# Patient Record
Sex: Female | Born: 1965
Health system: Southern US, Community
[De-identification: ages and names within clinical notes are randomized; demographics above are authoritative.]

## PROBLEM LIST (undated history)

## (undated) DIAGNOSIS — I10 Essential (primary) hypertension: Secondary | ICD-10-CM

## (undated) DIAGNOSIS — J329 Chronic sinusitis, unspecified: Secondary | ICD-10-CM

## (undated) DIAGNOSIS — M549 Dorsalgia, unspecified: Secondary | ICD-10-CM

---

## 2005-09-30 ENCOUNTER — Emergency Department (HOSPITAL_COMMUNITY): Admission: EM | Admit: 2005-09-30 | Discharge: 2005-10-01 | Payer: Self-pay | Admitting: Emergency Medicine

## 2005-10-03 ENCOUNTER — Emergency Department (HOSPITAL_COMMUNITY): Admission: EM | Admit: 2005-10-03 | Discharge: 2005-10-03 | Payer: Self-pay | Admitting: Emergency Medicine

## 2005-10-05 ENCOUNTER — Inpatient Hospital Stay (HOSPITAL_COMMUNITY): Admission: AD | Admit: 2005-10-05 | Discharge: 2005-10-05 | Payer: Self-pay | Admitting: Obstetrics & Gynecology

## 2005-10-14 ENCOUNTER — Inpatient Hospital Stay (HOSPITAL_COMMUNITY): Admission: AD | Admit: 2005-10-14 | Discharge: 2005-10-14 | Payer: Self-pay | Admitting: Family Medicine

## 2007-05-03 ENCOUNTER — Encounter (INDEPENDENT_AMBULATORY_CARE_PROVIDER_SITE_OTHER): Payer: Self-pay | Admitting: Nurse Practitioner

## 2007-05-03 ENCOUNTER — Ambulatory Visit: Payer: Self-pay | Admitting: *Deleted

## 2007-05-03 ENCOUNTER — Ambulatory Visit: Payer: Self-pay | Admitting: Internal Medicine

## 2007-05-03 LAB — CONVERTED CEMR LAB
Bilirubin Urine: NEGATIVE
Hemoglobin, Urine: NEGATIVE
Ketones, ur: NEGATIVE mg/dL
Leukocytes, UA: NEGATIVE
Nitrite: NEGATIVE
Protein, ur: NEGATIVE mg/dL
Specific Gravity, Urine: 1.01
Urine Glucose: NEGATIVE mg/dL
Urobilinogen, UA: 0.2
pH: 7

## 2007-07-29 ENCOUNTER — Telehealth (INDEPENDENT_AMBULATORY_CARE_PROVIDER_SITE_OTHER): Payer: Self-pay | Admitting: *Deleted

## 2007-07-29 ENCOUNTER — Encounter (INDEPENDENT_AMBULATORY_CARE_PROVIDER_SITE_OTHER): Payer: Self-pay | Admitting: Nurse Practitioner

## 2007-08-02 ENCOUNTER — Encounter (INDEPENDENT_AMBULATORY_CARE_PROVIDER_SITE_OTHER): Payer: Self-pay | Admitting: Nurse Practitioner

## 2007-08-02 DIAGNOSIS — M545 Low back pain, unspecified: Secondary | ICD-10-CM | POA: Insufficient documentation

## 2008-05-04 ENCOUNTER — Emergency Department (HOSPITAL_COMMUNITY): Admission: EM | Admit: 2008-05-04 | Discharge: 2008-05-04 | Payer: Self-pay | Admitting: Family Medicine

## 2008-06-11 ENCOUNTER — Emergency Department (HOSPITAL_COMMUNITY): Admission: EM | Admit: 2008-06-11 | Discharge: 2008-06-11 | Payer: Self-pay | Admitting: Family Medicine

## 2008-08-11 ENCOUNTER — Emergency Department (HOSPITAL_COMMUNITY): Admission: EM | Admit: 2008-08-11 | Discharge: 2008-08-11 | Payer: Self-pay | Admitting: Emergency Medicine

## 2008-11-09 ENCOUNTER — Emergency Department (HOSPITAL_COMMUNITY): Admission: EM | Admit: 2008-11-09 | Discharge: 2008-11-09 | Payer: Self-pay | Admitting: Emergency Medicine

## 2008-12-07 ENCOUNTER — Emergency Department (HOSPITAL_COMMUNITY): Admission: EM | Admit: 2008-12-07 | Discharge: 2008-12-07 | Payer: Self-pay | Admitting: Emergency Medicine

## 2008-12-15 ENCOUNTER — Ambulatory Visit: Payer: Self-pay | Admitting: Nurse Practitioner

## 2008-12-15 DIAGNOSIS — M19019 Primary osteoarthritis, unspecified shoulder: Secondary | ICD-10-CM | POA: Insufficient documentation

## 2008-12-15 DIAGNOSIS — I10 Essential (primary) hypertension: Secondary | ICD-10-CM | POA: Insufficient documentation

## 2008-12-26 ENCOUNTER — Ambulatory Visit (HOSPITAL_COMMUNITY): Admission: RE | Admit: 2008-12-26 | Discharge: 2008-12-26 | Payer: Self-pay | Admitting: Nurse Practitioner

## 2010-12-31 ENCOUNTER — Ambulatory Visit (INDEPENDENT_AMBULATORY_CARE_PROVIDER_SITE_OTHER): Payer: Self-pay

## 2010-12-31 ENCOUNTER — Inpatient Hospital Stay (INDEPENDENT_AMBULATORY_CARE_PROVIDER_SITE_OTHER)
Admission: RE | Admit: 2010-12-31 | Discharge: 2010-12-31 | Disposition: A | Discharge status: Home / Self Care | Payer: Self-pay | Source: Ambulatory Visit | Attending: Family Medicine | Admitting: Family Medicine

## 2010-12-31 DIAGNOSIS — J069 Acute upper respiratory infection, unspecified: Secondary | ICD-10-CM

## 2011-10-18 ENCOUNTER — Emergency Department (HOSPITAL_COMMUNITY)
Admission: EM | Admit: 2011-10-18 | Discharge: 2011-10-18 | Disposition: A | Discharge status: Home / Self Care | Payer: Self-pay | Source: Home / Self Care | Attending: Emergency Medicine | Admitting: Emergency Medicine

## 2011-10-18 ENCOUNTER — Encounter: Payer: Self-pay | Admitting: *Deleted

## 2011-10-18 DIAGNOSIS — I10 Essential (primary) hypertension: Secondary | ICD-10-CM

## 2011-10-18 DIAGNOSIS — J329 Chronic sinusitis, unspecified: Secondary | ICD-10-CM

## 2011-10-18 HISTORY — DX: Chronic sinusitis, unspecified: J32.9

## 2011-10-18 HISTORY — DX: Essential (primary) hypertension: I10

## 2011-10-18 HISTORY — DX: Dorsalgia, unspecified: M54.9

## 2011-10-18 MED ORDER — GUAIFENESIN ER 600 MG PO TB12: 600.0000 mg | ORAL_TABLET | Freq: Two times a day (BID) | ORAL | Status: AC

## 2011-10-18 MED ORDER — IBUPROFEN 600 MG PO TABS: 600.0000 mg | ORAL_TABLET | Freq: Four times a day (QID) | ORAL | Status: AC | PRN

## 2011-10-18 MED ORDER — FLUTICASONE PROPIONATE 50 MCG/ACT NA SUSP: 2.0000 | Freq: Every day | NASAL | Status: DC

## 2011-10-18 MED ORDER — DOXYCYCLINE HYCLATE 100 MG PO CAPS: 100.0000 mg | ORAL_CAPSULE | Freq: Two times a day (BID) | ORAL | Status: AC

## 2011-10-18 NOTE — ED Notes (Signed)
Pt is here with complaints of 5 day history of non-productive cough, fever, HA, runny nose and eye drainage.

## 2011-10-18 NOTE — ED Provider Notes (Addendum)
History     CSN: [id number]  Arrival date & time 10/18/11  1334   First MD Initiated Contact with Patient 10/18/11 1409      Chief Complaint  Patient presents with  . Headache  . Cough  . Nasal Congestion  . Fever    HPI Comments: Pt with nasal congestion, postnasal drip, ST, nonproductive cough, bilateral  frontal sinus pain/pressure worse with bending forward/lying down x 5 days. C/o constant frontal nonradiating HA.  States feels "hot" but no documented fevers at home. No ear fullness. No  N/V, other HA, bodyaches, dental pain, purulent nasal d/c. Took 200 mg motrin yesterday with some relief. States feels simlar to prev episodes of sinusitis.    The history is provided by the patient and the spouse. The history is limited by a language barrier. No language interpreter was used.    Past Medical History  Diagnosis Date  . Sinusitis   . Hypertension   . Back pain     Past Surgical History  Procedure Date  . Cesarean section     History reviewed. No pertinent family history.  History  Substance Use Topics  . Smoking status: Never Smoker   . Smokeless tobacco: Not on file  . Alcohol Use: No    OB History    Grav Para Term Preterm Abortions TAB SAB Ect Mult Living                  Review of Systems  Constitutional: Negative for fever.  HENT: Positive for congestion, sore throat, rhinorrhea, postnasal drip and sinus pressure. Negative for ear pain, nosebleeds, trouble swallowing, dental problem and voice change.   Eyes: Negative for photophobia.  Respiratory: Positive for cough. Negative for shortness of breath and wheezing.   Gastrointestinal: Negative for nausea and vomiting.  Musculoskeletal: Negative for myalgias.  Skin: Negative for rash.  Neurological: Positive for headaches. Negative for speech difficulty, weakness and numbness.    Allergies  Review of patient's allergies indicates no known allergies.  Home Medications   Current Outpatient Rx    Name Route Sig Dispense Refill  . DOXYCYCLINE HYCLATE 100 MG PO CAPS Oral Take 1 capsule (100 mg total) by mouth 2 (two) times daily. X 10 days 20 capsule 0  . FLUTICASONE PROPIONATE 50 MCG/ACT NA SUSP Nasal Place 2 sprays into the nose daily. 16 g 0  . GUAIFENESIN ER 600 MG PO TB12 Oral Take 1 tablet (600 mg total) by mouth 2 (two) times daily. 14 tablet 0  . IBUPROFEN 600 MG PO TABS Oral Take 1 tablet (600 mg total) by mouth every 6 (six) hours as needed for pain. 30 tablet 0    BP 161/85  Pulse 75  Temp(Src) 98.1 F (36.7 C) (Oral)  Resp 18  SpO2 100%  LMP 06/04/2011  Physical Exam  Nursing note and vitals reviewed. Constitutional: She is oriented to person, place, and time. She appears well-developed and well-nourished. No distress.  HENT:  Head: Normocephalic and atraumatic.  Right Ear: Tympanic membrane normal.  Left Ear: Tympanic membrane normal.  Nose: Mucosal edema and rhinorrhea present. No epistaxis. Right sinus exhibits maxillary sinus tenderness and frontal sinus tenderness. Left sinus exhibits maxillary sinus tenderness and frontal sinus tenderness.  Mouth/Throat: Oropharynx is clear and moist and mucous membranes are normal.  Eyes: EOM are normal. Pupils are equal, round, and reactive to light.  Neck: Normal range of motion.  Cardiovascular: Normal rate, regular rhythm, normal heart sounds and intact distal pulses.  Pulmonary/Chest: Effort normal and breath sounds normal.  Abdominal: Bowel sounds are normal. She exhibits no distension.  Musculoskeletal: Normal range of motion.  Lymphadenopathy:    She has no cervical adenopathy.  Neurological: She is alert and oriented to person, place, and time.  Skin: Skin is warm and dry.  Psychiatric: She has a normal mood and affect. Her behavior is normal. Judgment and thought content normal.    ED Course  Procedures (including critical care time)  Labs Reviewed - No data to display No results found.   1. Sinusitis    2. Hypertension       MDM  Previous chart reviewed. Seen in jan 2012 for HTN BP 140's/100. Was sent home with HCTZ/.lisinopril. Pt states ran out of this medication and has not taken it since early Feb. Pt hypertensive today. Pt has no evidence of end organ damage. Pt denies any CNS type sx such as visual changes, focal paresis, or new onset seizure activity. Pt denies any CV sx such as CP, dyspnea, palpitations, pedal edema, tearing pain radiating to back or abd. Pt denied any renal sx such as anuria or hematuria. Pt denies illicit drug use, most notably cocaine, or recent use of OTC medications such as nasal decongestants. Discussed importance of lifestyle modifications as important first steps, and the importance of taking  BP medications as rx'd to her by her PMD. Discussed this extensively with pt and husband. Pt to f/u as OP.    txing sinusitis with flonase, mucinex, saline nasal irrigation. will send home with abx but advised to not fill unless has had this for 10 days, does not get better with current mgmt or  develops a fever. Pt and husband agree with plan   Luiz Blare, MD 10/18/11 1610  Luiz Blare, MD 10/18/11 2303

## 2012-10-15 ENCOUNTER — Telehealth (HOSPITAL_COMMUNITY): Payer: Self-pay

## 2012-10-15 ENCOUNTER — Emergency Department (HOSPITAL_COMMUNITY)
Admission: EM | Admit: 2012-10-15 | Discharge: 2012-10-15 | Disposition: A | Discharge status: Home / Self Care | Payer: Self-pay | Source: Home / Self Care

## 2012-10-15 ENCOUNTER — Encounter (HOSPITAL_COMMUNITY): Payer: Self-pay

## 2012-10-15 DIAGNOSIS — I10 Essential (primary) hypertension: Secondary | ICD-10-CM

## 2012-10-15 DIAGNOSIS — J309 Allergic rhinitis, unspecified: Secondary | ICD-10-CM

## 2012-10-15 LAB — COMPREHENSIVE METABOLIC PANEL
AST: 18 U/L (ref 0–37)
Albumin: 4 g/dL (ref 3.5–5.2)
Alkaline Phosphatase: 75 U/L (ref 39–117)
BUN: 10 mg/dL (ref 6–23)
CO2: 30 mEq/L (ref 19–32)
Calcium: 10.4 mg/dL (ref 8.4–10.5)
Chloride: 99 mEq/L (ref 96–112)
Creatinine, Ser: 0.61 mg/dL (ref 0.50–1.10)
GFR calc Af Amer: >90 mL/min (ref >90)
GFR calc non Af Amer: >90 mL/min (ref >90)
Glucose, Bld: 91 mg/dL (ref 70–99)
Potassium: 3.5 mEq/L (ref 3.5–5.1)
Total Bilirubin: 0.2 mg/dL — ABNORMAL LOW (ref 0.3–1.2)
Total Protein: 8 g/dL (ref 6.0–8.3)

## 2012-10-15 MED ORDER — LORATADINE 10 MG PO TABS: 10.0000 mg | ORAL_TABLET | Freq: Every day | ORAL | Status: DC

## 2012-10-15 MED ORDER — LISINOPRIL-HYDROCHLOROTHIAZIDE 10-12.5 MG PO TABS: 1.0000 | ORAL_TABLET | Freq: Every day | ORAL | Status: DC

## 2012-10-15 NOTE — ED Notes (Signed)
Patient states has been running a fever since yesterday. Has cough congestion has vomited twice at night currently using OTC cough and flu medications with little relief

## 2012-10-15 NOTE — ED Provider Notes (Signed)
History     CSN: [id number]  Arrival date & time 10/15/12  1018   First MD Initiated Contact with Patient 10/15/12 1115      Chief Complaint  Patient presents with  . Fever    (Consider location/radiation/quality/duration/timing/severity/associated sxs/prior treatment) HPI 46 year old female who came to the clinic today with chief complaint of upper respiratory symptoms with stuffy nose and cough. She admits to having fever but temperature today is 98.4. Patient's blood pressure is elevated to 162 /103. She has not been taking her medications as she is noncompliant . Last note from 2012 show that she was prescribed lisinopril/hydrochlorothiazide but patient says that she stopped taking it after one month. She denies any chest pain admits to having some shortness of breath. Past Medical History  Diagnosis Date  . Sinusitis   . Hypertension   . Back pain     Past Surgical History  Procedure Date  . Cesarean section     No family history on file.  History  Substance Use Topics  . Smoking status: Never Smoker   . Smokeless tobacco: Not on file  . Alcohol Use: No    OB History    Grav Para Term Preterm Abortions TAB SAB Ect Mult Living                  Review of Systems Chest: Chest pain, posterior shortness of breath GI: Episode of nausea, vomiting last night HEENT: See history of present illness Allergies  Review of patient's allergies indicates no known allergies.  Home Medications   Current Outpatient Rx  Name  Route  Sig  Dispense  Refill  . FLUTICASONE PROPIONATE 50 MCG/ACT NA SUSP   Nasal   Place 2 sprays into the nose daily.   16 g   0   . GUAIFENESIN ER 600 MG PO TB12   Oral   Take 1 tablet (600 mg total) by mouth 2 (two) times daily.   14 tablet   0   . LISINOPRIL-HYDROCHLOROTHIAZIDE 10-12.5 MG PO TABS   Oral   Take 1 tablet by mouth daily.   30 tablet   2   . LORATADINE 10 MG PO TABS   Oral   Take 1 tablet (10 mg total) by mouth  daily.   30 tablet   0     BP 162/103  Pulse 78  Temp 98.4 F (36.9 C) (Oral)  Resp 21  SpO2 100%  Physical Exam  Constitutional:   Patient is a well-developed and well-nourished female him in no acute distress and cooperative with exam. Head: Normocephalic and atraumatic Mouth: Mucus membranes moist Eyes: PERRL, EOMI, conjunctivae normal Cardiovascular: RRR, S1 normal, S2 normal Pulmonary/Chest: CTAB, no wheezes, rales, or rhonchi Abdominal: Soft. Non-tender, non-distended, bowel sounds are normal, no masses, organomegaly, or guarding present.  Neurological: A&O x3, Strenght is normal and symmetric bilaterally, cranial nerve II-XII are grossly intact, no focal motor deficit, sensory intact to light touch bilaterally.  Extremities : No Cyanosis, Clubbing or Edema   ED Course  Procedures (including critical care time)   Labs Reviewed  CBC WITH DIFFERENTIAL  COMPREHENSIVE METABOLIC PANEL   No results found.   1. HYPERTENSION, BENIGN ESSENTIAL   2. Allergic rhinitis    Hypertension  Patient seems to be noncompliant as she has not taken the medication for last one year. Will restart her back on lisinopril/hydrochlorothiazide at those of 10/2.5 mg by mouth daily. We'll also obtain CMP and CBC and will see the  patient back in one week for the followup of lab results and hypertension.  Allergic rhinitis Patient at this time seems to have allergic rhinitis. We'll prescribe Claritin 10 mg by mouth daily.  Fever Patient was afebrile in the clinic today, however obtain CBC and inform the patient if CBC is elevated. Patient was unable to tell how high was a fever, and only has a feeling of subjective fever. She does not appear toxic at this time.  We'll see her back in one week   MDM          Meredeth Ide, MD 10/15/12 1144

## 2012-10-18 LAB — CBC WITH DIFFERENTIAL/PLATELET
Eosinophils Relative: 3 % (ref 0–5)
HCT: 38.1 % (ref 36.0–46.0)
Hemoglobin: 12.3 g/dL (ref 12.0–15.0)
Lymphocytes Relative: 46 % (ref 12–46)
Lymphs Abs: 1.8 10*3/uL (ref 0.7–4.0)
MCH: 25.7 pg — ABNORMAL LOW (ref 26.0–34.0)
MCHC: 32.3 g/dL (ref 30.0–36.0)
MCV: 79.5 fL (ref 78.0–100.0)
Monocytes Absolute: 0.2 10*3/uL (ref 0.1–1.0)
Monocytes Relative: 5 % (ref 3–12)
Neutro Abs: 1.7 10*3/uL (ref 1.7–7.7)
Platelets: 257 10*3/uL (ref 150–400)
RBC: 4.79 MIL/uL (ref 3.87–5.11)
RDW: 14.4 % (ref 11.5–15.5)
WBC: 3.9 10*3/uL — ABNORMAL LOW (ref 4.0–10.5)

## 2012-10-18 NOTE — ED Notes (Signed)
Pt returned to office today for re draw of cbc her tube had clotted before it had gotten to lab

## 2012-10-29 ENCOUNTER — Encounter (HOSPITAL_COMMUNITY): Payer: Self-pay

## 2012-10-29 ENCOUNTER — Emergency Department (INDEPENDENT_AMBULATORY_CARE_PROVIDER_SITE_OTHER)
Admission: EM | Admit: 2012-10-29 | Discharge: 2012-10-29 | Disposition: A | Discharge status: Home / Self Care | Payer: Self-pay | Source: Home / Self Care

## 2012-10-29 DIAGNOSIS — R05 Cough: Secondary | ICD-10-CM

## 2012-10-29 DIAGNOSIS — J329 Chronic sinusitis, unspecified: Secondary | ICD-10-CM

## 2012-10-29 DIAGNOSIS — J309 Allergic rhinitis, unspecified: Secondary | ICD-10-CM

## 2012-10-29 DIAGNOSIS — H6590 Unspecified nonsuppurative otitis media, unspecified ear: Secondary | ICD-10-CM

## 2012-10-29 DIAGNOSIS — I10 Essential (primary) hypertension: Secondary | ICD-10-CM

## 2012-10-29 MED ORDER — FLUTICASONE PROPIONATE 50 MCG/ACT NA SUSP: 2.0000 | Freq: Every day | NASAL | Status: DC

## 2012-10-29 MED ORDER — DEXTROMETHORPHAN POLISTIREX 30 MG/5ML PO LQCR: 60.0000 mg | ORAL | Status: DC | PRN

## 2012-10-29 MED ORDER — CETIRIZINE HCL 10 MG PO TABS: 10.0000 mg | ORAL_TABLET | Freq: Every day | ORAL | Status: DC

## 2012-10-29 MED ORDER — AMOXICILLIN 875 MG PO TABS: 875.0000 mg | ORAL_TABLET | Freq: Two times a day (BID) | ORAL | Status: DC

## 2012-10-29 NOTE — ED Provider Notes (Signed)
History   CSN: [id number]  Arrival date & time 10/29/12  1043  Chief Complaint  Patient presents with  . URI   Patient is a 47 y.o. female presenting with URI.  URI The primary symptoms include fever, ear pain and cough.  Symptoms associated with the illness include congestion and rhinorrhea.   Pt having persistent cough and congestion, chest and nasal, yellow sputum production, sinus headache, blood tinged sputum, fever, some malaise and weakness, no nausea or vomiting or chest pain.    Past Medical History  Diagnosis Date  . Sinusitis   . Hypertension   . Back pain     Past Surgical History  Procedure Date  . Cesarean section    No family history on file.  History  Substance Use Topics  . Smoking status: Never Smoker   . Smokeless tobacco: Not on file  . Alcohol Use: No    OB History    Grav Para Term Preterm Abortions TAB SAB Ect Mult Living                 Review of Systems  Constitutional: Positive for fever.  HENT: Positive for ear pain, congestion, rhinorrhea, sneezing and postnasal drip.   Respiratory: Positive for cough.   All other systems reviewed and are negative.   Allergies  Review of patient's allergies indicates no known allergies.  Home Medications   Current Outpatient Rx  Name  Route  Sig  Dispense  Refill  . FLUTICASONE PROPIONATE 50 MCG/ACT NA SUSP   Nasal   Place 2 sprays into the nose daily.   16 g   0   . LISINOPRIL-HYDROCHLOROTHIAZIDE 10-12.5 MG PO TABS   Oral   Take 1 tablet by mouth daily.   30 tablet   2   . LORATADINE 10 MG PO TABS   Oral   Take 1 tablet (10 mg total) by mouth daily.   30 tablet   0     BP 149/71  Pulse 70  Temp 98.2 F (36.8 C) (Oral)  Resp 19  SpO2 100%  Physical Exam  Nursing note and vitals reviewed. Constitutional: She is oriented to person, place, and time. She appears well-developed and well-nourished.  HENT:  Head: Normocephalic and atraumatic.  Right Ear: A middle ear effusion  is present.  Left Ear: A middle ear effusion is present.  Nose: Mucosal edema and rhinorrhea present. Right sinus exhibits frontal sinus tenderness. Left sinus exhibits frontal sinus tenderness.  Neck: Normal range of motion. Neck supple. No thyromegaly present.  Cardiovascular: Normal rate, regular rhythm and normal heart sounds.   Pulmonary/Chest: Effort normal. No respiratory distress. She has no wheezes. She has no rales.  Abdominal: Soft. Bowel sounds are normal.  Musculoskeletal: Normal range of motion.  Neurological: She is alert and oriented to person, place, and time.  Skin: Skin is warm and dry.  Psychiatric: She has a normal mood and affect. Her behavior is normal. Judgment and thought content normal.    ED Course  Procedures (including critical care time)  Labs Reviewed - No data to display No results found.  No diagnosis found.  MDM  IMPRESSION  Sinusitis  Allergic Rhinitis uncontrolled  Cough and chest congestion  HTN suboptimally controlled  RECOMMENDATIONS / PLAN Amoxicillin 875 mg po bid flonase Nasal Spray  Start cetirizine 10 mg daily  Stop taking loratadine I reviewed lab work results with patient   FOLLOW UP 3 weeks followup blood pressure  The patient was given  clear instructions to go to ER or return to medical center if symptoms don't improve, worsen or new problems develop.  The patient verbalized understanding.  The patient was told to call to get lab results if they haven't heard anything in the next week.            Cleora Fleet, MD 10/29/12 [phone]

## 2012-10-29 NOTE — ED Notes (Signed)
Complain of cold cough fever increased mucus.  Re check blood pressure as well.

## 2012-11-23 ENCOUNTER — Emergency Department (INDEPENDENT_AMBULATORY_CARE_PROVIDER_SITE_OTHER)
Admission: EM | Admit: 2012-11-23 | Discharge: 2012-11-23 | Disposition: A | Discharge status: Home / Self Care | Payer: No Typology Code available for payment source | Source: Home / Self Care | Attending: Family Medicine | Admitting: Family Medicine

## 2012-11-23 ENCOUNTER — Encounter (HOSPITAL_COMMUNITY): Payer: Self-pay

## 2012-11-23 DIAGNOSIS — I10 Essential (primary) hypertension: Secondary | ICD-10-CM

## 2012-11-23 DIAGNOSIS — M1990 Unspecified osteoarthritis, unspecified site: Secondary | ICD-10-CM

## 2012-11-23 DIAGNOSIS — J309 Allergic rhinitis, unspecified: Secondary | ICD-10-CM

## 2012-11-23 DIAGNOSIS — M19019 Primary osteoarthritis, unspecified shoulder: Secondary | ICD-10-CM

## 2012-11-23 MED ORDER — NAPROXEN 500 MG PO TABS: ORAL_TABLET | ORAL | Status: DC

## 2012-11-23 MED ORDER — LISINOPRIL-HYDROCHLOROTHIAZIDE 10-12.5 MG PO TABS: 1.0000 | ORAL_TABLET | Freq: Every day | ORAL | Status: DC

## 2012-11-23 NOTE — ED Notes (Signed)
Follow up HTN 

## 2012-11-23 NOTE — ED Provider Notes (Signed)
History     CSN: [id number]  Arrival date & time 11/23/12  1516   First MD Initiated Contact with Patient 11/23/12 1541      Chief Complaint  Patient presents with  . Follow-up    (Consider location/radiation/quality/duration/timing/severity/associated sxs/prior treatment) HPI Patient is presenting today to followup and reporting complaints of arthritis pain in the hands and knees.  It is exacerbated over the cold weather that we have had recently in the area.  The patient reports that she has not taken her blood pressure medication because she's out of it and needs a refill today.  Past Medical History  Diagnosis Date  . Sinusitis   . Hypertension   . Back pain     Past Surgical History  Procedure Date  . Cesarean section     No family history on file.  History  Substance Use Topics  . Smoking status: Never Smoker   . Smokeless tobacco: Not on file  . Alcohol Use: No    OB History    Grav Para Term Preterm Abortions TAB SAB Ect Mult Living                  Review of Systems  Constitutional: Positive for fatigue.  Musculoskeletal: Positive for arthralgias.  Neurological: Positive for weakness.  All other systems reviewed and are negative.    Allergies  Review of patient's allergies indicates no known allergies.  Home Medications   Current Outpatient Rx  Name  Route  Sig  Dispense  Refill  . AMOXICILLIN 875 MG PO TABS   Oral   Take 1 tablet (875 mg total) by mouth 2 (two) times daily.   20 tablet   0   . CETIRIZINE HCL 10 MG PO TABS   Oral   Take 1 tablet (10 mg total) by mouth daily.   30 tablet   3   . DEXTROMETHORPHAN POLISTIREX ER 30 MG/5ML PO LQCR   Oral   Take 10 mLs (60 mg total) by mouth as needed for cough.   89 mL   0   . FLUTICASONE PROPIONATE 50 MCG/ACT NA SUSP   Nasal   Place 2 sprays into the nose daily.   16 g   3   . LISINOPRIL-HYDROCHLOROTHIAZIDE 10-12.5 MG PO TABS   Oral   Take 1 tablet by mouth daily.   30  tablet   2     BP 158/91  Pulse 71  Temp 97.5 F (36.4 C) (Oral)  SpO2 100%  Physical Exam  Nursing note and vitals reviewed. Constitutional: She is oriented to person, place, and time. She appears well-developed and well-nourished. No distress.  HENT:  Head: Normocephalic and atraumatic.  Eyes: Conjunctivae normal and EOM are normal. Pupils are equal, round, and reactive to light.  Neck: Normal range of motion. Neck supple. No JVD present. No thyromegaly present.  Cardiovascular: Normal rate, regular rhythm and normal heart sounds.   Pulmonary/Chest: Effort normal and breath sounds normal.  Abdominal: Soft. Bowel sounds are normal.  Musculoskeletal: Normal range of motion.  Lymphadenopathy:    She has no cervical adenopathy.  Neurological: She is alert and oriented to person, place, and time.  Skin: Skin is warm and dry.  Psychiatric: She has a normal mood and affect. Her behavior is normal. Judgment and thought content normal.    ED Course  Procedures (including critical care time)  Labs Reviewed - No data to display No results found.  No diagnosis found.  MDM  IMPRESSION  URI - resolved now  Allergic Rhinitis  Osteoarthritis, knees, hands   RECOMMENDATIONS / PLAN Naproxen 500 mg po every 12 hours prn arthritis pain Refill blood pressure medications Reviewed labs from December 2013 with patient today Refill Zestoretic for blood pressure I asked the patient to check her blood pressure at home and to call our office with her blood pressure readings in the next 2 weeks.  The patient verbalized understanding.  FOLLOW UP 3 months   The patient was given clear instructions to go to ER or return to medical center if symptoms don't improve, worsen or new problems develop.  The patient verbalized understanding.  The patient was told to call to get lab results if they haven't heard anything in the next week.            Cleora Fleet, MD 11/23/12 [phone]

## 2013-02-04 ENCOUNTER — Emergency Department (HOSPITAL_COMMUNITY)
Admission: EM | Admit: 2013-02-04 | Discharge: 2013-02-04 | Disposition: A | Discharge status: Home Health | Payer: BC Managed Care – PPO | Source: Home / Self Care

## 2013-02-04 ENCOUNTER — Encounter (HOSPITAL_COMMUNITY): Payer: Self-pay

## 2013-02-04 DIAGNOSIS — M19019 Primary osteoarthritis, unspecified shoulder: Secondary | ICD-10-CM

## 2013-02-04 DIAGNOSIS — I10 Essential (primary) hypertension: Secondary | ICD-10-CM

## 2013-02-04 MED ORDER — NAPROXEN 500 MG PO TABS: ORAL_TABLET | ORAL | Status: DC

## 2013-02-04 MED ORDER — LISINOPRIL-HYDROCHLOROTHIAZIDE 10-12.5 MG PO TABS: 1.0000 | ORAL_TABLET | Freq: Every day | ORAL | Status: DC

## 2013-02-04 NOTE — ED Notes (Signed)
Follow up- history of htn

## 2013-02-04 NOTE — ED Provider Notes (Signed)
Patient Demographics  Crystal Whitaker, is a 47 y.o. female  AVW:[id number]  WGN:[id number]  DOB - Aug 03, 1966  Chief Complaint  Patient presents with  . Follow-up        Subjective:   Crystal Whitaker today is here for a follow up visit. She continues to complain of right elbow pain, but no fever. She only uses the naproxen intermittently and has not been using them for the past few days. There is no complaints of redness and swelling in the right elbow. She denies any other complaints. There is no history of shortness of breath, chest pain, headache, nausea, vomiting, abdominal pain.  Objective:    Filed Vitals:   02/04/13 1306  BP: 110/47  Pulse: 73  Temp: 97.5 F (36.4 C)  Resp: 18  SpO2: 100%     ALLERGIES:  No Known Allergies  PAST MEDICAL HISTORY: Past Medical History  Diagnosis Date  . Sinusitis   . Hypertension   . Back pain     MEDICATIONS AT HOME: Prior to Admission medications   Medication Sig Start Date End Date Taking? Authorizing Provider  lisinopril-hydrochlorothiazide (PRINZIDE) 10-12.5 MG per tablet Take 1 tablet by mouth daily. 02/04/13   Dezaria Methot Levora Dredge, MD  naproxen (NAPROSYN) 500 MG tablet Take 1 tab po BID with meals prn joint pain 02/04/13   Maretta Bees, MD     Exam  General appearance :Awake, alert, not in any distress. Speech Clear. Not toxic Looking HEENT: Atraumatic and Normocephalic, pupils equally reactive to light and accomodation Neck: supple, no JVD. No cervical lymphadenopathy.  Chest:Good air entry bilaterally, no added sounds  CVS: S1 S2 regular, no murmurs.  Abdomen: Bowel sounds present, Non tender and not distended with no gaurding, rigidity or rebound. Extremities: B/L Lower Ext shows no edema, both legs are warm to touch. The right elbow- no erythema or swelling. No office tenderness. Neurology: Awake alert, and oriented X 3, CN II-XII intact, Non focal Skin:No Rash Wounds:N/A    Data Review   CBC No  results found for this basename: WBC, HGB, HCT, PLT, MCV, MCH, MCHC, RDW, NEUTRABS, LYMPHSABS, MONOABS, EOSABS, BASOSABS, BANDABS, BANDSABD,  in the last 168 hours  Chemistries   No results found for this basename: NA, K, CL, CO2, GLUCOSE, BUN, CREATININE, GFRCGP, CALCIUM, MG, AST, ALT, ALKPHOS, BILITOT,  in the last 168 hours ------------------------------------------------------------------------------------------------------------------ No results found for this basename: HGBA1C,  in the last 72 hours ------------------------------------------------------------------------------------------------------------------ No results found for this basename: CHOL, HDL, LDLCALC, TRIG, CHOLHDL, LDLDIRECT,  in the last 72 hours ------------------------------------------------------------------------------------------------------------------ No results found for this basename: TSH, T4TOTAL, FREET3, T3FREE, THYROIDAB,  in the last 72 hours ------------------------------------------------------------------------------------------------------------------ No results found for this basename: VITAMINB12, FOLATE, FERRITIN, TIBC, IRON, RETICCTPCT,  in the last 72 hours  Coagulation profile  No results found for this basename: INR, PROTIME,  in the last 168 hours    Assessment & Plan   Hypertension - Controlled - Continue with lisinopril and hydrochlorothiazide  Right elbow pain - Suspect osteoarthritis - Check x-ray - Check RA factor and anti-CCP - Continue with as needed naproxen and as needed Tylenol  Please check a right elbow x-ray and RA factor, anti-CCP, ESR next visit- have been ordered   Follow-up Information   Follow up with [facility]. Schedule an appointment as soon as possible for a visit in 1 month.      Maretta Bees, MD 02/04/13 [phone]

## 2013-03-09 ENCOUNTER — Ambulatory Visit: Payer: BC Managed Care – PPO

## 2013-03-11 ENCOUNTER — Ambulatory Visit: Payer: BC Managed Care – PPO | Attending: Family Medicine | Admitting: Family Medicine

## 2013-03-11 VITALS — BP 146/80 | HR 77 | Temp 98.7°F | Resp 17 | Wt 135.0 lb

## 2013-03-11 DIAGNOSIS — M25561 Pain in right knee: Secondary | ICD-10-CM

## 2013-03-11 DIAGNOSIS — M25569 Pain in unspecified knee: Secondary | ICD-10-CM

## 2013-03-11 NOTE — Patient Instructions (Signed)
Arthritis, Nonspecific  Arthritis is pain, redness, warmth, or puffiness (inflammation) of a joint. The joint may be stiff or hurt when you move it. One or more joints may be affected. There are many types of arthritis. Your doctor may not know what type you have right away. The most common cause of arthritis is wear and tear on the joint (osteoarthritis).  HOME CARE   · Only take medicine as told by your doctor.  · Rest the joint as much as possible.  · Raise (elevate) your joint if it is puffy.  · Use crutches if the painful joint is in your leg.  · Drink enough fluids to keep your pee (urine) clear or pale yellow.  · Follow your doctor's diet instructions.  · Use cold packs for very bad joint pain for 10 to 15 minutes every hour. Ask your doctor if it is okay for you to use hot packs.  · Exercise as told by your doctor.  · Take a warm shower if you have stiffness in the morning.  · Move your sore joints throughout the day.  GET HELP RIGHT AWAY IF:   · You have a fever.  · You have very bad joint pain, puffiness, or redness.  · You have many joints that are painful and puffy.  · You are not getting better with treatment.  · You have very bad back pain or leg weakness.  · You cannot control when you poop (bowel movement) or pee (urinate).  · You do not feel better in 24 hours or are getting worse.  · You are having side effects from your medicine.  MAKE SURE YOU:   · Understand these instructions.  · Will watch your condition.  · Will get help right away if you are not doing well or get worse.  Document Released: 12/31/2009 Document Revised: 04/06/2012 Document Reviewed: 12/31/2009  [employer]® Patient Information ©2014 [employer].

## 2013-03-11 NOTE — Progress Notes (Signed)
Follow up HTN 

## 2013-03-11 NOTE — Progress Notes (Signed)
Subjective:     Patient ID: Crystal Whitaker, female   DOB: [birth year], 47 y.o.   MRN: [id number]  HPI Pt here for f/u on right elbow pain which she was seen last month for. At that time, an xray and some lab work was ordered to further evaluate the pain. The pt, however, did not get either done. Today her elbow pain continues but is not as bad as it was last visit. She also has b/l knee pain as well as b/l ankle pain. It is worse in the morning and eases off through the day. She takes naproxen which helps some. The pin is dull and constant.    Review of Systems per hpi     Objective:   Physical Exam  Nursing note and vitals reviewed. Constitutional: She appears well-developed and well-nourished.  Cardiovascular: Normal rate, regular rhythm and normal heart sounds.   Pulmonary/Chest: Effort normal and breath sounds normal.  Musculoskeletal:  Elbow: from, neurovasc intact, no swelling or edema  Knees (b/l) - mild global swelling, mod crepitus, neg balloon sign, neg drawer signs, no ttp. neurovasc intact  Ankles (b/l) - mild swelling laterally, inferior to the laterl maleoli, neurovasc intact. nontender to palp.        Assessment:     Knee pain, bilateral - Plan: DG Knee 1-2 Views Left, DG Knee 1-2 Views Right, Sedimentation Rate, Rheumatoid factor, Cyclic Citrul Peptide Antibody, IGG       Plan:     XRs and labs as above Will see her back in 3 weeks to review labs and xrs as well as check bp Cont naproxen prn  Call/rtc prn prior, call with questions or concerns

## 2013-03-12 LAB — SEDIMENTATION RATE: Sed Rate: 10 mm/hr (ref 0–22)

## 2013-04-01 ENCOUNTER — Ambulatory Visit: Payer: BC Managed Care – PPO | Attending: Family Medicine | Admitting: Internal Medicine

## 2013-04-01 ENCOUNTER — Ambulatory Visit (HOSPITAL_COMMUNITY)
Admission: RE | Admit: 2013-04-01 | Discharge: 2013-04-01 | Disposition: A | Discharge status: Home / Self Care | Payer: BC Managed Care – PPO | Source: Ambulatory Visit | Attending: Internal Medicine | Admitting: Internal Medicine

## 2013-04-01 VITALS — BP 141/84 | HR 69 | Temp 98.0°F | Resp 17

## 2013-04-01 DIAGNOSIS — M25569 Pain in unspecified knee: Secondary | ICD-10-CM

## 2013-04-01 DIAGNOSIS — M25669 Stiffness of unspecified knee, not elsewhere classified: Secondary | ICD-10-CM | POA: Insufficient documentation

## 2013-04-01 DIAGNOSIS — M25561 Pain in right knee: Secondary | ICD-10-CM

## 2013-04-01 NOTE — Progress Notes (Signed)
Patient here for follow up Elbow and hip pain

## 2013-04-01 NOTE — Progress Notes (Signed)
Patient ID: Crystal Whitaker, female   DOB: 08-05-1966, 47 y.o.   MRN: [id number] Patient Demographics  Crystal Whitaker, is a 47 y.o. female  NFA:[id number]  ION:[id number]  DOB - 1965-11-02  Chief Complaint  Patient presents with  . Follow-up        Subjective:   [name] today has, No headache, No chest pain, No abdominal pain - No Nausea, No new weakness tingling or numbness, No Cough - SOB. She comes back to followup on her blood work ordered for right knee pain, blood work looks stable including negative rheumatoid factor, negative CCP antibody, ESR is stable, she forgot to get her x-ray done.  Objective:   Past Medical History  Diagnosis Date  . Sinusitis   . Hypertension   . Back pain       Past Surgical History  Procedure Laterality Date  . Cesarean section       Filed Vitals:   04/01/13 0913  BP: 141/84  Pulse: 69  Temp: 98 F (36.7 C)  Resp: 17  SpO2: 96%     Exam  Awake Alert, Oriented X 3, No new F.N deficits, Normal affect [place].AT,PERRAL Supple Neck,No JVD, No cervical lymphadenopathy appriciated.  Symmetrical Chest wall movement, Good air movement bilaterally, CTAB RRR,No Gallops,Rubs or new Murmurs, No Parasternal Heave +ve B.Sounds, Abd Soft, Non tender, No organomegaly appriciated, No rebound - guarding or rigidity. No Cyanosis, Clubbing or edema, No new Rash or bruise, right knee minimal effusion in the suprapatellar area, no warmth no tenderness, good range of passive motion    Data Review   CBC No results found for this basename: WBC, HGB, HCT, PLT, MCV, MCH, MCHC, RDW, NEUTRABS, LYMPHSABS, MONOABS, EOSABS, BASOSABS, BANDABS, BANDSABD,  in the last 168 hours  Chemistries   No results found for this basename: NA, K, CL, CO2, GLUCOSE, BUN, CREATININE, GFRCGP, CALCIUM, MG, AST, ALT, ALKPHOS, BILITOT,  in the last 168  hours ------------------------------------------------------------------------------------------------------------------ No results found for this basename: HGBA1C,  in the last 72 hours ------------------------------------------------------------------------------------------------------------------ No results found for this basename: CHOL, HDL, LDLCALC, TRIG, CHOLHDL, LDLDIRECT,  in the last 72 hours ------------------------------------------------------------------------------------------------------------------ No results found for this basename: TSH, T4TOTAL, FREET3, T3FREE, THYROIDAB,  in the last 72 hours ------------------------------------------------------------------------------------------------------------------ No results found for this basename: VITAMINB12, FOLATE, FERRITIN, TIBC, IRON, RETICCTPCT,  in the last 72 hours  Coagulation profile  No results found for this basename: INR, PROTIME,  in the last 168 hours     Prior to Admission medications   Medication Sig Start Date End Date Taking? Authorizing Provider  lisinopril-hydrochlorothiazide (PRINZIDE) 10-12.5 MG per tablet Take 1 tablet by mouth daily. 02/04/13   Shanker Levora Dredge, MD  naproxen (NAPROSYN) 500 MG tablet Take 1 tab po BID with meals prn joint pain 02/04/13   Maretta Bees, MD     Assessment & Plan   Post fall right knee pain. Likely soft tissue injury versus some meniscal injury, exam not consistent with infectious or gouty arthritis, lab work is stable, rheumatoid factor and anti-CCP antibody negative, ESR stable, she will continue taking naproxen for pain control, she has forgotten to get an x-ray done which she was supposed to last week, x-rays have been reordered, patient will come back in a week.       Leroy Sea M.D on 04/01/2013 at 9:24 AM

## 2013-04-08 ENCOUNTER — Encounter: Payer: Self-pay | Admitting: Internal Medicine

## 2013-04-08 ENCOUNTER — Ambulatory Visit: Payer: BC Managed Care – PPO | Attending: Family Medicine | Admitting: Internal Medicine

## 2013-04-08 VITALS — BP 122/78 | HR 69 | Temp 98.0°F | Resp 16 | Ht 61.0 in | Wt 133.0 lb

## 2013-04-08 DIAGNOSIS — Z79899 Other long term (current) drug therapy: Secondary | ICD-10-CM | POA: Insufficient documentation

## 2013-04-08 DIAGNOSIS — I10 Essential (primary) hypertension: Secondary | ICD-10-CM | POA: Insufficient documentation

## 2013-04-08 DIAGNOSIS — M25561 Pain in right knee: Secondary | ICD-10-CM

## 2013-04-08 DIAGNOSIS — M25569 Pain in unspecified knee: Secondary | ICD-10-CM | POA: Insufficient documentation

## 2013-04-08 MED ORDER — TRAMADOL-ACETAMINOPHEN 37.5-325 MG PO TABS: 1.0000 | ORAL_TABLET | Freq: Four times a day (QID) | ORAL | Status: DC | PRN

## 2013-04-08 NOTE — Progress Notes (Signed)
Patient here for follow up and to get  Her results of her x ray

## 2013-04-08 NOTE — Progress Notes (Signed)
Patient ID: Crystal Whitaker, female   DOB: 01-07-1966, 47 y.o.   MRN: [id number]  CC:  HPI: 47 year old female [facility] pain. The patient states that she fell a month ago and has had inability to bear weight. Sedimentation Rate, Rheumatoid factor, Cyclic Citrul Peptide Antibody, IGG were all found to be negative. She had plain films done on 6/13 that showed degenerated changes. Patient complains of pain along the right patellar margin, worse when she flexes her knee.Marland Kitchen   No Known Allergies Past Medical History  Diagnosis Date  . Sinusitis   . Hypertension   . Back pain    Current Outpatient Prescriptions on File Prior to Visit  Medication Sig Dispense Refill  . lisinopril-hydrochlorothiazide (PRINZIDE) 10-12.5 MG per tablet Take 1 tablet by mouth daily.  30 tablet  4  . [DISCONTINUED] cetirizine (ZYRTEC) 10 MG tablet Take 1 tablet (10 mg total) by mouth daily.  30 tablet  3  . [DISCONTINUED] fluticasone (FLONASE) 50 MCG/ACT nasal spray Place 2 sprays into the nose daily.  16 g  3  . [DISCONTINUED] loratadine (CLARITIN) 10 MG tablet Take 1 tablet (10 mg total) by mouth daily.  30 tablet  0   No current facility-administered medications on file prior to visit.   History reviewed. No pertinent family history. History   Social History  . Marital Status: Married    Spouse Name: N/A    Number of Children: N/A  . Years of Education: N/A   Occupational History  . Not on file.   Social History Main Topics  . Smoking status: Never Smoker   . Smokeless tobacco: Not on file  . Alcohol Use: No  . Drug Use: No  . Sexually Active:    Other Topics Concern  . Not on file   Social History Narrative  . No narrative on file    Review of Systems  Constitutional: Negative for fever, chills, diaphoresis, activity change, appetite change and fatigue.  HENT: Negative for ear pain, nosebleeds, congestion, facial swelling,  rhinorrhea, neck pain, neck stiffness and ear discharge.   Eyes: Negative for pain, discharge, redness, itching and visual disturbance.  Respiratory: Negative for cough, choking, chest tightness, shortness of breath, wheezing and stridor.   Cardiovascular: Negative for chest pain, palpitations and leg swelling.  Gastrointestinal: Negative for abdominal distention.  Genitourinary: Negative for dysuria, urgency, frequency, hematuria, flank pain, decreased urine volume, difficulty urinating and dyspareunia.  Musculoskeletal: Negative for back pain, joint swelling, arthralgias and gait problem.  Neurological: Negative for dizziness, tremors, seizures, syncope, facial asymmetry, speech difficulty, weakness, light-headedness, numbness and headaches.  Hematological: Negative for adenopathy. Does not bruise/bleed easily.  Psychiatric/Behavioral: Negative for hallucinations, behavioral problems, confusion, dysphoric mood, decreased concentration and agitation.    Objective:   Filed Vitals:   04/08/13 1118  BP: 122/78  Pulse: 69  Temp: 98 F (36.7 C)  Resp: 16    Physical Exam  Constitutional: Appears well-developed and well-nourished. No distress.  HENT: Normocephalic. External right and left ear normal. Oropharynx is clear and moist.  Eyes: Conjunctivae and EOM are normal. PERRLA, no scleral icterus.  Neck: Normal ROM. Neck supple. No JVD. No tracheal deviation. No thyromegaly.  CVS: RRR, S1/S2 +, no murmurs, no gallops, no carotid bruit.  Pulmonary: Effort and breath sounds normal, no stridor, rhonchi, wheezes, rales.  Abdominal: Soft. BS +,  no distension, tenderness, rebound or guarding.  Musculoskeletal: Normal range of motion. No edema and  no tenderness.  Lymphadenopathy: No lymphadenopathy noted, cervical, inguinal. Neuro: Alert. Normal reflexes, muscle tone coordination. No cranial nerve deficit. Skin: Skin is warm and dry. No rash noted. Not diaphoretic. No erythema. No pallor.   Psychiatric: Normal mood and affect. Behavior, judgment, thought content normal.   Lab Results  Component Value Date   WBC 3.9* 10/18/2012   HGB 12.3 10/18/2012   HCT 38.1 10/18/2012   MCV 79.5 10/18/2012   PLT 257 10/18/2012   Lab Results  Component Value Date   CREATININE 0.61 10/15/2012   BUN 10 10/15/2012   NA 140 10/15/2012   K 3.5 10/15/2012   CL 99 10/15/2012   CO2 30 10/15/2012    No results found for this basename: HGBA1C   Lipid Panel  No results found for this basename: chol, trig, hdl, cholhdl, vldl, ldlcalc       Assessment and plan:   Patient Active Problem List   Diagnosis Date Noted  . HYPERTENSION, BENIGN ESSENTIAL 12/15/2008  . DEGENERATIVE JOINT DISEASE, SHOULDER 12/15/2008  . LOW BACK PAIN SYNDROME 08/02/2007       Right knee pain Patient will be scheduled for an MRI Her Naprosyn has been discontinued She has been provided with a prescription for Ultracet She will followup with [place] in 2 weeks and try to get the MRI done before her next visit

## 2013-04-09 ENCOUNTER — Encounter (HOSPITAL_COMMUNITY): Payer: Self-pay | Admitting: Emergency Medicine

## 2013-04-09 ENCOUNTER — Emergency Department (HOSPITAL_COMMUNITY)
Admission: EM | Admit: 2013-04-09 | Discharge: 2013-04-09 | Disposition: A | Discharge status: Home / Self Care | Payer: BC Managed Care – PPO | Attending: Emergency Medicine | Admitting: Emergency Medicine

## 2013-04-09 ENCOUNTER — Other Ambulatory Visit: Payer: Self-pay

## 2013-04-09 ENCOUNTER — Emergency Department (HOSPITAL_COMMUNITY): Payer: BC Managed Care – PPO

## 2013-04-09 DIAGNOSIS — M25569 Pain in unspecified knee: Secondary | ICD-10-CM | POA: Insufficient documentation

## 2013-04-09 DIAGNOSIS — Z3202 Encounter for pregnancy test, result negative: Secondary | ICD-10-CM | POA: Insufficient documentation

## 2013-04-09 DIAGNOSIS — Z789 Other specified health status: Secondary | ICD-10-CM

## 2013-04-09 DIAGNOSIS — R6883 Chills (without fever): Secondary | ICD-10-CM | POA: Insufficient documentation

## 2013-04-09 DIAGNOSIS — R109 Unspecified abdominal pain: Secondary | ICD-10-CM

## 2013-04-09 DIAGNOSIS — Z888 Allergy status to other drugs, medicaments and biological substances status: Secondary | ICD-10-CM | POA: Insufficient documentation

## 2013-04-09 DIAGNOSIS — R1110 Vomiting, unspecified: Secondary | ICD-10-CM

## 2013-04-09 DIAGNOSIS — T50995A Adverse effect of other drugs, medicaments and biological substances, initial encounter: Secondary | ICD-10-CM | POA: Insufficient documentation

## 2013-04-09 DIAGNOSIS — M25561 Pain in right knee: Secondary | ICD-10-CM

## 2013-04-09 DIAGNOSIS — Z8709 Personal history of other diseases of the respiratory system: Secondary | ICD-10-CM | POA: Insufficient documentation

## 2013-04-09 DIAGNOSIS — M549 Dorsalgia, unspecified: Secondary | ICD-10-CM | POA: Insufficient documentation

## 2013-04-09 DIAGNOSIS — R0602 Shortness of breath: Secondary | ICD-10-CM | POA: Insufficient documentation

## 2013-04-09 DIAGNOSIS — R1031 Right lower quadrant pain: Secondary | ICD-10-CM | POA: Insufficient documentation

## 2013-04-09 DIAGNOSIS — I10 Essential (primary) hypertension: Secondary | ICD-10-CM | POA: Insufficient documentation

## 2013-04-09 LAB — COMPREHENSIVE METABOLIC PANEL
ALT: 12 U/L (ref 0–35)
AST: 16 U/L (ref 0–37)
Alkaline Phosphatase: 56 U/L (ref 39–117)
CO2: 28 mEq/L (ref 19–32)
GFR calc Af Amer: >90 mL/min (ref >90)
Glucose, Bld: 132 mg/dL — ABNORMAL HIGH (ref 70–99)
Potassium: 3 mEq/L — ABNORMAL LOW (ref 3.5–5.1)
Sodium: 137 mEq/L (ref 135–145)
Total Protein: 8.1 g/dL (ref 6.0–8.3)

## 2013-04-09 LAB — CBC WITH DIFFERENTIAL/PLATELET
Basophils Absolute: 0 10*3/uL (ref 0.0–0.1)
Eosinophils Absolute: 0 10*3/uL (ref 0.0–0.7)
Lymphocytes Relative: 23 % (ref 12–46)
Lymphs Abs: 0.9 10*3/uL (ref 0.7–4.0)
Neutrophils Relative %: 72 % (ref 43–77)
Platelets: 261 10*3/uL (ref 150–400)
RBC: 4.69 MIL/uL (ref 3.87–5.11)
WBC: 3.8 10*3/uL — ABNORMAL LOW (ref 4.0–10.5)

## 2013-04-09 LAB — POCT PREGNANCY, URINE: Preg Test, Ur: NEGATIVE

## 2013-04-09 MED ORDER — IOHEXOL 300 MG/ML  SOLN
100.0000 mL | Freq: Once | INTRAMUSCULAR | Status: AC | PRN
  Administered 2013-04-09: 100 mL via INTRAVENOUS

## 2013-04-09 MED ORDER — ONDANSETRON HCL 4 MG/2ML IJ SOLN
4.0000 mg | Freq: Once | INTRAMUSCULAR | Status: AC
  Administered 2013-04-09: 4 mg via INTRAVENOUS
  Filled 2013-04-09: qty 2

## 2013-04-09 MED ORDER — IOHEXOL 300 MG/ML  SOLN
50.0000 mL | Freq: Once | INTRAMUSCULAR | Status: AC | PRN
  Administered 2013-04-09: 50 mL via ORAL

## 2013-04-09 MED ORDER — POTASSIUM CHLORIDE CRYS ER 20 MEQ PO TBCR
40.0000 meq | EXTENDED_RELEASE_TABLET | Freq: Once | ORAL | Status: AC
  Administered 2013-04-09: 40 meq via ORAL
  Filled 2013-04-09: qty 2

## 2013-04-09 MED ORDER — SODIUM CHLORIDE 0.9 % IV BOLUS (SEPSIS)
1000.0000 mL | Freq: Once | INTRAVENOUS | Status: AC
  Administered 2013-04-09: 1000 mL via INTRAVENOUS

## 2013-04-09 NOTE — ED Notes (Signed)
Family at bedside. 

## 2013-04-09 NOTE — ED Provider Notes (Signed)
History     CSN: [id number]  Arrival date & time 04/09/13  1119   First MD Initiated Contact with Patient 04/09/13 1122      Chief Complaint  Patient presents with  . Emesis    (Consider location/radiation/quality/duration/timing/severity/associated sxs/prior treatment) HPI Comments: Patient presents to the emergency department with chief complaint of nausea and vomiting. She also endorses abdominal pain. Patient states the pain began yesterday and has persistent until now. The pain is located in her right lower quadrant of the abdomen. She denies seeing any blood in her vomit or stool. She denies any diarrhea. She is concerned that it might be the medication that she was given yesterday for knee pain. She has been taking Ultracet for her knee pain. She states the pain is moderate. The pain does not radiate.   The history is provided by the patient. No language interpreter was used.    Past Medical History  Diagnosis Date  . Sinusitis   . Hypertension   . Back pain     Past Surgical History  Procedure Laterality Date  . Cesarean section      No family history on file.  History  Substance Use Topics  . Smoking status: Never Smoker   . Smokeless tobacco: Not on file  . Alcohol Use: No    OB History   Grav Para Term Preterm Abortions TAB SAB Ect Mult Living                  Review of Systems  Constitutional: Positive for chills. Negative for fever.  Respiratory: Positive for shortness of breath.   Cardiovascular: Negative for chest pain.  Gastrointestinal: Positive for abdominal pain. Negative for nausea, vomiting, diarrhea and constipation.    Allergies  Review of patient's allergies indicates no known allergies.  Home Medications   Current Outpatient Rx  Name  Route  Sig  Dispense  Refill  . lisinopril-hydrochlorothiazide (PRINZIDE) 10-12.5 MG per tablet   Oral   Take 1 tablet by mouth daily.   30 tablet   4   . traMADol-acetaminophen (ULTRACET)  37.5-325 MG per tablet   Oral   Take 1 tablet by mouth every 6 (six) hours as needed for pain.   50 tablet   0     BP 165/88  Pulse 94  Temp(Src) 99 F (37.2 C) (Oral)  Resp 20  SpO2 100%  Physical Exam  Nursing note and vitals reviewed. Constitutional: She is oriented to person, place, and time. She appears well-developed and well-nourished.  HENT:  Head: Normocephalic and atraumatic.  Eyes: Conjunctivae and EOM are normal. Pupils are equal, round, and reactive to light.  Neck: Normal range of motion. Neck supple.  Cardiovascular: Normal rate and regular rhythm.  Exam reveals no gallop and no friction rub.   No murmur heard. Pulmonary/Chest: Effort normal and breath sounds normal. No respiratory distress. She has no wheezes. She has no rales. She exhibits no tenderness.  Abdominal: Soft. Bowel sounds are normal. She exhibits no distension and no mass. There is tenderness. There is no rebound and no guarding.  Tenderness to palpation of the right lower quadrant, no other focal abdominal tenderness  Musculoskeletal: Normal range of motion. She exhibits no edema and no tenderness.  Neurological: She is alert and oriented to person, place, and time.  Skin: Skin is warm and dry.  Psychiatric: She has a normal mood and affect. Her behavior is normal. Judgment and thought content normal.    ED Course  Procedures (including critical care time)  Labs Reviewed  CBC WITH DIFFERENTIAL  COMPREHENSIVE METABOLIC PANEL  LIPASE, BLOOD   Results for orders placed during the hospital encounter of 04/09/13  CBC WITH DIFFERENTIAL      Result Value Range   WBC 3.8 (*) 4.0 - 10.5 K/uL   RBC 4.69  3.87 - 5.11 MIL/uL   Hemoglobin 12.7  12.0 - 15.0 g/dL   HCT 16.1  09.6 - 04.5 %   MCV 81.2  78.0 - 100.0 fL   MCH 27.1  26.0 - 34.0 pg   MCHC 33.3  30.0 - 36.0 g/dL   RDW 40.9  81.1 - 91.4 %   Platelets 261  150 - 400 K/uL   Neutrophils Relative % 72  43 - 77 %   Neutro Abs 2.7  1.7 - 7.7  K/uL   Lymphocytes Relative 23  12 - 46 %   Lymphs Abs 0.9  0.7 - 4.0 K/uL   Monocytes Relative 4  3 - 12 %   Monocytes Absolute 0.2  0.1 - 1.0 K/uL   Eosinophils Relative 1  0 - 5 %   Eosinophils Absolute 0.0  0.0 - 0.7 K/uL   Basophils Relative 1  0 - 1 %   Basophils Absolute 0.0  0.0 - 0.1 K/uL  COMPREHENSIVE METABOLIC PANEL      Result Value Range   Sodium 137  135 - 145 mEq/L   Potassium 3.0 (*) 3.5 - 5.1 mEq/L   Chloride 96  96 - 112 mEq/L   CO2 28  19 - 32 mEq/L   Glucose, Bld 132 (*) 70 - 99 mg/dL   BUN 12  6 - 23 mg/dL   Creatinine, Ser 7.82  0.50 - 1.10 mg/dL   Calcium 9.8  8.4 - 95.6 mg/dL   Total Protein 8.1  6.0 - 8.3 g/dL   Albumin 4.4  3.5 - 5.2 g/dL   AST 16  0 - 37 U/L   ALT 12  0 - 35 U/L   Alkaline Phosphatase 56  39 - 117 U/L   Total Bilirubin 0.4  0.3 - 1.2 mg/dL   GFR calc non Af Amer >90  >90 mL/min   GFR calc Af Amer >90  >90 mL/min  LIPASE, BLOOD      Result Value Range   Lipase 28  11 - 59 U/L  POCT I-STAT TROPONIN I      Result Value Range   Troponin i, poc 0.01  0.00 - 0.08 ng/mL   Comment 3           POCT PREGNANCY, URINE      Result Value Range   Preg Test, Ur NEGATIVE  NEGATIVE   Ct Abdomen Pelvis W Contrast  04/09/2013   *RADIOLOGY REPORT*  Clinical Data: Right lower quadrant tenderness  CT ABDOMEN AND PELVIS WITH CONTRAST  Technique:  Multidetector CT imaging of the abdomen and pelvis was performed following the standard protocol during bolus administration of intravenous contrast.  Contrast: OMNIPAQUE IOHEXOL 300 MG/ML  SOLN  Comparison: None.  Findings: Lung bases are clear.  No pericardial fluid.  No focal hepatic lesion.  The gallbladder, pancreas, spleen, adrenal glands, and kidneys are normal.  The stomach, small bowel, appendix, and cecum are normal.  The transverse colon and left colon are promptly collapsed.  No evidence of obstructing lesion.  The rectosigmoid colon appears normal.  Abdominal aorta is normal caliber.  No  retroperitoneal or periportal lymphadenopathy.  No free fluid the pelvis.  No history of stones bladder stones bladder is distended.  The ovaries and uterus are normal.  No pelvic lymphadenopathy. Review of  bone windows demonstrates no aggressive osseous lesions.  IMPRESSION:  1.  Normal appendix. 2.  No explanation for abdominal pain.   Original Report Authenticated By: Genevive Bi, M.D.   Dg Knee Complete 4 Views Right  04/01/2013   *RADIOLOGY REPORT*  Clinical Data: Larey Seat, knee pain.  RIGHT KNEE - COMPLETE 4+ VIEW  Comparison: None.  Findings:   No fracture or dislocation.  No effusion.  Mild medial compartment joint space narrowing and spurring.  IMPRESSION: Degenerative changes as described.   Original Report Authenticated By: Davonna Belling, M.D.     ED ECG REPORT  I personally interpreted this EKG   Date: 04/09/2013   Rate: 82  Rhythm: normal sinus rhythm  QRS Axis: normal  Intervals: borderline prolonged QT  ST/T Wave abnormalities: normal  Conduction Disutrbances:none  Narrative Interpretation:   Old EKG Reviewed: none available    1. Abdominal  pain, other specified site   2. Knee pain, right   3. Vomiting   4. Drug intolerance       MDM  Patient with nausea, subjective fevers and chills, vomiting, and right lower quadrant tenderness. Will order basic labs, and CT scan of the belly. Have discussed patient with Dr. Silverio Lay, who agrees with the plan.  Very low suspicion for ACS, no chest pain, no SOB.  Labs, CXR, and EKG are reassuring.  3:42 PM Patient states that she is feeling better. CT scan is negative. She is tolerating oral intake. Will discontinue tramadol, as I suspect that this caused the nausea and vomiting. Recommend Tylenol. Will give the patient a knee sleeve. Recommend rice therapy. Followup with orthopedics and primary care provider.  Patient discussed with Dr. Silverio Lay, who agrees with the plan.       Roxy Horseman, PA-C 04/09/13 1543  Roxy Horseman,  PA-C 04/09/13 1544

## 2013-04-09 NOTE — ED Notes (Signed)
Pt in room vomiting. Will notify PA and administer zofran

## 2013-04-09 NOTE — ED Notes (Signed)
Pt states that she took 1 tab of tramadol-acetaminophen and vomited. The medication was prescribed to her as a d/c med when she went to the [facility] and [facility] yesterday.

## 2013-04-10 NOTE — ED Provider Notes (Signed)
Medical screening examination/treatment/procedure(s) were performed by non-physician practitioner and as supervising physician I was immediately available for consultation/collaboration.   Richardean Canal, MD 04/10/13 [id number]

## 2013-04-13 ENCOUNTER — Telehealth: Payer: Self-pay | Admitting: Family Medicine

## 2013-04-13 ENCOUNTER — Ambulatory Visit (HOSPITAL_COMMUNITY): Admission: RE | Admit: 2013-04-13 | Payer: BC Managed Care – PPO | Source: Ambulatory Visit

## 2013-04-13 NOTE — Telephone Encounter (Signed)
I already got her the preauthorize thru [facility] .

## 2013-04-13 NOTE — Telephone Encounter (Signed)
Radiology needs auth for MRI scheduled for 04/13/13.

## 2013-04-15 ENCOUNTER — Ambulatory Visit (HOSPITAL_COMMUNITY): Payer: BC Managed Care – PPO

## 2013-04-19 ENCOUNTER — Ambulatory Visit (HOSPITAL_COMMUNITY)
Admission: RE | Admit: 2013-04-19 | Discharge: 2013-04-19 | Disposition: A | Discharge status: Home / Self Care | Payer: BC Managed Care – PPO | Source: Ambulatory Visit | Attending: Internal Medicine | Admitting: Internal Medicine

## 2013-04-19 DIAGNOSIS — M25561 Pain in right knee: Secondary | ICD-10-CM

## 2013-04-19 DIAGNOSIS — IMO0002 Reserved for concepts with insufficient information to code with codable children: Secondary | ICD-10-CM | POA: Insufficient documentation

## 2013-04-19 DIAGNOSIS — W19XXXA Unspecified fall, initial encounter: Secondary | ICD-10-CM | POA: Insufficient documentation

## 2013-04-19 DIAGNOSIS — S83289A Other tear of lateral meniscus, current injury, unspecified knee, initial encounter: Secondary | ICD-10-CM | POA: Insufficient documentation

## 2013-04-19 DIAGNOSIS — M1710 Unilateral primary osteoarthritis, unspecified knee: Secondary | ICD-10-CM | POA: Insufficient documentation

## 2013-04-21 ENCOUNTER — Ambulatory Visit: Payer: BC Managed Care – PPO | Attending: Family Medicine | Admitting: Family Medicine

## 2013-04-21 VITALS — BP 126/80 | HR 72 | Temp 97.7°F | Resp 16 | Wt 131.8 lb

## 2013-04-21 DIAGNOSIS — M25569 Pain in unspecified knee: Secondary | ICD-10-CM

## 2013-04-21 DIAGNOSIS — E559 Vitamin D deficiency, unspecified: Secondary | ICD-10-CM

## 2013-04-21 MED ORDER — TRAMADOL-ACETAMINOPHEN 37.5-325 MG PO TABS: 1.0000 | ORAL_TABLET | Freq: Four times a day (QID) | ORAL | Status: DC | PRN

## 2013-04-21 NOTE — Progress Notes (Signed)
Patient here for follow up and to get mri results

## 2013-04-21 NOTE — Progress Notes (Signed)
Patient ID: Crystal Whitaker, female   DOB: Dec 15, 1965, 47 y.o.   MRN: [id number]  CC: follow up MRI knee  HPI: Pt reports that she continues to have pain in her knees but is better with medications.  NO further falls.    No Known Allergies Past Medical History  Diagnosis Date  . Sinusitis   . Hypertension   . Back pain    Current Outpatient Prescriptions on File Prior to Visit  Medication Sig Dispense Refill  . lisinopril-hydrochlorothiazide (PRINZIDE) 10-12.5 MG per tablet Take 1 tablet by mouth daily.  30 tablet  4  . traMADol-acetaminophen (ULTRACET) 37.5-325 MG per tablet Take 1 tablet by mouth every 6 (six) hours as needed for pain.  50 tablet  0  . [DISCONTINUED] cetirizine (ZYRTEC) 10 MG tablet Take 1 tablet (10 mg total) by mouth daily.  30 tablet  3  . [DISCONTINUED] fluticasone (FLONASE) 50 MCG/ACT nasal spray Place 2 sprays into the nose daily.  16 g  3  . [DISCONTINUED] loratadine (CLARITIN) 10 MG tablet Take 1 tablet (10 mg total) by mouth daily.  30 tablet  0   No current facility-administered medications on file prior to visit.   No family history on file. History   Social History  . Marital Status: Married    Spouse Name: N/A    Number of Children: N/A  . Years of Education: N/A   Occupational History  . Not on file.   Social History Main Topics  . Smoking status: Never Smoker   . Smokeless tobacco: Not on file  . Alcohol Use: No  . Drug Use: No  . Sexually Active:    Other Topics Concern  . Not on file   Social History Narrative  . No narrative on file    Review of Systems  Constitutional: Negative for fever, chills, diaphoresis, activity change, appetite change and fatigue.  HENT: Negative for ear pain, nosebleeds, congestion, facial swelling, rhinorrhea, neck pain, neck stiffness and ear discharge.   Eyes: Negative for pain, discharge, redness, itching and visual disturbance.  Respiratory: Negative for cough, choking, chest tightness, shortness of  breath, wheezing and stridor.   Cardiovascular: Negative for chest pain, palpitations and leg swelling.  Gastrointestinal: Negative for abdominal distention.  Genitourinary: Negative for dysuria, urgency, frequency, hematuria, flank pain, decreased urine volume, difficulty urinating and dyspareunia.  Musculoskeletal: Negative for back pain, joint swelling, arthralgias and gait problem.  Neurological: Negative for dizziness, tremors, seizures, syncope, facial asymmetry, speech difficulty, weakness, light-headedness, numbness and headaches.  Hematological: Negative for adenopathy. Does not bruise/bleed easily.  Psychiatric/Behavioral: Negative for hallucinations, behavioral problems, confusion, dysphoric mood, decreased concentration and agitation.    Objective:   Filed Vitals:   04/21/13 1237  BP: 126/80  Pulse: 72  Temp: 97.7 F (36.5 C)  Resp: 16    Physical Exam  Constitutional: Appears well-developed and well-nourished. No distress.  HENT: Normocephalic. External right and left ear normal. Oropharynx is clear and moist.  Eyes: Conjunctivae and EOM are normal. PERRLA, no scleral icterus.  Neck: Normal ROM. Neck supple. No JVD. No tracheal deviation. No thyromegaly.  CVS: RRR, S1/S2 +, no murmurs, no gallops, no carotid bruit.  Pulmonary: Effort and breath sounds normal, no stridor, rhonchi, wheezes, rales.  Abdominal: Soft. BS +,  no distension, tenderness, rebound or guarding.  Musculoskeletal: Normal range of motion. No edema and no tenderness.  Lymphadenopathy: No lymphadenopathy noted, cervical, inguinal. Neuro: Alert. Normal reflexes, muscle tone coordination. No cranial nerve deficit. Skin: Skin  is warm and dry. No rash noted. Not diaphoretic. No erythema. No pallor.  Psychiatric: Normal mood and affect. Behavior, judgment, thought content normal.   Lab Results  Component Value Date   WBC 3.8* 04/09/2013   HGB 12.7 04/09/2013   HCT 38.1 04/09/2013   MCV 81.2 04/09/2013    PLT 261 04/09/2013   Lab Results  Component Value Date   CREATININE 0.59 04/09/2013   BUN 12 04/09/2013   NA 137 04/09/2013   K 3.0* 04/09/2013   CL 96 04/09/2013   CO2 28 04/09/2013    No results found for this basename: HGBA1C   Lipid Panel  No results found for this basename: chol, trig, hdl, cholhdl, vldl, ldlcalc       Assessment and plan:   Patient Active Problem List   Diagnosis Date Noted  . Knee pain, acute 04/21/2013  . HYPERTENSION, BENIGN ESSENTIAL 12/15/2008  . DEGENERATIVE JOINT DISEASE, SHOULDER 12/15/2008  . LOW BACK PAIN SYNDROME 08/02/2007   Check Vitamin D level today per family request.  Check CMP today  Referral to orthopedic surgery for knee evaluation   Rx for ultracet for refilled to use as needed for pain in knee  Follow up in 4 months  The patient was given clear instructions to go to ER or return to medical center if symptoms don't improve, worsen or new problems develop.  The patient verbalized understanding.  The patient was told to call to get any lab results if not heard anything in the next week.    Rodney Langton, MD, CDE, FAAFP [facility] [place], [place]

## 2013-04-21 NOTE — Patient Instructions (Signed)
Knee Pain The knee is the complex joint between your thigh and your lower leg. It is made up of bones, tendons, ligaments, and cartilage. The bones that make up the knee are:  The femur in the thigh.  The tibia and fibula in the lower leg.  The patella or kneecap riding in the groove on the lower femur. CAUSES  Knee pain is a common complaint with many causes. A few of these causes are:  Injury, such as:  A ruptured ligament or tendon injury.  Torn cartilage.  Medical conditions, such as:  Gout  Arthritis  Infections  Overuse, over training or overdoing a physical activity. Knee pain can be minor or severe. Knee pain can accompany debilitating injury. Minor knee problems often respond well to self-care measures or get well on their own. More serious injuries may need medical intervention or even surgery. SYMPTOMS The knee is complex. Symptoms of knee problems can vary widely. Some of the problems are:  Pain with movement and weight bearing.  Swelling and tenderness.  Buckling of the knee.  Inability to straighten or extend your knee.  Your knee locks and you cannot straighten it.  Warmth and redness with pain and fever.  Deformity or dislocation of the kneecap. DIAGNOSIS  Determining what is wrong may be very straight forward such as when there is an injury. It can also be challenging because of the complexity of the knee. Tests to make a diagnosis may include:  Your caregiver taking a history and doing a physical exam.  Routine X-rays can be used to rule out other problems. X-rays will not reveal a cartilage tear. Some injuries of the knee can be diagnosed by:  Arthroscopy a surgical technique by which a small video camera is inserted through tiny incisions on the sides of the knee. This procedure is used to examine and repair internal knee joint problems. Tiny instruments can be used during arthroscopy to repair the torn knee cartilage (meniscus).  Arthrography  is a radiology technique. A contrast liquid is directly injected into the knee joint. Internal structures of the knee joint then become visible on X-ray film.  An MRI scan is a non x-ray radiology procedure in which magnetic fields and a computer produce two- or three-dimensional images of the inside of the knee. Cartilage tears are often visible using an MRI scanner. MRI scans have largely replaced arthrography in diagnosing cartilage tears of the knee.  Blood work.  Examination of the fluid that helps to lubricate the knee joint (synovial fluid). This is done by taking a sample out using a needle and a syringe. TREATMENT The treatment of knee problems depends on the cause. Some of these treatments are:  Depending on the injury, proper casting, splinting, surgery or physical therapy care will be needed.  Give yourself adequate recovery time. Do not overuse your joints. If you begin to get sore during workout routines, back off. Slow down or do fewer repetitions.  For repetitive activities such as cycling or running, maintain your strength and nutrition.  Alternate muscle groups. For example if you are a weight lifter, work the upper body on one day and the lower body the next.  Either tight or weak muscles do not give the proper support for your knee. Tight or weak muscles do not absorb the stress placed on the knee joint. Keep the muscles surrounding the knee strong.  Take care of mechanical problems.  If you have flat feet, orthotics or special shoes may help.   See your caregiver if you need help.  Arch supports, sometimes with wedges on the inner or outer aspect of the heel, can help. These can shift pressure away from the side of the knee most bothered by osteoarthritis.  A brace called an "unloader" brace also may be used to help ease the pressure on the most arthritic side of the knee.  If your caregiver has prescribed crutches, braces, wraps or ice, use as directed. The acronym for  this is PRICE. This means protection, rest, ice, compression and elevation.  Nonsteroidal anti-inflammatory drugs (NSAID's), can help relieve pain. But if taken immediately after an injury, they may actually increase swelling. Take NSAID's with food in your stomach. Stop them if you develop stomach problems. Do not take these if you have a history of ulcers, stomach pain or bleeding from the bowel. Do not take without your caregiver's approval if you have problems with fluid retention, heart failure, or kidney problems.  For ongoing knee problems, physical therapy may be helpful.  Glucosamine and chondroitin are over-the-counter dietary supplements. Both may help relieve the pain of osteoarthritis in the knee. These medicines are different from the usual anti-inflammatory drugs. Glucosamine may decrease the rate of cartilage destruction.  Injections of a corticosteroid drug into your knee joint may help reduce the symptoms of an arthritis flare-up. They may provide pain relief that lasts a few months. You may have to wait a few months between injections. The injections do have a small increased risk of infection, water retention and elevated blood sugar levels.  Hyaluronic acid injected into damaged joints may ease pain and provide lubrication. These injections may work by reducing inflammation. A series of shots may give relief for as long as 6 months.  Topical painkillers. Applying certain ointments to your skin may help relieve the pain and stiffness of osteoarthritis. Ask your pharmacist for suggestions. Many over the-counter products are approved for temporary relief of arthritis pain.  In some countries, doctors often prescribe topical NSAID's for relief of chronic conditions such as arthritis and tendinitis. A review of treatment with NSAID creams found that they worked as well as oral medications but without the serious side effects. PREVENTION  Maintain a healthy weight. Extra pounds put  more strain on your joints.  Get strong, stay limber. Weak muscles are a common cause of knee injuries. Stretching is important. Include flexibility exercises in your workouts.  Be smart about exercise. If you have osteoarthritis, chronic knee pain or recurring injuries, you may need to change the way you exercise. This does not mean you have to stop being active. If your knees ache after jogging or playing basketball, consider switching to swimming, water aerobics or other low-impact activities, at least for a few days a week. Sometimes limiting high-impact activities will provide relief.  Make sure your shoes fit well. Choose footwear that is right for your sport.  Protect your knees. Use the proper gear for knee-sensitive activities. Use kneepads when playing volleyball or laying carpet. Buckle your seat belt every time you drive. Most shattered kneecaps occur in car accidents.  Rest when you are tired. SEEK MEDICAL CARE IF:  You have knee pain that is continual and does not seem to be getting better.  SEEK IMMEDIATE MEDICAL CARE IF:  Your knee joint feels hot to the touch and you have a high fever. MAKE SURE YOU:   Understand these instructions.  Will watch your condition.  Will get help right away if you are not   doing well or get worse. Document Released: 08/03/2007 Document Revised: 12/29/2011 Document Reviewed: 08/03/2007 [facility] Patient Information 2014 [place], [place]. Patellofemoral Pain Your exam shows your knee pain is probably due to a problem with the knee cap, the patella. This problem is also called patellofemoral pain, runner's knee, or chondromalacia. Most of the time, this problem is due to overuse of the knee joint. Repeated bending and straightening can irritate the underside of the knee cap. When this happens, activities such as running, walking, climbing, biking or jumping usually produce pain. Pain may also occur after prolonged sitting. Other patellofemoral symptoms  can include joint stiffness, swelling, and a snapping or grinding sensation with movement. Rest and rehabilitation are usually successful in treating this problem. Surgery is rarely needed. Treatment includes correcting any mechanical factors that could hurt the normal working of the knee. This could be weak thigh muscles or foot problems. Avoid repetitive activities of the knee until the pain and other symptoms improve. Apply ice packs over the knee for 20 to 30 minutes every 2 to 4 hours to reduce pain and swelling. Only take over-the-counter or prescription medicines for pain, discomfort, or fever as directed by your caregiver. Knee braces or neoprene sleeves may help reduce irritation. Rehabilitation exercises to strengthen the quad muscle are often prescribed when your symptoms are better. Call your caregiver for a follow-up exam to evaluate your response to treatment. Document Released: 11/13/2004 Document Revised: 12/29/2011 Document Reviewed: 10/06/2005 [facility] Patient Information 2014 [place], [place].

## 2013-04-25 ENCOUNTER — Telehealth: Payer: Self-pay

## 2013-04-25 NOTE — Progress Notes (Signed)
Quick Note:  Vitamin D level came back a little low at 28. Recommend taking OTC vit D [phone] IU caps po daily.  Rodney Langton, MD, CDE, FAAFP [facility] [place], [place]   ______

## 2013-04-25 NOTE — Telephone Encounter (Signed)
Message copied by Lestine Mount on Mon Apr 25, 2013 11:02 AM ------      Message from: Cleora Fleet      Created: Mon Apr 25, 2013  9:19 AM       Vitamin D level came back a little low at 28.  Recommend taking OTC vit D [phone] IU caps po daily.            Rodney Langton, MD, CDE, FAAFP      [facility]      [facility]      [place], [place]        ------

## 2013-04-25 NOTE — Telephone Encounter (Signed)
Patient not available Left message to return our call 

## 2013-04-28 ENCOUNTER — Telehealth: Payer: Self-pay | Admitting: Family Medicine

## 2013-04-28 NOTE — Telephone Encounter (Signed)
Pt returning call from 04/25/13 about lab results.

## 2013-04-29 ENCOUNTER — Telehealth: Payer: Self-pay

## 2013-04-29 NOTE — Telephone Encounter (Signed)
Lab results given Instructed to take OTC vit D

## 2013-05-09 ENCOUNTER — Ambulatory Visit (INDEPENDENT_AMBULATORY_CARE_PROVIDER_SITE_OTHER): Payer: BC Managed Care – PPO | Admitting: Sports Medicine

## 2013-05-09 ENCOUNTER — Encounter: Payer: Self-pay | Admitting: Sports Medicine

## 2013-05-09 VITALS — BP 128/87 | HR 78 | Ht 61.0 in | Wt 131.0 lb

## 2013-05-09 DIAGNOSIS — S83281A Other tear of lateral meniscus, current injury, right knee, initial encounter: Secondary | ICD-10-CM

## 2013-05-09 DIAGNOSIS — S83289A Other tear of lateral meniscus, current injury, unspecified knee, initial encounter: Secondary | ICD-10-CM

## 2013-05-09 NOTE — Patient Instructions (Addendum)
DR Thurston Hole [address] [place]  [phone] THURS AUG 7TH AT 10AM

## 2013-05-10 NOTE — Progress Notes (Signed)
  Subjective:    Patient ID: Crystal Whitaker, female    DOB: 1966-04-07, 47 y.o.   MRN: [id number]  HPI chief complaint: Right knee pain  Very pleasant 47 year old female comes in today complaining of 3 months of right knee pain. Patient's English is limited so a lot of the history was obtained through the help of an interpreter. Patient apparently slipped in her bathroom 3 months ago suffering a twisting injury to the right knee. Since that time she has had worsening lateral knee pain. She describes the pain as sharp in quality and is worse with activity such as twisting. She has not noticed any swelling. No real mechanical symptoms but she is getting a feeling of the knee wanting to give way. Pain improves at rest. She denies problems with his knee in the past. No prior knee surgeries. Denies pain in her groin. No radiating pain down the leg.  Past medical history and current medications are reviewed. No known drug allergies    Review of Systems     Objective:   Physical Exam Well-developed, well-nourished. No acute distress. Awake alert and oriented x3. Vital signs are reviewed  Right knee: Range of motion is 0-130. This is compared to the uninvolved left knee which demonstrates 0-140 of flexion. No effusion. No soft tissue swelling. There is tenderness to palpation along the lateral joint line with a positive McMurray's. No tenderness along the medial joint line. Knee is stable to valgus and varus stressing. Negative Lockman. Negative anterior drawer. Negative posterior drawer appear. Trace patellofemoral crepitus with active extension but a negative patellar compression test. Neurovascularly intact distally. Walking with a slight limp.  MRI scan of the right knee dated April 19 2013 shows a large complex lateral meniscal tear. She has significant chondromalacia patella as well. Ligaments are intact.      Assessment & Plan:  1. Lateral meniscal tear right knee  Patient will be referred  to Dr. Thurston Hole to discuss arthroscopy. I'll defer definitive treatment to Dr. Sherene Sires discretion. Patient will followup with me when necessary.

## 2013-07-29 ENCOUNTER — Ambulatory Visit: Payer: BC Managed Care – PPO | Attending: Internal Medicine | Admitting: Internal Medicine

## 2013-07-29 ENCOUNTER — Encounter: Payer: Self-pay | Admitting: Internal Medicine

## 2013-07-29 VITALS — BP 167/102 | HR 77 | Temp 98.4°F | Resp 16 | Ht 58.66 in | Wt 135.0 lb

## 2013-07-29 DIAGNOSIS — I10 Essential (primary) hypertension: Secondary | ICD-10-CM | POA: Insufficient documentation

## 2013-07-29 MED ORDER — TRAMADOL-ACETAMINOPHEN 37.5-325 MG PO TABS: 1.0000 | ORAL_TABLET | Freq: Four times a day (QID) | ORAL | Status: DC | PRN

## 2013-07-29 MED ORDER — LISINOPRIL-HYDROCHLOROTHIAZIDE 10-12.5 MG PO TABS: 1.0000 | ORAL_TABLET | Freq: Every day | ORAL | Status: DC

## 2013-07-29 NOTE — Progress Notes (Signed)
Pt is here for a f/u visit. Pt is here today to get a refill on her medications.

## 2013-07-29 NOTE — Patient Instructions (Signed)

## 2013-07-29 NOTE — Progress Notes (Signed)
Patient ID: Crystal Whitaker, female   DOB: [birth year], 47 y.o.   MRN: [id number]   CC: follow up  HPI: Patient is 47 year old female who presents for followup he needs refill on her medicines. She denies chest pain or shortness of breath, no recent sicknesses or hospitalizations, no concerns today. She reports feeling well and check blood pressure regularly.  No Known Allergies Past Medical History  Diagnosis Date  . Sinusitis   . Hypertension   . Back pain    Current Outpatient Prescriptions on File Prior to Visit  Medication Sig Dispense Refill  . [DISCONTINUED] cetirizine (ZYRTEC) 10 MG tablet Take 1 tablet (10 mg total) by mouth daily.  30 tablet  3  . [DISCONTINUED] fluticasone (FLONASE) 50 MCG/ACT nasal spray Place 2 sprays into the nose daily.  16 g  3  . [DISCONTINUED] loratadine (CLARITIN) 10 MG tablet Take 1 tablet (10 mg total) by mouth daily.  30 tablet  0   No current facility-administered medications on file prior to visit.   No known family medical history History   Social History  . Marital Status: Married    Spouse Name: N/A    Number of Children: N/A  . Years of Education: N/A   Occupational History  . Not on file.   Social History Main Topics  . Smoking status: Never Smoker   . Smokeless tobacco: Not on file  . Alcohol Use: No  . Drug Use: No  . Sexual Activity:    Other Topics Concern  . Not on file   Social History Narrative  . No narrative on file    Review of Systems  Constitutional: Negative for fever, chills, diaphoresis, activity change, appetite change and fatigue.  HENT: Negative for ear pain, nosebleeds, congestion, facial swelling, rhinorrhea, neck pain, neck stiffness and ear discharge.   Eyes: Negative for pain, discharge, redness, itching and visual disturbance.  Respiratory: Negative for cough, choking, chest tightness, shortness of breath, wheezing and stridor.   Cardiovascular: Negative for chest pain, palpitations and leg  swelling.  Gastrointestinal: Negative for abdominal distention.  Genitourinary: Negative for dysuria, urgency, frequency, hematuria, flank pain, decreased urine volume, difficulty urinating and dyspareunia.  Musculoskeletal: Negative for back pain, joint swelling, arthralgias and gait problem.  Neurological: Negative for dizziness, tremors, seizures, syncope, facial asymmetry, speech difficulty, weakness, light-headedness, numbness and headaches.  Hematological: Negative for adenopathy. Does not bruise/bleed easily.  Psychiatric/Behavioral: Negative for hallucinations, behavioral problems, confusion, dysphoric mood, decreased concentration and agitation.    Objective:   Filed Vitals:   07/29/13 1727  BP: 167/102  Pulse: 77  Temp: 98.4 F (36.9 C)  Resp: 16    Physical Exam  Constitutional: Appears well-developed and well-nourished. No distress.  HENT: Normocephalic. External right and left ear normal. Oropharynx is clear and moist.  Eyes: Conjunctivae and EOM are normal. PERRLA, no scleral icterus.  Neck: Normal ROM. Neck supple. No JVD. No tracheal deviation. No thyromegaly.  CVS: RRR, S1/S2 +, no murmurs, no gallops, no carotid bruit.  Pulmonary: Effort and breath sounds normal, no stridor, rhonchi, wheezes, rales.  Abdominal: Soft. BS +,  no distension, tenderness, rebound or guarding.  Musculoskeletal: Normal range of motion. No edema and no tenderness.  Lymphadenopathy: No lymphadenopathy noted, cervical, inguinal. Neuro: Alert. Normal reflexes, muscle tone coordination. No cranial nerve deficit. Skin: Skin is warm and dry. No rash noted. Not diaphoretic. No erythema. No pallor.  Psychiatric: Normal mood and affect. Behavior, judgment, thought content normal.   Lab Results  Component Value Date   WBC 3.8* 04/09/2013   HGB 12.7 04/09/2013   HCT 38.1 04/09/2013   MCV 81.2 04/09/2013   PLT 261 04/09/2013   Lab Results  Component Value Date   CREATININE 0.59 04/09/2013   BUN  12 04/09/2013   NA 137 04/09/2013   K 3.0* 04/09/2013   CL 96 04/09/2013   CO2 28 04/09/2013    No results found for this basename: HGBA1C   Lipid Panel  No results found for this basename: chol, trig, hdl, cholhdl, vldl, ldlcalc       Assessment and plan:   Patient Active Problem List   Diagnosis Date Noted  . HYPERTENSION, BENIGN ESSENTIAL - blood pressure above the target range, we have discussed target blood pressure 9 the patient to continue checking blood pressure regularly and to call his back if the numbers are higher than 140/90  12/15/2008

## 2013-10-17 ENCOUNTER — Telehealth: Payer: Self-pay

## 2013-11-16 ENCOUNTER — Ambulatory Visit: Payer: BC Managed Care – PPO | Attending: Internal Medicine | Admitting: Internal Medicine

## 2013-11-16 ENCOUNTER — Encounter: Payer: Self-pay | Admitting: Internal Medicine

## 2013-11-16 ENCOUNTER — Ambulatory Visit: Payer: BC Managed Care – PPO

## 2013-11-16 VITALS — BP 164/102 | HR 60 | Temp 97.2°F | Resp 16 | Wt 135.0 lb

## 2013-11-16 DIAGNOSIS — I10 Essential (primary) hypertension: Secondary | ICD-10-CM | POA: Insufficient documentation

## 2013-11-16 DIAGNOSIS — R51 Headache: Secondary | ICD-10-CM | POA: Insufficient documentation

## 2013-11-16 MED ORDER — LISINOPRIL-HYDROCHLOROTHIAZIDE 20-12.5 MG PO TABS: 1.0000 | ORAL_TABLET | Freq: Every day | ORAL | Status: DC

## 2013-11-16 MED ORDER — BUTALBITAL-APAP-CAFFEINE 50-325-40 MG PO TABS: 1.0000 | ORAL_TABLET | Freq: Four times a day (QID) | ORAL | Status: DC | PRN

## 2013-11-16 NOTE — Progress Notes (Signed)
Patient here for headache States started two days ago to the left side of her head Constant throbbing Stated has been taking her blood pressure medication every day

## 2013-11-16 NOTE — Patient Instructions (Signed)
2 Gram Low Sodium Diet A 2 gram sodium diet restricts the amount of sodium in the diet to no more than 2 g or 2000 mg daily. Limiting the amount of sodium is often used to help lower blood pressure. It is important if you have heart, liver, or kidney problems. Many foods contain sodium for flavor and sometimes as a preservative. When the amount of sodium in a diet needs to be low, it is important to know what to look for when choosing foods and drinks. The following includes some information and guidelines to help make it easier for you to adapt to a low sodium diet. QUICK TIPS  Do not add salt to food.  Avoid convenience items and fast food.  Choose unsalted snack foods.  Buy lower sodium products, often labeled as "lower sodium" or "no salt added."  Check food labels to learn how much sodium is in 1 serving.  When eating at a restaurant, ask that your food be prepared with less salt or none, if possible. READING FOOD LABELS FOR SODIUM INFORMATION The nutrition facts label is a good place to find how much sodium is in foods. Look for products with no more than 500 to 600 mg of sodium per meal and no more than 150 mg per serving. Remember that 2 g = 2000 mg. The food label may also list foods as:  Sodium-free: Less than 5 mg in a serving.  Very low sodium: 35 mg or less in a serving.  Low-sodium: 140 mg or less in a serving.  Light in sodium: 50% less sodium in a serving. For example, if a food that usually has 300 mg of sodium is changed to become light in sodium, it will have 150 mg of sodium.  Reduced sodium: 25% less sodium in a serving. For example, if a food that usually has 400 mg of sodium is changed to reduced sodium, it will have 300 mg of sodium. CHOOSING FOODS Grains  Avoid: Salted crackers and snack items. Some cereals, including instant hot cereals. Bread stuffing and biscuit mixes. Seasoned rice or pasta mixes.  Choose: Unsalted snack items. Low-sodium cereals, oats,  puffed wheat and rice, shredded wheat. English muffins and bread. Pasta. Meats  Avoid: Salted, canned, smoked, spiced, pickled meats, including fish and poultry. Bacon, ham, sausage, cold cuts, hot dogs, anchovies.  Choose: Low-sodium canned tuna and salmon. Fresh or frozen meat, poultry, and fish. Dairy  Avoid: Processed cheese and spreads. Cottage cheese. Buttermilk and condensed milk. Regular cheese.  Choose: Milk. Low-sodium cottage cheese. Yogurt. Sour cream. Low-sodium cheese. Fruits and Vegetables  Avoid: Regular canned vegetables. Regular canned tomato sauce and paste. Frozen vegetables in sauces. Olives. Pickles. Relishes. Sauerkraut.  Choose: Low-sodium canned vegetables. Low-sodium tomato sauce and paste. Frozen or fresh vegetables. Fresh and frozen fruit. Condiments  Avoid: Canned and packaged gravies. Worcestershire sauce. Tartar sauce. Barbecue sauce. Soy sauce. Steak sauce. Ketchup. Onion, garlic, and table salt. Meat flavorings and tenderizers.  Choose: Fresh and dried herbs and spices. Low-sodium varieties of mustard and ketchup. Lemon juice. Tabasco sauce. Horseradish. SAMPLE 2 GRAM SODIUM MEAL PLAN Breakfast / Sodium (mg)  1 cup low-fat milk / 143 mg  2 slices whole-wheat toast / 270 mg  1 tbs heart-healthy margarine / 153 mg  1 hard-boiled egg / 139 mg  1 small orange / 0 mg Lunch / Sodium (mg)  1 cup raw carrots / 76 mg   cup hummus / 298 mg  1 cup low-fat milk /   143 mg   cup red grapes / 2 mg  1 whole-wheat pita bread / 356 mg Dinner / Sodium (mg)  1 cup whole-wheat pasta / 2 mg  1 cup low-sodium tomato sauce / 73 mg  3 oz lean ground beef / 57 mg  1 small side salad (1 cup raw spinach leaves,  cup cucumber,  cup yellow bell pepper) with 1 tsp olive oil and 1 tsp red wine vinegar / 25 mg Snack / Sodium (mg)  1 container low-fat vanilla yogurt / 107 mg  3 graham cracker squares / 127 mg Nutrient Analysis  Calories: 2033  Protein:  77 g  Carbohydrate: 282 g  Fat: 72 g  Sodium: 1971 mg Document Released: 10/06/2005 Document Revised: 12/29/2011 Document Reviewed: 01/07/2010 [employer] Patient Information 2014 [employer].  

## 2013-11-16 NOTE — Progress Notes (Signed)
MRN: [id number] Name: Crystal Whitaker  Sex: female Age: 48 y.o. DOB: 10/28/1965  Allergies: Review of patient's allergies indicates no known allergies.  Chief Complaint  Patient presents with  . Headache    HPI: Patient is 48 y.o. female who has history of hypertension comes today reported to have on and off left-sided headache, she has tried Tylenol she described the headache as pulsating, denies any photophobia or phonophobia but she has family history of migraine headaches, denies any numbness weakness or any sinus trouble. Patient denies any fever chills chest pain or shortness of breath. Today her blood pressure is elevated, noted on the last visit it was also elevated.  Past Medical History  Diagnosis Date  . Sinusitis   . Hypertension   . Back pain     Past Surgical History  Procedure Laterality Date  . Cesarean section        Medication List       This list is accurate as of: 11/16/13 11:27 AM.  Always use your most recent med list.               butalbital-acetaminophen-caffeine 50-325-40 MG per tablet  Commonly known as:  ESGIC  Take 1 tablet by mouth every 6 (six) hours as needed for headache.     lisinopril-hydrochlorothiazide 20-12.5 MG per tablet  Commonly known as:  ZESTORETIC  Take 1 tablet by mouth daily.     traMADol-acetaminophen 37.5-325 MG per tablet  Commonly known as:  ULTRACET  Take 1 tablet by mouth every 6 (six) hours as needed for pain.        Meds ordered this encounter  Medications  . lisinopril-hydrochlorothiazide (ZESTORETIC) 20-12.5 MG per tablet    Sig: Take 1 tablet by mouth daily.    Dispense:  90 tablet    Refill:  3  . butalbital-acetaminophen-caffeine (ESGIC) 50-325-40 MG per tablet    Sig: Take 1 tablet by mouth every 6 (six) hours as needed for headache.    Dispense:  30 tablet    Refill:  0     There is no immunization history on file for this patient.  History reviewed. No pertinent family history.  History   Substance Use Topics  . Smoking status: Never Smoker   . Smokeless tobacco: Not on file  . Alcohol Use: No    Review of Systems  As noted in HPI  Filed Vitals:   11/16/13 1028  BP: 164/102  Pulse: 60  Temp: 97.2 F (36.2 C)  Resp: 16    Physical Exam  Physical Exam  Constitutional: No distress.  HENT:  No sinus tenderness   Eyes: EOM are normal. Pupils are equal, round, and reactive to light.  Cardiovascular: Normal rate and regular rhythm.   Pulmonary/Chest: Breath sounds normal. No respiratory distress. She has no wheezes. She has no rales.  Musculoskeletal: She exhibits no edema.  Neurological: She has normal reflexes. No cranial nerve deficit. Coordination normal.    CBC    Component Value Date/Time   WBC 3.8* 04/09/2013 1148   RBC 4.69 04/09/2013 1148   HGB 12.7 04/09/2013 1148   HCT 38.1 04/09/2013 1148   PLT 261 04/09/2013 1148   MCV 81.2 04/09/2013 1148   LYMPHSABS 0.9 04/09/2013 1148   MONOABS 0.2 04/09/2013 1148   EOSABS 0.0 04/09/2013 1148   BASOSABS 0.0 04/09/2013 1148    CMP     Component Value Date/Time   NA 137 04/09/2013 1148   K 3.0* 04/09/2013 1148  CL 96 04/09/2013 1148   CO2 28 04/09/2013 1148   GLUCOSE 132* 04/09/2013 1148   BUN 12 04/09/2013 1148   CREATININE 0.59 04/09/2013 1148   CALCIUM 9.8 04/09/2013 1148   PROT 8.1 04/09/2013 1148   ALBUMIN 4.4 04/09/2013 1148   AST 16 04/09/2013 1148   ALT 12 04/09/2013 1148   ALKPHOS 56 04/09/2013 1148   BILITOT 0.4 04/09/2013 1148   GFRNONAA >90 04/09/2013 1148   GFRAA >90 04/09/2013 1148    No results found for this basename: chol,  tri,  ldl    No components found with this basename: hga1c    Lab Results  Component Value Date/Time   AST 16 04/09/2013 11:48 AM    Assessment and Plan  Essential hypertension, benign -uncontrolled her Plan:I will increase the dose of lisinopril-hydrochlorothiazide (ZESTORETIC) 20-12.5 MG per tablet daily also advised for low-salt diet  Headache(784.0) - Plan:may  be secondary to hypertension, we'll try  butalbital-acetaminophen-caffeine (ESGIC) 50-325-40 MG per tablet  Return in about 3 months (around 02/14/2014), or if symptoms worsen or fail to improve, for 2 weeks BP check .  Doris CheadleADVANI, Katalena Malveaux, MD

## 2013-11-18 ENCOUNTER — Ambulatory Visit: Payer: BC Managed Care – PPO | Admitting: Sports Medicine

## 2013-12-01 ENCOUNTER — Ambulatory Visit: Payer: BC Managed Care – PPO | Attending: Internal Medicine

## 2014-02-20 ENCOUNTER — Ambulatory Visit: Payer: BC Managed Care – PPO | Admitting: Internal Medicine

## 2014-02-22 ENCOUNTER — Encounter: Payer: Self-pay | Admitting: Internal Medicine

## 2014-02-22 ENCOUNTER — Ambulatory Visit: Payer: Self-pay | Attending: Internal Medicine

## 2014-02-22 ENCOUNTER — Ambulatory Visit (HOSPITAL_BASED_OUTPATIENT_CLINIC_OR_DEPARTMENT_OTHER): Payer: No Typology Code available for payment source | Admitting: Internal Medicine

## 2014-02-22 VITALS — BP 140/80 | HR 76 | Temp 98.2°F | Resp 16 | Wt 132.4 lb

## 2014-02-22 DIAGNOSIS — R51 Headache: Secondary | ICD-10-CM | POA: Insufficient documentation

## 2014-02-22 DIAGNOSIS — R519 Headache, unspecified: Secondary | ICD-10-CM | POA: Insufficient documentation

## 2014-02-22 DIAGNOSIS — I10 Essential (primary) hypertension: Secondary | ICD-10-CM

## 2014-02-22 DIAGNOSIS — Z1211 Encounter for screening for malignant neoplasm of colon: Secondary | ICD-10-CM

## 2014-02-22 DIAGNOSIS — Z79899 Other long term (current) drug therapy: Secondary | ICD-10-CM | POA: Insufficient documentation

## 2014-02-22 DIAGNOSIS — Z139 Encounter for screening, unspecified: Secondary | ICD-10-CM

## 2014-02-22 LAB — CBC WITH DIFFERENTIAL/PLATELET
BASOS ABS: 0 10*3/uL (ref 0.0–0.1)
BASOS PCT: 1 % (ref 0–1)
EOS ABS: 0.1 10*3/uL (ref 0.0–0.7)
EOS PCT: 3 % (ref 0–5)
HEMATOCRIT: 33.8 % — AB (ref 36.0–46.0)
Hemoglobin: 11.5 g/dL — ABNORMAL LOW (ref 12.0–15.0)
Lymphocytes Relative: 37 % (ref 12–46)
Lymphs Abs: 1.2 10*3/uL (ref 0.7–4.0)
MCH: 26.8 pg (ref 26.0–34.0)
MCHC: 34 g/dL (ref 30.0–36.0)
MCV: 78.8 fL (ref 78.0–100.0)
MONO ABS: 0.3 10*3/uL (ref 0.1–1.0)
Monocytes Relative: 9 % (ref 3–12)
Neutro Abs: 1.6 10*3/uL — ABNORMAL LOW (ref 1.7–7.7)
Neutrophils Relative %: 50 % (ref 43–77)
Platelets: 279 10*3/uL (ref 150–400)
RBC: 4.29 MIL/uL (ref 3.87–5.11)
RDW: 14.7 % (ref 11.5–15.5)
WBC: 3.2 10*3/uL — ABNORMAL LOW (ref 4.0–10.5)

## 2014-02-22 LAB — TSH: TSH: 0.79 u[IU]/mL (ref 0.350–4.500)

## 2014-02-22 MED ORDER — LISINOPRIL-HYDROCHLOROTHIAZIDE 20-12.5 MG PO TABS: 1.0000 | ORAL_TABLET | Freq: Every day | ORAL | Status: DC

## 2014-02-22 MED ORDER — BUTALBITAL-APAP-CAFFEINE 50-325-40 MG PO TABS: 1.0000 | ORAL_TABLET | Freq: Four times a day (QID) | ORAL | Status: DC | PRN

## 2014-02-22 NOTE — Progress Notes (Signed)
Patient here for follow up on her HTN and headaches Needs medications refilled as well

## 2014-02-22 NOTE — Progress Notes (Signed)
MRN: [id number] Name: Crystal Whitaker  Sex: female Age: 48 y.o. DOB: 05/22/1966  Allergies: Review of patient's allergies indicates no known allergies.  Chief Complaint  Patient presents with  . Follow-up    HTN    HPI: Patient is 48 y.o. female who has history of hypertension comes today for followup, today her blood pressure is borderline elevated denies any chest pain or shortness of breath, she has history of of headaches takes Fioricet when necessary for pain, she denies any acute symptoms has been compliant with taking her medications.  Past Medical History  Diagnosis Date  . Sinusitis   . Hypertension   . Back pain     Past Surgical History  Procedure Laterality Date  . Cesarean section        Medication List       This list is accurate as of: 02/22/14 10:17 AM.  Always use your most recent med list.               butalbital-acetaminophen-caffeine 50-325-40 MG per tablet  Commonly known as:  ESGIC  Take 1 tablet by mouth every 6 (six) hours as needed for headache.     lisinopril-hydrochlorothiazide 20-12.5 MG per tablet  Commonly known as:  ZESTORETIC  Take 1 tablet by mouth daily.     traMADol-acetaminophen 37.5-325 MG per tablet  Commonly known as:  ULTRACET  Take 1 tablet by mouth every 6 (six) hours as needed for pain.        Meds ordered this encounter  Medications  . butalbital-acetaminophen-caffeine (ESGIC) 50-325-40 MG per tablet    Sig: Take 1 tablet by mouth every 6 (six) hours as needed for headache.    Dispense:  30 tablet    Refill:  0  . lisinopril-hydrochlorothiazide (ZESTORETIC) 20-12.5 MG per tablet    Sig: Take 1 tablet by mouth daily.    Dispense:  90 tablet    Refill:  3     There is no immunization history on file for this patient.  History reviewed. No pertinent family history.  History  Substance Use Topics  . Smoking status: Never Smoker   . Smokeless tobacco: Not on file  . Alcohol Use: No    Review of  Systems  Respiratory: Negative for cough, chest tightness and wheezing.   Cardiovascular: Negative for chest pain and leg swelling.  Neurological: Negative for weakness and numbness.     As noted in HPI  Filed Vitals:   02/22/14 1010  BP: 140/80  Pulse:   Temp:   Resp:     Physical Exam  Physical Exam  Constitutional: No distress.  Eyes: EOM are normal. Pupils are equal, round, and reactive to light.  Cardiovascular: Normal rate and regular rhythm.   Pulmonary/Chest: Breath sounds normal. No respiratory distress. She has no wheezes. She has no rales.  Musculoskeletal: She exhibits no edema.    CBC    Component Value Date/Time   WBC 3.8* 04/09/2013 1148   RBC 4.69 04/09/2013 1148   HGB 12.7 04/09/2013 1148   HCT 38.1 04/09/2013 1148   PLT 261 04/09/2013 1148   MCV 81.2 04/09/2013 1148   LYMPHSABS 0.9 04/09/2013 1148   MONOABS 0.2 04/09/2013 1148   EOSABS 0.0 04/09/2013 1148   BASOSABS 0.0 04/09/2013 1148    CMP     Component Value Date/Time   NA 137 04/09/2013 1148   K 3.0* 04/09/2013 1148   CL 96 04/09/2013 1148   CO2 28 04/09/2013 1148  GLUCOSE 132* 04/09/2013 1148   BUN 12 04/09/2013 1148   CREATININE 0.59 04/09/2013 1148   CALCIUM 9.8 04/09/2013 1148   PROT 8.1 04/09/2013 1148   ALBUMIN 4.4 04/09/2013 1148   AST 16 04/09/2013 1148   ALT 12 04/09/2013 1148   ALKPHOS 56 04/09/2013 1148   BILITOT 0.4 04/09/2013 1148   GFRNONAA >90 04/09/2013 1148   GFRAA >90 04/09/2013 1148    No results found for this basename: chol, tri, ldl    No components found with this basename: hga1c    Lab Results  Component Value Date/Time   AST 16 04/09/2013 11:48 AM    Assessment and Plan  Essential hypertension, benign - Plan: Borderline elevated, continue with her lisinopril-hydrochlorothiazide (ZESTORETIC) 20-12.5 MG per tablet, also advise patient for DASH diet.  Headache(784.0) - Plan: butalbital-acetaminophen-caffeine (ESGIC) 50-325-40 MG per tablet when necessary for  pain  Screening - Plan: MM DIGITAL SCREENING BILATERAL, ordered baseline blood work  CBC with Differential, TSH, Vit D  25 hydroxy (rtn osteoporosis monitoring), COMPLETE METABOLIC PANEL WITH GFR, Lipid panel  Special screening for malignant neoplasms, colon - Plan: Ambulatory referral to Gastroenterology   Health Maintenance -Colonoscopy: referred to GI   -Mammogram: ordered   Return in about 4 months (around 06/25/2014).  Doris Cheadleeepak Haleigh Desmith, MD

## 2014-02-22 NOTE — Patient Instructions (Signed)
DASH Diet  The DASH diet stands for "Dietary Approaches to Stop Hypertension." It is a healthy eating plan that has been shown to reduce high blood pressure (hypertension) in as little as 14 days, while also possibly providing other significant health benefits. These other health benefits include reducing the risk of breast cancer after menopause and reducing the risk of type 2 diabetes, heart disease, colon cancer, and stroke. Health benefits also include weight loss and slowing kidney failure in patients with chronic kidney disease.   DIET GUIDELINES  · Limit salt (sodium). Your diet should contain less than 1500 mg of sodium daily.  · Limit refined or processed carbohydrates. Your diet should include mostly whole grains. Desserts and added sugars should be used sparingly.  · Include small amounts of heart-healthy fats. These types of fats include nuts, oils, and tub margarine. Limit saturated and trans fats. These fats have been shown to be harmful in the body.  CHOOSING FOODS   The following food groups are based on a 2000 calorie diet. See your Registered Dietitian for individual calorie needs.  Grains and Grain Products (6 to 8 servings daily)  · Eat More Often: Whole-wheat bread, brown rice, whole-grain or wheat pasta, quinoa, popcorn without added fat or salt (air popped).  · Eat Less Often: White bread, white pasta, white rice, cornbread.  Vegetables (4 to 5 servings daily)  · Eat More Often: Fresh, frozen, and canned vegetables. Vegetables may be raw, steamed, roasted, or grilled with a minimal amount of fat.  · Eat Less Often/Avoid: Creamed or fried vegetables. Vegetables in a cheese sauce.  Fruit (4 to 5 servings daily)  · Eat More Often: All fresh, canned (in natural juice), or frozen fruits. Dried fruits without added sugar. One hundred percent fruit juice (½ cup [237 mL] daily).  · Eat Less Often: Dried fruits with added sugar. Canned fruit in light or heavy syrup.  Lean Meats, Fish, and Poultry (2  servings or less daily. One serving is 3 to 4 oz [85-114 g]).  · Eat More Often: Ninety percent or leaner ground beef, tenderloin, sirloin. Round cuts of beef, chicken breast, turkey breast. All fish. Grill, bake, or broil your meat. Nothing should be fried.  · Eat Less Often/Avoid: Fatty cuts of meat, turkey, or chicken leg, thigh, or wing. Fried cuts of meat or fish.  Dairy (2 to 3 servings)  · Eat More Often: Low-fat or fat-free milk, low-fat plain or light yogurt, reduced-fat or part-skim cheese.  · Eat Less Often/Avoid: Milk (whole, 2%). Whole milk yogurt. Full-fat cheeses.  Nuts, Seeds, and Legumes (4 to 5 servings per week)  · Eat More Often: All without added salt.  · Eat Less Often/Avoid: Salted nuts and seeds, canned beans with added salt.  Fats and Sweets (limited)  · Eat More Often: Vegetable oils, tub margarines without trans fats, sugar-free gelatin. Mayonnaise and salad dressings.  · Eat Less Often/Avoid: Coconut oils, palm oils, butter, stick margarine, cream, half and half, cookies, candy, pie.  FOR MORE INFORMATION  The Dash Diet Eating Plan: www.dashdiet.org  Document Released: 09/25/2011 Document Revised: 12/29/2011 Document Reviewed: 09/25/2011  Exit[employer]® Patient Information ©2014 [employer].

## 2014-02-23 ENCOUNTER — Telehealth: Payer: Self-pay

## 2014-02-23 LAB — VITAMIN D 25 HYDROXY (VIT D DEFICIENCY, FRACTURES): Vit D, 25-Hydroxy: 34 ng/mL (ref 30–89)

## 2014-02-23 NOTE — Telephone Encounter (Signed)
Message copied by Lestine MountJUAREZ, Marvelous Woolford L on Thu Feb 23, 2014 11:03 AM ------      Message from: Doris CheadleADVANI, DEEPAK      Created: Thu Feb 23, 2014 10:47 AM       Blood work reviewed noticed impaired fasting glucose and high cholesterol, call and advise patient for low carbohydrate low-fat diet.      Noticed borderline anemia, patient has already been referred to GI for screening colonoscopy. ------

## 2014-02-23 NOTE — Telephone Encounter (Signed)
Patient not available Left message on voice mail to return our call 

## 2014-02-24 ENCOUNTER — Telehealth: Payer: Self-pay

## 2014-02-24 LAB — COMPLETE METABOLIC PANEL WITH GFR
ALT: 12 U/L (ref 0–35)
AST: 13 U/L (ref 0–37)
Albumin: 4.4 g/dL (ref 3.5–5.2)
Alkaline Phosphatase: 58 U/L (ref 39–117)
BILIRUBIN TOTAL: 0.3 mg/dL (ref 0.2–1.2)
BUN: 12 mg/dL (ref 6–23)
CO2: 25 meq/L (ref 19–32)
CREATININE: 0.55 mg/dL (ref 0.50–1.10)
Calcium: 10.3 mg/dL (ref 8.4–10.5)
Chloride: 101 mEq/L (ref 96–112)
GLUCOSE: 119 mg/dL — AB (ref 70–99)
Potassium: 3.5 mEq/L (ref 3.5–5.3)
Sodium: 139 mEq/L (ref 135–145)
TOTAL PROTEIN: 7.1 g/dL (ref 6.0–8.3)

## 2014-02-24 LAB — LIPID PANEL
Cholesterol: 230 mg/dL — ABNORMAL HIGH (ref 0–200)
HDL: 47 mg/dL (ref >39)
LDL Cholesterol: 126 mg/dL — ABNORMAL HIGH (ref 0–99)
TRIGLYCERIDES: 287 mg/dL — AB (ref <150)
Total CHOL/HDL Ratio: 4.9 Ratio
VLDL: 57 mg/dL — ABNORMAL HIGH (ref 0–40)
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
## 2014-02-28 ENCOUNTER — Emergency Department (HOSPITAL_COMMUNITY): Payer: No Typology Code available for payment source

## 2014-02-28 ENCOUNTER — Emergency Department (HOSPITAL_COMMUNITY)
Admission: EM | Admit: 2014-02-28 | Discharge: 2014-02-28 | Disposition: A | Discharge status: Home / Self Care | Payer: No Typology Code available for payment source | Attending: Emergency Medicine | Admitting: Emergency Medicine

## 2014-02-28 ENCOUNTER — Encounter (HOSPITAL_COMMUNITY): Payer: Self-pay | Admitting: Emergency Medicine

## 2014-02-28 DIAGNOSIS — Z79899 Other long term (current) drug therapy: Secondary | ICD-10-CM | POA: Insufficient documentation

## 2014-02-28 DIAGNOSIS — B349 Viral infection, unspecified: Secondary | ICD-10-CM

## 2014-02-28 DIAGNOSIS — B9789 Other viral agents as the cause of diseases classified elsewhere: Secondary | ICD-10-CM | POA: Insufficient documentation

## 2014-02-28 DIAGNOSIS — R000 Tachycardia, unspecified: Secondary | ICD-10-CM | POA: Insufficient documentation

## 2014-02-28 DIAGNOSIS — I10 Essential (primary) hypertension: Secondary | ICD-10-CM | POA: Insufficient documentation

## 2014-02-28 DIAGNOSIS — R519 Headache, unspecified: Secondary | ICD-10-CM

## 2014-02-28 DIAGNOSIS — R51 Headache: Secondary | ICD-10-CM

## 2014-02-28 LAB — URINALYSIS, ROUTINE W REFLEX MICROSCOPIC
Bilirubin Urine: NEGATIVE
Glucose, UA: NEGATIVE mg/dL
HGB URINE DIPSTICK: NEGATIVE
Ketones, ur: NEGATIVE mg/dL
Leukocytes, UA: NEGATIVE
Nitrite: NEGATIVE
PH: 7 (ref 5.0–8.0)
Protein, ur: NEGATIVE mg/dL
Specific Gravity, Urine: 1.011 (ref 1.005–1.030)
UROBILINOGEN UA: 0.2 mg/dL (ref 0.0–1.0)

## 2014-02-28 LAB — CSF CELL COUNT WITH DIFFERENTIAL
RBC COUNT CSF: 2 /mm3 — AB
RBC Count, CSF: 0 /mm3
Tube #: 1
WBC CSF: 3 /mm3 (ref 0–5)
WBC, CSF: 1 /mm3 (ref 0–5)

## 2014-02-28 LAB — COMPREHENSIVE METABOLIC PANEL
ALT: 21 U/L (ref 0–35)
AST: 33 U/L (ref 0–37)
Albumin: 3.8 g/dL (ref 3.5–5.2)
Alkaline Phosphatase: 56 U/L (ref 39–117)
BUN: 14 mg/dL (ref 6–23)
CALCIUM: 10.2 mg/dL (ref 8.4–10.5)
CO2: 25 meq/L (ref 19–32)
CREATININE: 0.79 mg/dL (ref 0.50–1.10)
Chloride: 96 mEq/L (ref 96–112)
GLUCOSE: 143 mg/dL — AB (ref 70–99)
Potassium: 3.6 mEq/L — ABNORMAL LOW (ref 3.7–5.3)
Sodium: 137 mEq/L (ref 137–147)
Total Bilirubin: 0.5 mg/dL (ref 0.3–1.2)
Total Protein: 7.4 g/dL (ref 6.0–8.3)

## 2014-02-28 LAB — CBC WITH DIFFERENTIAL/PLATELET
BASOS ABS: 0 10*3/uL (ref 0.0–0.1)
Basophils Relative: 0 % (ref 0–1)
EOS PCT: 0 % (ref 0–5)
Eosinophils Absolute: 0 10*3/uL (ref 0.0–0.7)
HEMATOCRIT: 35.2 % — AB (ref 36.0–46.0)
Hemoglobin: 11.6 g/dL — ABNORMAL LOW (ref 12.0–15.0)
LYMPHS PCT: 6 % — AB (ref 12–46)
Lymphs Abs: 0.8 10*3/uL (ref 0.7–4.0)
MCH: 27 pg (ref 26.0–34.0)
MCHC: 33 g/dL (ref 30.0–36.0)
MCV: 82.1 fL (ref 78.0–100.0)
MONO ABS: 0.8 10*3/uL (ref 0.1–1.0)
Monocytes Relative: 5 % (ref 3–12)
Neutro Abs: 12.7 10*3/uL — ABNORMAL HIGH (ref 1.7–7.7)
Neutrophils Relative %: 89 % — ABNORMAL HIGH (ref 43–77)
Platelets: 247 10*3/uL (ref 150–400)
RBC: 4.29 MIL/uL (ref 3.87–5.11)
RDW: 14.1 % (ref 11.5–15.5)
WBC: 14.4 10*3/uL — AB (ref 4.0–10.5)

## 2014-02-28 LAB — GLUCOSE, CSF: Glucose, CSF: 90 mg/dL — ABNORMAL HIGH (ref 43–76)

## 2014-02-28 LAB — GRAM STAIN: SPECIAL REQUESTS: NORMAL

## 2014-02-28 LAB — I-STAT CG4 LACTIC ACID, ED
LACTIC ACID, VENOUS: 2.59 mmol/L — AB (ref 0.5–2.2)
Lactic Acid, Venous: 0.83 mmol/L (ref 0.5–2.2)

## 2014-02-28 LAB — PROTEIN, CSF: Total  Protein, CSF: 18 mg/dL (ref 15–45)

## 2014-02-28 MED ORDER — SODIUM CHLORIDE 0.9 % IV BOLUS (SEPSIS)
1000.0000 mL | Freq: Once | INTRAVENOUS | Status: AC
  Administered 2014-02-28: 1000 mL via INTRAVENOUS

## 2014-02-28 MED ORDER — ACETAMINOPHEN 500 MG PO TABS
1000.0000 mg | ORAL_TABLET | Freq: Once | ORAL | Status: AC
  Administered 2014-02-28: 1000 mg via ORAL
  Filled 2014-02-28: qty 2

## 2014-02-28 MED ORDER — DIPHENHYDRAMINE HCL 50 MG/ML IJ SOLN
25.0000 mg | Freq: Once | INTRAMUSCULAR | Status: AC
  Administered 2014-02-28: 25 mg via INTRAVENOUS
  Filled 2014-02-28: qty 1

## 2014-02-28 MED ORDER — METOCLOPRAMIDE HCL 10 MG PO TABS: 10.0000 mg | ORAL_TABLET | Freq: Four times a day (QID) | ORAL | Status: DC | PRN

## 2014-02-28 MED ORDER — METOCLOPRAMIDE HCL 5 MG/ML IJ SOLN
10.0000 mg | Freq: Once | INTRAMUSCULAR | Status: AC
  Administered 2014-02-28: 10 mg via INTRAVENOUS
  Filled 2014-02-28: qty 2

## 2014-02-28 NOTE — Progress Notes (Signed)
[facility] did not get to see patient but will be sending information about [facility], using address provided, to help patient establish primary care.

## 2014-02-28 NOTE — Discharge Instructions (Signed)
Take motrin, tylenol for pain, fever.   Take reglan for headache.   Follow up with your doctor.   Return to ER if you have severe pain, fever, headache.

## 2014-02-28 NOTE — ED Notes (Signed)
Pt c/o fever and headache x 1 day. States headache is in front. Pt c/o pain in both hips and knees and n/v. Vomited twice last night.

## 2014-02-28 NOTE — ED Provider Notes (Signed)
CSN: [id number]     Arrival date & time 02/28/14  0755 History   First MD Initiated Contact with Patient 02/28/14 0820     Chief Complaint  Patient presents with  . Fever  . Headache  . Nausea  . Vomiting     (Consider location/radiation/quality/duration/timing/severity/associated sxs/prior Treatment) The history is provided by the patient.  Crystal Whitaker is a 48 y.o. female hx of sinusitis, HTN here with headache, fever for a week. Fever since yesterday as well as diffuse headaches. Neck pain but denies any neck stiffness. Denies any rash but she does have diffuse myalgias. Vomited twice last diaper was able to keep food down this morning. Denies urinary symptoms. Had some non productive cough.    Past Medical History  Diagnosis Date  . Sinusitis   . Hypertension   . Back pain    Past Surgical History  Procedure Laterality Date  . Cesarean section     No family history on file. History  Substance Use Topics  . Smoking status: Never Smoker   . Smokeless tobacco: Not on file  . Alcohol Use: No   OB History   Grav Para Term Preterm Abortions TAB SAB Ect Mult Living                 Review of Systems  Constitutional: Positive for fever.  Neurological: Positive for headaches.  All other systems reviewed and are negative.     Allergies  Review of patient's allergies indicates no known allergies.  Home Medications   Prior to Admission medications   Medication Sig Start Date End Date Taking? Authorizing Provider  acetaminophen (TYLENOL) 500 MG tablet Take 500 mg by mouth every 6 (six) hours as needed for headache.   Yes Historical Provider, MD  butalbital-acetaminophen-caffeine (ESGIC) 50-325-40 MG per tablet Take 1 tablet by mouth every 6 (six) hours as needed for headache. 02/22/14  Yes Doris Cheadle, MD  lisinopril-hydrochlorothiazide (ZESTORETIC) 20-12.5 MG per tablet Take 1 tablet by mouth daily. 02/22/14  Yes Deepak Advani, MD   BP 131/66  Pulse 90  Temp(Src)  99.1 F (37.3 C) (Oral)  Resp 19  SpO2 98%  LMP 06/04/2011 Physical Exam  Nursing note and vitals reviewed. Constitutional: She is oriented to person, place, and time. She appears well-nourished.  HENT:  Head: Normocephalic and atraumatic.  Mouth/Throat: Oropharynx is clear and moist.  Eyes: Conjunctivae and EOM are normal. Pupils are equal, round, and reactive to light.  Neck:  No obvious meningeal signs. + cervical LAD bilaterally.   Cardiovascular: Regular rhythm and normal heart sounds.   Slightly tachy   Pulmonary/Chest: Effort normal.  ? Crackles L base   Abdominal: Soft. Bowel sounds are normal. She exhibits no distension. There is no tenderness. There is no rebound.  Musculoskeletal: Normal range of motion. She exhibits no edema.  Neurological: She is alert and oriented to person, place, and time.  Skin: Skin is warm and dry.  Psychiatric: She has a normal mood and affect. Her behavior is normal. Judgment and thought content normal.    ED Course  LUMBAR PUNCTURE Date/Time: 02/28/2014 1:39 PM Performed by: Richardean Canal Authorized by: Richardean Canal Consent: Verbal consent obtained. written consent obtained. Risks and benefits: risks, benefits and alternatives were discussed Consent given by: patient Patient understanding: patient states understanding of the procedure being performed Patient consent: the patient's understanding of the procedure matches consent given Procedure consent: procedure consent matches procedure scheduled Relevant documents: relevant documents present and verified  Test results: test results available and properly labeled Patient identity confirmed: verbally with patient and arm band Indications: evaluation for infection Local anesthetic: lidocaine 2% without epinephrine Anesthetic total: 10 ml Patient sedated: no Preparation: Patient was prepped and draped in the usual sterile fashion. Lumbar space: L3-L4 interspace Patient's position:  sitting Needle gauge: 22 Needle type: diamond point Needle length: 3.5 in Number of attempts: 1 Fluid appearance: clear Tubes of fluid: 4 Total volume: 6 ml Post-procedure: site cleaned and pressure dressing applied Patient tolerance: Patient tolerated the procedure well with no immediate complications.   (including critical care time)   Labs Review Labs Reviewed  CBC WITH DIFFERENTIAL - Abnormal; Notable for the following:    WBC 14.4 (*)    Hemoglobin 11.6 (*)    HCT 35.2 (*)    Neutrophils Relative % 89 (*)    Neutro Abs 12.7 (*)    Lymphocytes Relative 6 (*)    All other components within normal limits  COMPREHENSIVE METABOLIC PANEL - Abnormal; Notable for the following:    Potassium 3.6 (*)    Glucose, Bld 143 (*)    All other components within normal limits  CSF CELL COUNT WITH DIFFERENTIAL - Abnormal; Notable for the following:    RBC Count, CSF 2 (*)    All other components within normal limits  GLUCOSE, CSF - Abnormal; Notable for the following:    Glucose, CSF 90 (*)    All other components within normal limits  I-STAT CG4 LACTIC ACID, ED - Abnormal; Notable for the following:    Lactic Acid, Venous 2.59 (*)    All other components within normal limits  GRAM STAIN  URINE CULTURE  CSF CULTURE  URINALYSIS, ROUTINE W REFLEX MICROSCOPIC  CSF CELL COUNT WITH DIFFERENTIAL  PROTEIN, CSF  I-STAT CG4 LACTIC ACID, ED    Imaging Review Dg Chest 2 View  02/28/2014   CLINICAL DATA:  Fever and headache and nausea for 2 days  EXAM: CHEST  2 VIEW  COMPARISON:  DG CHEST 2 VIEW dated 12/31/2010  FINDINGS: The lungs are adequately inflated. There is no focal infiltrate. The cardiopericardial silhouette is normal in size. The pulmonary vascularity is not engorged. The mediastinum is normal in width. The trachea is midline. There is no pleural effusion. The observed portions of the bony thorax appear normal. The gas pattern in the visualized portions of the upper abdomen appears  normal.  IMPRESSION: There is no evidence of pneumonia nor other active cardiopulmonary disease.   Electronically Signed   By: David  SwazilandJordan   On: 02/28/2014 08:41   Ct Head Wo Contrast  02/28/2014   CLINICAL DATA:  Headache with fever and nausea  EXAM: CT HEAD WITHOUT CONTRAST  TECHNIQUE: Contiguous axial images were obtained from the base of the skull through the vertex without intravenous contrast.  COMPARISON:  None.  FINDINGS: The ventricles are normal in size and configuration. There is no mass, hemorrhage, extra-axial fluid collection, or midline shift. Gray-white compartments appear normal. No evidence suggesting acute infarct.  Bony calvarium appears intact. The mastoid air cells are clear. There is debris in the left external auditory canal.  IMPRESSION: Suspect cerumen in the left external auditory canal. Study otherwise unremarkable. Note that there is no intracranial mass, hemorrhage, or evidence of acute infarct.   Electronically Signed   By: Bretta BangWilliam  Woodruff M.D.   On: 02/28/2014 09:14     EKG Interpretation None      MDM   Final diagnoses:  None   Crystal Whitaker is a 48 y.o. female here with fever, myalgias, headache. Consider viral syndrome vs meningitis. Will check labs, CT head, CXR, UA and reassess.   10:30 WBC 15. Lactate elevated. CXR, UA nl. Concerned for possible meningitis. Will do LP.   1:40 PM LP normal. Likely viral meningitis. Lactate nl after IVF. Likely viral meningitis vs viral syndrome.       Richardean Canalavid H Yao, MD 02/28/14 1341

## 2014-02-28 NOTE — ED Notes (Signed)
Pt A&O x4. No complaints at this time. AVS explained carefully. Advised pt to drink plenty of fluids. Ambulatory with steady gait. Family member at bedside. Both acknowledge AVS explanation.

## 2014-03-01 LAB — URINE CULTURE

## 2014-03-02 ENCOUNTER — Encounter (HOSPITAL_COMMUNITY): Payer: Self-pay | Admitting: Emergency Medicine

## 2014-03-02 ENCOUNTER — Emergency Department (HOSPITAL_COMMUNITY)
Admission: EM | Admit: 2014-03-02 | Discharge: 2014-03-02 | Disposition: A | Discharge status: Home / Self Care | Payer: No Typology Code available for payment source | Attending: Emergency Medicine | Admitting: Emergency Medicine

## 2014-03-02 DIAGNOSIS — Z8739 Personal history of other diseases of the musculoskeletal system and connective tissue: Secondary | ICD-10-CM | POA: Insufficient documentation

## 2014-03-02 DIAGNOSIS — J029 Acute pharyngitis, unspecified: Secondary | ICD-10-CM | POA: Insufficient documentation

## 2014-03-02 DIAGNOSIS — Z79899 Other long term (current) drug therapy: Secondary | ICD-10-CM | POA: Insufficient documentation

## 2014-03-02 DIAGNOSIS — I10 Essential (primary) hypertension: Secondary | ICD-10-CM | POA: Insufficient documentation

## 2014-03-02 DIAGNOSIS — H9209 Otalgia, unspecified ear: Secondary | ICD-10-CM | POA: Insufficient documentation

## 2014-03-02 MED ORDER — PENICILLIN G BENZATHINE 1200000 UNIT/2ML IM SUSP
1.2000 10*6.[IU] | Freq: Once | INTRAMUSCULAR | Status: AC
  Administered 2014-03-02: 1.2 10*6.[IU] via INTRAMUSCULAR
  Filled 2014-03-02: qty 2

## 2014-03-02 MED ORDER — PENICILLIN G BENZATHINE & PROC 1200000 UNIT/2ML IM SUSP
1.2000 10*6.[IU] | Freq: Once | INTRAMUSCULAR | Status: DC
  Filled 2014-03-02: qty 2

## 2014-03-02 MED ORDER — OXYCODONE-ACETAMINOPHEN 5-325 MG PO TABS
1.0000 | ORAL_TABLET | Freq: Once | ORAL | Status: AC
  Administered 2014-03-02: 1 via ORAL
  Filled 2014-03-02: qty 1

## 2014-03-02 MED ORDER — IBUPROFEN 200 MG PO TABS
400.0000 mg | ORAL_TABLET | Freq: Once | ORAL | Status: AC
  Administered 2014-03-02: 400 mg via ORAL
  Filled 2014-03-02: qty 2

## 2014-03-02 MED ORDER — LIDOCAINE VISCOUS 2 % MT SOLN
10.0000 mL | Freq: Once | OROMUCOSAL | Status: AC
  Administered 2014-03-02: 10 mL via OROMUCOSAL
  Filled 2014-03-02: qty 10

## 2014-03-02 MED ORDER — OXYCODONE-ACETAMINOPHEN 5-325 MG PO TABS: 1.0000 | ORAL_TABLET | ORAL | Status: DC | PRN

## 2014-03-02 NOTE — ED Notes (Addendum)
Pt c/o sore throat x 1 day.  Pain score 10/10.  [facility] and diagnosed w/ a virus and headache.

## 2014-03-02 NOTE — ED Notes (Signed)
Patient was educated not to drive, operate heavy machinery, or drink alcohol while taking narcotic medication.  

## 2014-03-02 NOTE — ED Provider Notes (Signed)
CSN: [id number]     Arrival date & time 03/02/14  0845 History   First MD Initiated Contact with Patient 03/02/14 [phone]     Chief Complaint  Patient presents with  . Sore Throat     (Consider location/radiation/quality/duration/timing/severity/associated sxs/prior Treatment) HPI  48 year old female with sore throat/neck pain. Symptom onset of couple days ago with fever, headache and neck pain. She was evaluated in emergency room including a lumbar puncture which is not consistent with meningitis. Symptoms have persisted and now is sore throat. Pain in both ears. Subjective fever. No chest pain, cough or shortness of breath. No sick contacts. Continue nausea. Has been taking Reglan with some mild relief.  Past Medical History  Diagnosis Date  . Sinusitis   . Hypertension   . Back pain    Past Surgical History  Procedure Laterality Date  . Cesarean section     History reviewed. No pertinent family history. History  Substance Use Topics  . Smoking status: Never Smoker   . Smokeless tobacco: Not on file  . Alcohol Use: No   OB History   Grav Para Term Preterm Abortions TAB SAB Ect Mult Living                 Review of Systems   All systems reviewed and negative, other than as noted in HPI.  Allergies  Review of patient's allergies indicates no known allergies.  Home Medications   Prior to Admission medications   Medication Sig Start Date End Date Taking? Authorizing Provider  acetaminophen (TYLENOL) 500 MG tablet Take 500 mg by mouth every 6 (six) hours as needed for headache.    Historical Provider, MD  butalbital-acetaminophen-caffeine (ESGIC) 50-325-40 MG per tablet Take 1 tablet by mouth every 6 (six) hours as needed for headache. 02/22/14   Doris Cheadleeepak Advani, MD  lisinopril-hydrochlorothiazide (ZESTORETIC) 20-12.5 MG per tablet Take 1 tablet by mouth daily. 02/22/14   Doris Cheadleeepak Advani, MD  metoCLOPramide (REGLAN) 10 MG tablet Take 1 tablet (10 mg total) by mouth every 6 (six)  hours as needed for nausea (nausea/headache). 02/28/14   Richardean Canalavid H Yao, MD  oxyCODONE-acetaminophen (PERCOCET/ROXICET) 5-325 MG per tablet Take 1 tablet by mouth every 4 (four) hours as needed for severe pain. 03/02/14   Raeford RazorStephen Braysen Cloward, MD   BP 143/90  Pulse 89  Temp(Src) 98 F (36.7 C) (Oral)  Resp 16  SpO2 100%  LMP 06/04/2011 Physical Exam  Nursing note and vitals reviewed. Constitutional: She appears well-developed and well-nourished. No distress.  Laying in bed. No acute distress.  HENT:  Head: Normocephalic and atraumatic.  Right Ear: External ear normal.  Left Ear: External ear normal.  Mouth/Throat: Oropharyngeal exudate present.  Neck is supple. Pharyngitis with some exudate on the left. Uvula midline. Handling secretions. Voice does not sound muffled. Tender cervical adenopathy. Submental tissues are soft. No stridor. Tympanic membranes clear bilaterally.  Eyes: Conjunctivae are normal. Right eye exhibits no discharge. Left eye exhibits no discharge.  Neck: Neck supple.  No nuchal rigidity  Cardiovascular: Normal rate, regular rhythm and normal heart sounds.  Exam reveals no gallop and no friction rub.   No murmur heard. Pulmonary/Chest: Effort normal and breath sounds normal. No respiratory distress.  Abdominal: Soft. She exhibits no distension. There is no tenderness.  Musculoskeletal: She exhibits no edema and no tenderness.  Neurological: She is alert.  Skin: Skin is warm and dry.  Psychiatric: She has a normal mood and affect. Her behavior is normal. Thought content normal.  ED Course  Procedures (including critical care time) Labs Review Labs Reviewed - No data to display  Imaging Review No results found.   EKG Interpretation None      MDM   Final diagnoses:  Pharyngitis    48 year old female with exudative pharyngitis. Adenopathy. Denies cough. If you include subjective fever, she has 4 out of 4 centor criteria. Empiric treatment. No meningeal signs.  Patient had a lumbar puncture on her last evaluation a couple days ago which was not consistent with meningitis. She does not appear toxic. No evidence of airway compromise. Continued symptomatic treatment.    Raeford RazorStephen Trenisha Lafavor, MD 03/02/14 [phone]

## 2014-03-02 NOTE — Discharge Instructions (Signed)
Pharyngitis °Pharyngitis is redness, pain, and swelling (inflammation) of your pharynx.  °CAUSES  °Pharyngitis is usually caused by infection. Most of the time, these infections are from viruses (viral) and are part of a cold. However, sometimes pharyngitis is caused by bacteria (bacterial). Pharyngitis can also be caused by allergies. Viral pharyngitis may be spread from person to person by coughing, sneezing, and personal items or utensils (cups, forks, spoons, toothbrushes). Bacterial pharyngitis may be spread from person to person by more intimate contact, such as kissing.  °SIGNS AND SYMPTOMS  °Symptoms of pharyngitis include:   °· Sore throat.   °· Tiredness (fatigue).   °· Low-grade fever.   °· Headache. °· Joint pain and muscle aches. °· Skin rashes. °· Swollen lymph nodes. °· Plaque-like film on throat or tonsils (often seen with bacterial pharyngitis). °DIAGNOSIS  °Your health care provider will ask you questions about your illness and your symptoms. Your medical history, along with a physical exam, is often all that is needed to diagnose pharyngitis. Sometimes, a rapid strep test is done. Other lab tests may also be done, depending on the suspected cause.  °TREATMENT  °Viral pharyngitis will usually get better in 3 4 days without the use of medicine. Bacterial pharyngitis is treated with medicines that kill germs (antibiotics).  °HOME CARE INSTRUCTIONS  °· Drink enough water and fluids to keep your urine clear or pale yellow.   °· Only take over-the-counter or prescription medicines as directed by your health care provider:   °· If you are prescribed antibiotics, make sure you finish them even if you start to feel better.   °· Do not take aspirin.   °· Get lots of rest.   °· Gargle with 8 oz of salt water (½ tsp of salt per 1 qt of water) as often as every 1 2 hours to soothe your throat.   °· Throat lozenges (if you are not at risk for choking) or sprays may be used to soothe your throat. °SEEK MEDICAL  CARE IF:  °· You have large, tender lumps in your neck. °· You have a rash. °· You cough up green, yellow-brown, or bloody spit. °SEEK IMMEDIATE MEDICAL CARE IF:  °· Your neck becomes stiff. °· You drool or are unable to swallow liquids. °· You vomit or are unable to keep medicines or liquids down. °· You have severe pain that does not go away with the use of recommended medicines. °· You have trouble breathing (not caused by a stuffy nose). °MAKE SURE YOU:  °· Understand these instructions. °· Will watch your condition. °· Will get help right away if you are not doing well or get worse. °Document Released: 10/06/2005 Document Revised: 07/27/2013 Document Reviewed: 06/13/2013 °ExitCare® Patient Information ©2014 [employer]. ° °

## 2014-03-03 LAB — CSF CULTURE W GRAM STAIN
Culture: NO GROWTH
Special Requests: NORMAL

## 2014-08-21 ENCOUNTER — Ambulatory Visit: Payer: No Typology Code available for payment source | Attending: Internal Medicine | Admitting: Internal Medicine

## 2014-08-21 VITALS — BP 146/76 | HR 67 | Temp 98.0°F

## 2014-08-21 DIAGNOSIS — G44201 Tension-type headache, unspecified, intractable: Secondary | ICD-10-CM

## 2014-08-21 DIAGNOSIS — R51 Headache: Secondary | ICD-10-CM | POA: Insufficient documentation

## 2014-08-21 NOTE — Progress Notes (Signed)
Patient presents with husband for headache and feeling warm since this AM Rates headache 9/10 at present Denies visual disturbances Reports having taken 1 tab acetaminophen 350 mg this AM with some relief States ran out of esgic 3 months ago and would like refill  States taking lisinopril/HCTZ 20/12.5 as directed Reports she takes in evening so has not had yet today  BP 146/76  left arm manually P 67 T 98.0 oral SPO2 99%  Patient made aware that PCP wanted to see her for f/u in September 2015 and advised to schedule appt. Esgic e-scribed to [facility] on [address]

## 2014-08-22 ENCOUNTER — Telehealth: Payer: Self-pay | Admitting: *Deleted

## 2014-08-22 MED ORDER — BUTALBITAL-APAP-CAFFEINE 50-325-40 MG PO TABS: 1.0000 | ORAL_TABLET | Freq: Four times a day (QID) | ORAL | Status: DC | PRN

## 2014-08-22 NOTE — Telephone Encounter (Signed)
Patient notified [facility] [phone] "Crystal Whitaker" ID [id number] that  esgic has been e-scribed to [facility] on [address] per PCP

## 2014-08-28 ENCOUNTER — Telehealth: Payer: Self-pay | Admitting: Internal Medicine

## 2014-08-28 NOTE — Telephone Encounter (Signed)
Pt. Called stating that her medication for headaches was not sent over to the pharmacy and she would like to see nurse. Please f/u with pt.

## 2014-09-01 ENCOUNTER — Other Ambulatory Visit: Payer: Self-pay | Admitting: Emergency Medicine

## 2014-09-01 DIAGNOSIS — G44201 Tension-type headache, unspecified, intractable: Secondary | ICD-10-CM

## 2014-09-01 MED ORDER — BUTALBITAL-APAP-CAFFEINE 50-325-40 MG PO TABS
1.0000 | ORAL_TABLET | Freq: Four times a day (QID) | ORAL | Status: DC | PRN
Start: 2014-09-01 — End: 2014-10-23

## 2014-09-01 NOTE — Telephone Encounter (Signed)
Medication changed to e-scribed to [facility] pharmacy

## 2014-09-11 ENCOUNTER — Ambulatory Visit: Payer: Self-pay

## 2014-09-27 ENCOUNTER — Ambulatory Visit: Payer: Self-pay

## 2014-10-04 ENCOUNTER — Ambulatory Visit: Payer: Self-pay | Attending: Internal Medicine

## 2014-10-18 ENCOUNTER — Ambulatory Visit: Payer: Self-pay | Admitting: Internal Medicine

## 2014-10-23 ENCOUNTER — Encounter: Payer: Self-pay | Admitting: Internal Medicine

## 2014-10-23 ENCOUNTER — Ambulatory Visit: Payer: Self-pay | Attending: Internal Medicine | Admitting: Internal Medicine

## 2014-10-23 ENCOUNTER — Ambulatory Visit: Payer: Self-pay

## 2014-10-23 ENCOUNTER — Ambulatory Visit (HOSPITAL_BASED_OUTPATIENT_CLINIC_OR_DEPARTMENT_OTHER): Payer: Self-pay

## 2014-10-23 VITALS — BP 150/80 | HR 71 | Temp 98.0°F | Resp 16 | Wt 131.0 lb

## 2014-10-23 DIAGNOSIS — R51 Headache: Secondary | ICD-10-CM | POA: Insufficient documentation

## 2014-10-23 DIAGNOSIS — Z139 Encounter for screening, unspecified: Secondary | ICD-10-CM

## 2014-10-23 DIAGNOSIS — Z23 Encounter for immunization: Secondary | ICD-10-CM

## 2014-10-23 DIAGNOSIS — Z79899 Other long term (current) drug therapy: Secondary | ICD-10-CM | POA: Insufficient documentation

## 2014-10-23 DIAGNOSIS — I10 Essential (primary) hypertension: Secondary | ICD-10-CM | POA: Insufficient documentation

## 2014-10-23 DIAGNOSIS — G44209 Tension-type headache, unspecified, not intractable: Secondary | ICD-10-CM

## 2014-10-23 LAB — COMPLETE METABOLIC PANEL WITH GFR
ALT: 13 U/L (ref 0–35)
AST: 13 U/L (ref 0–37)
Albumin: 4.2 g/dL (ref 3.5–5.2)
Alkaline Phosphatase: 58 U/L (ref 39–117)
BUN: 14 mg/dL (ref 6–23)
CALCIUM: 9.8 mg/dL (ref 8.4–10.5)
CHLORIDE: 99 meq/L (ref 96–112)
CO2: 31 mEq/L (ref 19–32)
CREATININE: 0.66 mg/dL (ref 0.50–1.10)
GFR, Est African American: >89 mL/min
GFR, Est Non African American: >89 mL/min
Glucose, Bld: 92 mg/dL (ref 70–99)
POTASSIUM: 4.3 meq/L (ref 3.5–5.3)
Sodium: 138 mEq/L (ref 135–145)
Total Bilirubin: 0.3 mg/dL (ref 0.2–1.2)
Total Protein: 7.1 g/dL (ref 6.0–8.3)

## 2014-10-23 MED ORDER — AMITRIPTYLINE HCL 25 MG PO TABS: 25.0000 mg | ORAL_TABLET | Freq: Every day | ORAL | Status: DC

## 2014-10-23 MED ORDER — BUTALBITAL-APAP-CAFFEINE 50-325-40 MG PO TABS: 1.0000 | ORAL_TABLET | Freq: Four times a day (QID) | ORAL | Status: DC | PRN

## 2014-10-23 NOTE — Progress Notes (Signed)
Patient complains of headache that started yesterday Flu vaccine given at this appointment

## 2014-10-23 NOTE — Patient Instructions (Signed)
DASH Eating Plan °DASH stands for "Dietary Approaches to Stop Hypertension." The DASH eating plan is a healthy eating plan that has been shown to reduce high blood pressure (hypertension). Additional health benefits may include reducing the risk of type 2 diabetes mellitus, heart disease, and stroke. The DASH eating plan may also help with weight loss. °WHAT DO I NEED TO KNOW ABOUT THE DASH EATING PLAN? °For the DASH eating plan, you will follow these general guidelines: °· Choose foods with a percent daily value for sodium of less than 5% (as listed on the food label). °· Use salt-free seasonings or herbs instead of table salt or sea salt. °· Check with your health care provider or pharmacist before using salt substitutes. °· Eat lower-sodium products, often labeled as "lower sodium" or "no salt added." °· Eat fresh foods. °· Eat more vegetables, fruits, and low-fat dairy products. °· Choose whole grains. Look for the word "whole" as the first word in the ingredient list. °· Choose fish and skinless chicken or turkey more often than red meat. Limit fish, poultry, and meat to 6 oz (170 g) each day. °· Limit sweets, desserts, sugars, and sugary drinks. °· Choose heart-healthy fats. °· Limit cheese to 1 oz (28 g) per day. °· Eat more home-cooked food and less restaurant, buffet, and fast food. °· Limit fried foods. °· Cook foods using methods other than frying. °· Limit canned vegetables. If you do use them, rinse them well to decrease the sodium. °· When eating at a restaurant, ask that your food be prepared with less salt, or no salt if possible. °WHAT FOODS CAN I EAT? °Seek help from a dietitian for individual calorie needs. °Grains °Whole grain or whole wheat bread. Brown rice. Whole grain or whole wheat pasta. Quinoa, bulgur, and whole grain cereals. Low-sodium cereals. Corn or whole wheat flour tortillas. Whole grain cornbread. Whole grain crackers. Low-sodium crackers. °Vegetables °Fresh or frozen vegetables  (raw, steamed, roasted, or grilled). Low-sodium or reduced-sodium tomato and vegetable juices. Low-sodium or reduced-sodium tomato sauce and paste. Low-sodium or reduced-sodium canned vegetables.  °Fruits °All fresh, canned (in natural juice), or frozen fruits. °Meat and Other Protein Products °Ground beef (85% or leaner), grass-fed beef, or beef trimmed of fat. Skinless chicken or turkey. Ground chicken or turkey. Pork trimmed of fat. All fish and seafood. Eggs. Dried beans, peas, or lentils. Unsalted nuts and seeds. Unsalted canned beans. °Dairy °Low-fat dairy products, such as skim or 1% milk, 2% or reduced-fat cheeses, low-fat ricotta or cottage cheese, or plain low-fat yogurt. Low-sodium or reduced-sodium cheeses. °Fats and Oils °Tub margarines without trans fats. Light or reduced-fat mayonnaise and salad dressings (reduced sodium). Avocado. Safflower, olive, or canola oils. Natural peanut or almond butter. °Other °Unsalted popcorn and pretzels. °The items listed above may not be a complete list of recommended foods or beverages. Contact your dietitian for more options. °WHAT FOODS ARE NOT RECOMMENDED? °Grains °White bread. White pasta. White rice. Refined cornbread. Bagels and croissants. Crackers that contain trans fat. °Vegetables °Creamed or fried vegetables. Vegetables in a cheese sauce. Regular canned vegetables. Regular canned tomato sauce and paste. Regular tomato and vegetable juices. °Fruits °Dried fruits. Canned fruit in light or heavy syrup. Fruit juice. °Meat and Other Protein Products °Fatty cuts of meat. Ribs, chicken wings, bacon, sausage, bologna, salami, chitterlings, fatback, hot dogs, bratwurst, and packaged luncheon meats. Salted nuts and seeds. Canned beans with salt. °Dairy °Whole or 2% milk, cream, half-and-half, and cream cheese. Whole-fat or sweetened yogurt. Full-fat   cheeses or blue cheese. Nondairy creamers and whipped toppings. Processed cheese, cheese spreads, or cheese  curds. °Condiments °Onion and garlic salt, seasoned salt, table salt, and sea salt. Canned and packaged gravies. Worcestershire sauce. Tartar sauce. Barbecue sauce. Teriyaki sauce. Soy sauce, including reduced sodium. Steak sauce. Fish sauce. Oyster sauce. Cocktail sauce. Horseradish. Ketchup and mustard. Meat flavorings and tenderizers. Bouillon cubes. Hot sauce. Tabasco sauce. Marinades. Taco seasonings. Relishes. °Fats and Oils °Butter, stick margarine, lard, shortening, ghee, and bacon fat. Coconut, palm kernel, or palm oils. Regular salad dressings. °Other °Pickles and olives. Salted popcorn and pretzels. °The items listed above may not be a complete list of foods and beverages to avoid. Contact your dietitian for more information. °WHERE CAN I FIND MORE INFORMATION? °National Heart, Lung, and [employer] Institute: www.nhlbi.nih.gov/health/health-topics/topics/dash/ °Document Released: 09/25/2011 Document Revised: 02/20/2014 Document Reviewed: 08/10/2013 °ExitCare® Patient Information ©2015 [employer]. This information is not intended to replace advice given to you by your health care provider. Make sure you discuss any questions you have with your health care provider. ° °

## 2014-10-23 NOTE — Progress Notes (Signed)
MRN: [id number] Name: Crystal Whitaker  Sex: female Age: 49 y.o. DOB: 1966/02/29  Allergies: Review of patient's allergies indicates no known allergies.  Chief Complaint  Patient presents with  . Headache    HPI: Patient is 49 y.o. female who has history of hypertension, chronic headache comes today requesting refill on her headache medications which she takes Fioricet when necessary, she describes the headache is both sided on the temporal area sharp intermittent denies any problem with the bright light or any nausea vomiting,today her blood pressure is borderline elevated her manual is 150/80 as per patient she took the blood pressure medication yesterday.pressure like to get a flu shot.  Past Medical History  Diagnosis Date  . Sinusitis   . Hypertension   . Back pain     Past Surgical History  Procedure Laterality Date  . Cesarean section        Medication List       This list is accurate as of: 10/23/14  2:45 PM.  Always use your most recent med list.               acetaminophen 500 MG tablet  Commonly known as:  TYLENOL  Take 500 mg by mouth every 6 (six) hours as needed for headache.     amitriptyline 25 MG tablet  Commonly known as:  ELAVIL  Take 1 tablet (25 mg total) by mouth at bedtime.     butalbital-acetaminophen-caffeine 50-325-40 MG per tablet  Commonly known as:  ESGIC  Take 1 tablet by mouth every 6 (six) hours as needed for headache.     lisinopril-hydrochlorothiazide 20-12.5 MG per tablet  Commonly known as:  ZESTORETIC  Take 1 tablet by mouth daily.     metoCLOPramide 10 MG tablet  Commonly known as:  REGLAN  Take 1 tablet (10 mg total) by mouth every 6 (six) hours as needed for nausea (nausea/headache).     oxyCODONE-acetaminophen 5-325 MG per tablet  Commonly known as:  PERCOCET/ROXICET  Take 1 tablet by mouth every 4 (four) hours as needed for severe pain.        Meds ordered this encounter  Medications  . amitriptyline (ELAVIL)  25 MG tablet    Sig: Take 1 tablet (25 mg total) by mouth at bedtime.    Dispense:  30 tablet    Refill:  3  . butalbital-acetaminophen-caffeine (ESGIC) 50-325-40 MG per tablet    Sig: Take 1 tablet by mouth every 6 (six) hours as needed for headache.    Dispense:  30 tablet    Refill:  0    Immunization History  Administered Date(s) Administered  . Influenza,inj,Quad PF,36+ Mos 10/23/2014    No family history on file.  History  Substance Use Topics  . Smoking status: Never Smoker   . Smokeless tobacco: Not on file  . Alcohol Use: No    Review of Systems   As noted in HPI  Filed Vitals:   10/23/14 1421  BP: 158/95  Pulse: 71  Temp: 98 F (36.7 C)  Resp: 16    Physical Exam  Physical Exam  Constitutional: She is oriented to person, place, and time. No distress.  Eyes: EOM are normal. Pupils are equal, round, and reactive to light.  Cardiovascular: Normal rate and regular rhythm.   Pulmonary/Chest: Breath sounds normal. No respiratory distress. She has no wheezes. She has no rales.  Neurological: She is alert and oriented to person, place, and time. No cranial nerve deficit.  CBC    Component Value Date/Time   WBC 14.4* 02/28/2014 0848   RBC 4.29 02/28/2014 0848   HGB 11.6* 02/28/2014 0848   HCT 35.2* 02/28/2014 0848   PLT 247 02/28/2014 0848   MCV 82.1 02/28/2014 0848   LYMPHSABS 0.8 02/28/2014 0848   MONOABS 0.8 02/28/2014 0848   EOSABS 0.0 02/28/2014 0848   BASOSABS 0.0 02/28/2014 0848    CMP     Component Value Date/Time   NA 137 02/28/2014 0848   K 3.6* 02/28/2014 0848   CL 96 02/28/2014 0848   CO2 25 02/28/2014 0848   GLUCOSE 143* 02/28/2014 0848   BUN 14 02/28/2014 0848   CREATININE 0.79 02/28/2014 0848   CREATININE 0.55 02/22/2014 1018   CALCIUM 10.2 02/28/2014 0848   PROT 7.4 02/28/2014 0848   ALBUMIN 3.8 02/28/2014 0848   AST 33 02/28/2014 0848   ALT 21 02/28/2014 0848   ALKPHOS 56 02/28/2014 0848   BILITOT 0.5 02/28/2014 0848     GFRNONAA >90 02/28/2014 0848   GFRNONAA >89 02/22/2014 1018   GFRAA >90 02/28/2014 0848   GFRAA >89 02/22/2014 1018    Lab Results  Component Value Date/Time   CHOL 230* 02/22/2014 10:18 AM    No components found for: HGA1C  Lab Results  Component Value Date/Time   AST 33 02/28/2014 08:48 AM    Assessment and Plan  Essential hypertension, benign - Plan: I have reinforced diet modification, given the handout for DASH diet, she's currently on lisinopril/hydrochlorothiazide, repeatCOMPLETE METABOLIC PANEL WITH GFR  Tension headache - Plan: trial of amitriptyline (ELAVIL) 25 MG tablet,and butalbital-acetaminophen-caffeine (ESGIC) 50-325-40 MG per table twhen necessary  Needs flu shot Flu shot given today.  Screening - Plan: MM DIGITAL SCREENING BILATERAL   Health Maintenance  -Mammogram: ordered  -Vaccinations:  Flu shot given today   Return in about 3 months (around 01/22/2015) for hypertension, headache .  Doris Cheadle, MD

## 2014-10-24 ENCOUNTER — Telehealth: Payer: Self-pay | Admitting: *Deleted

## 2014-10-24 NOTE — Telephone Encounter (Signed)
[facility] [employer] # [phone] Pt aware of lab results

## 2014-10-24 NOTE — Telephone Encounter (Signed)
-----   Message from Doris Cheadleeepak Advani, MD sent at 10/24/2014 11:13 AM EST ----- Call and let the Patient know that blood work is normal.

## 2015-03-07 ENCOUNTER — Encounter: Payer: Self-pay | Admitting: Internal Medicine

## 2015-03-07 ENCOUNTER — Ambulatory Visit: Payer: Self-pay | Attending: Internal Medicine | Admitting: Internal Medicine

## 2015-03-07 VITALS — BP 143/85 | HR 72 | Temp 98.0°F | Resp 16 | Wt 128.6 lb

## 2015-03-07 DIAGNOSIS — Z1211 Encounter for screening for malignant neoplasm of colon: Secondary | ICD-10-CM | POA: Insufficient documentation

## 2015-03-07 DIAGNOSIS — I10 Essential (primary) hypertension: Secondary | ICD-10-CM | POA: Insufficient documentation

## 2015-03-07 DIAGNOSIS — Z1231 Encounter for screening mammogram for malignant neoplasm of breast: Secondary | ICD-10-CM | POA: Insufficient documentation

## 2015-03-07 LAB — COMPLETE METABOLIC PANEL WITH GFR
ALBUMIN: 4.3 g/dL (ref 3.5–5.2)
ALT: 12 U/L (ref 0–35)
AST: 14 U/L (ref 0–37)
Alkaline Phosphatase: 53 U/L (ref 39–117)
BILIRUBIN TOTAL: 0.3 mg/dL (ref 0.2–1.2)
BUN: 13 mg/dL (ref 6–23)
CALCIUM: 10 mg/dL (ref 8.4–10.5)
CO2: 30 mEq/L (ref 19–32)
Chloride: 98 mEq/L (ref 96–112)
Creat: 0.55 mg/dL (ref 0.50–1.10)
GFR, Est African American: >89 mL/min
Glucose, Bld: 105 mg/dL — ABNORMAL HIGH (ref 70–99)
Potassium: 3.7 mEq/L (ref 3.5–5.3)
Sodium: 138 mEq/L (ref 135–145)
Total Protein: 7.2 g/dL (ref 6.0–8.3)

## 2015-03-07 MED ORDER — LISINOPRIL-HYDROCHLOROTHIAZIDE 20-12.5 MG PO TABS: 1.0000 | ORAL_TABLET | Freq: Every day | ORAL | Status: DC

## 2015-03-07 NOTE — Patient Instructions (Signed)
DASH Eating Plan °DASH stands for "Dietary Approaches to Stop Hypertension." The DASH eating plan is a healthy eating plan that has been shown to reduce high blood pressure (hypertension). Additional health benefits may include reducing the risk of type 2 diabetes mellitus, heart disease, and stroke. The DASH eating plan may also help with weight loss. °WHAT DO I NEED TO KNOW ABOUT THE DASH EATING PLAN? °For the DASH eating plan, you will follow these general guidelines: °· Choose foods with a percent daily value for sodium of less than 5% (as listed on the food label). °· Use salt-free seasonings or herbs instead of table salt or sea salt. °· Check with your health care provider or pharmacist before using salt substitutes. °· Eat lower-sodium products, often labeled as "lower sodium" or "no salt added." °· Eat fresh foods. °· Eat more vegetables, fruits, and low-fat dairy products. °· Choose whole grains. Look for the word "whole" as the first word in the ingredient list. °· Choose fish and skinless chicken or turkey more often than red meat. Limit fish, poultry, and meat to 6 oz (170 g) each day. °· Limit sweets, desserts, sugars, and sugary drinks. °· Choose heart-healthy fats. °· Limit cheese to 1 oz (28 g) per day. °· Eat more home-cooked food and less restaurant, buffet, and fast food. °· Limit fried foods. °· Cook foods using methods other than frying. °· Limit canned vegetables. If you do use them, rinse them well to decrease the sodium. °· When eating at a restaurant, ask that your food be prepared with less salt, or no salt if possible. °WHAT FOODS CAN I EAT? °Seek help from a dietitian for individual calorie needs. °Grains °Whole grain or whole wheat bread. Brown rice. Whole grain or whole wheat pasta. Quinoa, bulgur, and whole grain cereals. Low-sodium cereals. Corn or whole wheat flour tortillas. Whole grain cornbread. Whole grain crackers. Low-sodium crackers. °Vegetables °Fresh or frozen vegetables  (raw, steamed, roasted, or grilled). Low-sodium or reduced-sodium tomato and vegetable juices. Low-sodium or reduced-sodium tomato sauce and paste. Low-sodium or reduced-sodium canned vegetables.  °Fruits °All fresh, canned (in natural juice), or frozen fruits. °Meat and Other Protein Products °Ground beef (85% or leaner), grass-fed beef, or beef trimmed of fat. Skinless chicken or turkey. Ground chicken or turkey. Pork trimmed of fat. All fish and seafood. Eggs. Dried beans, peas, or lentils. Unsalted nuts and seeds. Unsalted canned beans. °Dairy °Low-fat dairy products, such as skim or 1% milk, 2% or reduced-fat cheeses, low-fat ricotta or cottage cheese, or plain low-fat yogurt. Low-sodium or reduced-sodium cheeses. °Fats and Oils °Tub margarines without trans fats. Light or reduced-fat mayonnaise and salad dressings (reduced sodium). Avocado. Safflower, olive, or canola oils. Natural peanut or almond butter. °Other °Unsalted popcorn and pretzels. °The items listed above may not be a complete list of recommended foods or beverages. Contact your dietitian for more options. °WHAT FOODS ARE NOT RECOMMENDED? °Grains °White bread. White pasta. White rice. Refined cornbread. Bagels and croissants. Crackers that contain trans fat. °Vegetables °Creamed or fried vegetables. Vegetables in a cheese sauce. Regular canned vegetables. Regular canned tomato sauce and paste. Regular tomato and vegetable juices. °Fruits °Dried fruits. Canned fruit in light or heavy syrup. Fruit juice. °Meat and Other Protein Products °Fatty cuts of meat. Ribs, chicken wings, bacon, sausage, bologna, salami, chitterlings, fatback, hot dogs, bratwurst, and packaged luncheon meats. Salted nuts and seeds. Canned beans with salt. °Dairy °Whole or 2% milk, cream, half-and-half, and cream cheese. Whole-fat or sweetened yogurt. Full-fat   cheeses or blue cheese. Nondairy creamers and whipped toppings. Processed cheese, cheese spreads, or cheese  curds. °Condiments °Onion and garlic salt, seasoned salt, table salt, and sea salt. Canned and packaged gravies. Worcestershire sauce. Tartar sauce. Barbecue sauce. Teriyaki sauce. Soy sauce, including reduced sodium. Steak sauce. Fish sauce. Oyster sauce. Cocktail sauce. Horseradish. Ketchup and mustard. Meat flavorings and tenderizers. Bouillon cubes. Hot sauce. Tabasco sauce. Marinades. Taco seasonings. Relishes. °Fats and Oils °Butter, stick margarine, lard, shortening, ghee, and bacon fat. Coconut, palm kernel, or palm oils. Regular salad dressings. °Other °Pickles and olives. Salted popcorn and pretzels. °The items listed above may not be a complete list of foods and beverages to avoid. Contact your dietitian for more information. °WHERE CAN I FIND MORE INFORMATION? °National Heart, Lung, and [employer] Institute: www.nhlbi.nih.gov/health/health-topics/topics/dash/ °Document Released: 09/25/2011 Document Revised: 02/20/2014 Document Reviewed: 08/10/2013 °ExitCare® Patient Information ©2015 [employer]. This information is not intended to replace advice given to you by your health care provider. Make sure you discuss any questions you have with your health care provider. ° °

## 2015-03-07 NOTE — Progress Notes (Signed)
Patient here for follow up on her hypertension And for medication refill

## 2015-03-07 NOTE — Progress Notes (Signed)
MRN: [id number] Name: Crystal Whitaker  Sex: female Age: 49 y.o. DOB: 02/23/1966  Allergies: Review of patient's allergies indicates no known allergies.  Chief Complaint  Patient presents with  . Follow-up    HPI: Patient is 49 y.o. female who has history of hypertension comes today for followup,she is requesting refill on her blood pressure medication, today blood pressure is borderline elevated, she denies any headache dizziness chest and shortness of breath, patient has not been compliant with low salt diet, patient is also due for mammogram and colonoscopy.  Past Medical History  Diagnosis Date  . Sinusitis   . Hypertension   . Back pain     Past Surgical History  Procedure Laterality Date  . Cesarean section        Medication List       This list is accurate as of: 03/07/15  3:03 PM.  Always use your most recent med list.               acetaminophen 500 MG tablet  Commonly known as:  TYLENOL  Take 500 mg by mouth every 6 (six) hours as needed for headache.     amitriptyline 25 MG tablet  Commonly known as:  ELAVIL  Take 1 tablet (25 mg total) by mouth at bedtime.     butalbital-acetaminophen-caffeine 50-325-40 MG per tablet  Commonly known as:  ESGIC  Take 1 tablet by mouth every 6 (six) hours as needed for headache.     lisinopril-hydrochlorothiazide 20-12.5 MG per tablet  Commonly known as:  ZESTORETIC  Take 1 tablet by mouth daily.     metoCLOPramide 10 MG tablet  Commonly known as:  REGLAN  Take 1 tablet (10 mg total) by mouth every 6 (six) hours as needed for nausea (nausea/headache).     oxyCODONE-acetaminophen 5-325 MG per tablet  Commonly known as:  PERCOCET/ROXICET  Take 1 tablet by mouth every 4 (four) hours as needed for severe pain.        Meds ordered this encounter  Medications  . lisinopril-hydrochlorothiazide (ZESTORETIC) 20-12.5 MG per tablet    Sig: Take 1 tablet by mouth daily.    Dispense:  90 tablet    Refill:  3     Immunization History  Administered Date(s) Administered  . Influenza,inj,Quad PF,36+ Mos 10/23/2014    History reviewed. No pertinent family history.  History  Substance Use Topics  . Smoking status: Never Smoker   . Smokeless tobacco: Not on file  . Alcohol Use: No    Review of Systems   As noted in HPI  Filed Vitals:   03/07/15 1410  BP: 143/85  Pulse: 72  Temp: 98 F (36.7 C)  Resp: 16    Physical Exam  Physical Exam  Constitutional: No distress.  Eyes: EOM are normal. Pupils are equal, round, and reactive to light.  Cardiovascular: Normal rate and regular rhythm.   Pulmonary/Chest: Breath sounds normal. No respiratory distress. She has no wheezes. She has no rales.  Musculoskeletal: She exhibits no edema.    CBC    Component Value Date/Time   WBC 14.4* 02/28/2014 0848   RBC 4.29 02/28/2014 0848   HGB 11.6* 02/28/2014 0848   HCT 35.2* 02/28/2014 0848   PLT 247 02/28/2014 0848   MCV 82.1 02/28/2014 0848   LYMPHSABS 0.8 02/28/2014 0848   MONOABS 0.8 02/28/2014 0848   EOSABS 0.0 02/28/2014 0848   BASOSABS 0.0 02/28/2014 0848    CMP     Component Value  Date/Time   NA 138 10/23/2014 1445   K 4.3 10/23/2014 1445   CL 99 10/23/2014 1445   CO2 31 10/23/2014 1445   GLUCOSE 92 10/23/2014 1445   BUN 14 10/23/2014 1445   CREATININE 0.66 10/23/2014 1445   CREATININE 0.79 02/28/2014 0848   CALCIUM 9.8 10/23/2014 1445   PROT 7.1 10/23/2014 1445   ALBUMIN 4.2 10/23/2014 1445   AST 13 10/23/2014 1445   ALT 13 10/23/2014 1445   ALKPHOS 58 10/23/2014 1445   BILITOT 0.3 10/23/2014 1445   GFRNONAA >89 10/23/2014 1445   GFRNONAA >90 02/28/2014 0848   GFRAA >89 10/23/2014 1445   GFRAA >90 02/28/2014 0848    Lab Results  Component Value Date/Time   CHOL 230* 02/22/2014 10:18 AM    No results found for: HGBA1C  Lab Results  Component Value Date/Time   AST 13 10/23/2014 02:45 PM    Assessment and Plan  Essential hypertension, benign -  Plan:blood pressure is borderline elevated advised patient for DASH diet, continue with lisinopril-hydrochlorothiazide (ZESTORETIC) 20-12.5 MG per tablet, COMPLETE METABOLIC PANEL WITH GFR  Encounter for screening mammogram for breast cancer - Plan: MM DIGITAL SCREENING BILATERAL  Special screening for malignant neoplasms, colon - Plan: Ambulatory referral to Gastroenterology   Health Maintenance -Colonoscopy:referred to GI  -Mammogram:ordered   Return in about 3 months (around 06/07/2015), or if symptoms worsen or fail to improve, for hypertension.   This note has been created with Education officer, [employer] speech recognition software and smart phrase technology. Any transcriptional errors are unintentional.    Doris CheadleADVANI, Doris Gruhn, MD

## 2015-03-21 ENCOUNTER — Ambulatory Visit: Payer: Self-pay | Attending: Internal Medicine

## 2015-07-13 ENCOUNTER — Encounter: Payer: Self-pay | Admitting: Physician Assistant

## 2015-07-13 ENCOUNTER — Ambulatory Visit: Payer: Self-pay | Attending: Physician Assistant | Admitting: Physician Assistant

## 2015-07-13 VITALS — BP 137/81 | HR 70 | Temp 98.6°F | Resp 18 | Ht 60.0 in | Wt 122.0 lb

## 2015-07-13 DIAGNOSIS — M5441 Lumbago with sciatica, right side: Secondary | ICD-10-CM | POA: Insufficient documentation

## 2015-07-13 MED ORDER — CYCLOBENZAPRINE HCL 10 MG PO TABS: 10.0000 mg | ORAL_TABLET | Freq: Two times a day (BID) | ORAL | Status: DC | PRN

## 2015-07-13 NOTE — Progress Notes (Signed)
Chief Complaint: Back pain  Subjective: This is a 49 year old Arabic speaking female who presented to the clinic with back pain for the last 2 weeks. She has not had injury to the area. She's not been doing any heavy or strenuous activities. She states that it started in the right hip to right lower back area and goes down the right leg. She has no numbness or tingling. Is intermittent. Sometimes she'll take ibuprofen and gets temporary relief. She has limited movement. She is able to sleep. No incontinence of bowel or bladder.   ROS:  GEN: denies fever or chills, denies change in weight EXT: + muscle spasms or swelling; + pain in lower ext, + weakness NEURO: denies numbness or tingling, denies sz, stroke or TIA   Objective:  Filed Vitals:   07/13/15 1036 07/13/15 1049  BP: 158/78 137/81  Pulse: 70   Temp: 98.6 F (37 C)   TempSrc: Oral   Resp: 18   Height: 5' (1.524 m)   Weight: 122 lb (55.339 kg)   SpO2: 98%     Physical Exam:  General: in no acute distress. Extremities: No clubbing cyanosis or edema with positive pedal pulses. Tender to palpation of lower back, decreased ROM. Neuro: Alert, awake, oriented x3, nonfocal.   Medications: Prior to Admission medications   Medication Sig Start Date End Date Taking? Authorizing Provider  acetaminophen (TYLENOL) 500 MG tablet Take 500 mg by mouth every 6 (six) hours as needed for headache.   Yes Historical Provider, MD  lisinopril-hydrochlorothiazide (ZESTORETIC) 20-12.5 MG per tablet Take 1 tablet by mouth daily. 03/07/15  Yes Doris Cheadle, MD  amitriptyline (ELAVIL) 25 MG tablet Take 1 tablet (25 mg total) by mouth at bedtime. Patient not taking: Reported on 07/13/2015 10/23/14   Doris Cheadle, MD  butalbital-acetaminophen-caffeine (ESGIC) 50-325-40 MG per tablet Take 1 tablet by mouth every 6 (six) hours as needed for headache. Patient not taking: Reported on 07/13/2015 10/23/14   Doris Cheadle, MD  cyclobenzaprine (FLEXERIL) 10 MG  tablet Take 1 tablet (10 mg total) by mouth 2 (two) times daily as needed for muscle spasms. 07/13/15   Tiffany Netta Cedars, PA-C  metoCLOPramide (REGLAN) 10 MG tablet Take 1 tablet (10 mg total) by mouth every 6 (six) hours as needed for nausea (nausea/headache). Patient not taking: Reported on 07/13/2015 02/28/14   Richardean Canal, MD  oxyCODONE-acetaminophen (PERCOCET/ROXICET) 5-325 MG per tablet Take 1 tablet by mouth every 4 (four) hours as needed for severe pain. Patient not taking: Reported on 07/13/2015 03/02/14   Raeford Razor, MD    Assessment: 1. Right-sided low back pain   Plan: Flexeril twice daily as needed Stretching exercises given Continue with over-the-counter ibuprofen as needed  Follow up: As needed/as scheduled  The patient was given clear instructions to go to ER or return to medical center if symptoms don't improve, worsen or new problems develop. The patient verbalized understanding. The patient was told to call to get lab results if they haven't heard anything in the next week.   This note has been created with Education officer, environmental. Any transcriptional errors are unintentional.   Scot Jun, PA-C 07/13/2015, 11:25 AM

## 2015-07-13 NOTE — Progress Notes (Signed)
Patient here for back pain in right, lower back, radiates down right leg, described as sharp, at level 8. Patient has to take time to move in the morning, gets better after moving around.

## 2015-07-25 ENCOUNTER — Encounter: Payer: Self-pay | Admitting: Family Medicine

## 2015-07-25 ENCOUNTER — Telehealth: Payer: Self-pay | Admitting: Physician Assistant

## 2015-07-25 ENCOUNTER — Ambulatory Visit: Payer: Self-pay | Attending: Family Medicine | Admitting: Family Medicine

## 2015-07-25 DIAGNOSIS — M545 Low back pain, unspecified: Secondary | ICD-10-CM

## 2015-07-25 MED ORDER — CYCLOBENZAPRINE HCL 10 MG PO TABS: 10.0000 mg | ORAL_TABLET | Freq: Two times a day (BID) | ORAL | Status: DC | PRN

## 2015-07-25 MED ORDER — NAPROXEN 500 MG PO TABS: 500.0000 mg | ORAL_TABLET | Freq: Two times a day (BID) | ORAL | Status: DC

## 2015-07-25 NOTE — Progress Notes (Signed)
Patient here for back pain, described as stabbing pain on right side. Pain has been present for 10 days.

## 2015-07-25 NOTE — Patient Instructions (Signed)
It is a pleasure to meet you today.    For your right-sided back pain, I am prescribing NAPROXEN  tablets, take 1 tablet by mouth every 12 hours with food.  Take for no more than 10 straight days.   Cyclobenzaprine  tablets, take 1 tablet by mouth twice a day as needed for muscle spasms.   Gentle stretching exercises periodically frequently   Follow up visit in 2 to 4 weeks or sooner if needed.

## 2015-07-25 NOTE — Telephone Encounter (Signed)
[id number] Interpreter used.  Called patient. Patient verified name and date of birth. Patient made aware that that she needs to establish care with a PCP for F/U appointment. Patient asked if she could come in on 08/02/15 at 10am to establish care with PCP and F/U for back pain. Patient expressed that she is having lots of back pain and would like to be seen sooner. Patient advised per Elmarie Shiley, NP, that if she is having lots of pain she should go to the ED to be further evaluated and make her appointment to establish care with a PCP for further care. Patient rescheduled appointment to today with Dr.Breen at 2:30pm.

## 2015-07-25 NOTE — Progress Notes (Signed)
   Subjective:    Patient ID: Crystal Whitaker, female    DOB: 21-Aug-1966, 49 y.o.   MRN: [id number]  HPI Patient seen with telephone Arabic interpreter today.  Patient for follow up of R sided lower back pain which radiates to back of leg to level of the knee. No urinary incontinence, no bowel changes or blood per rectum, no hematuria, no diarrhea, no dysuria or frequency.  No fevers or chills. No abdominal pain.   Review of labs done earlier this year shows normal renal function.   She has taken cyclobenzaprine which helps the pain. Topical menthol cream helps as well.     Review of Systems     Objective:   Physical Exam Well appearing, no distress HEENT Neck supple, no cervical adenopathy  MSK: SLR of R leg full ROM, no limitations. Tenderness to palpate para spinous muscles along R lower back (lumbar). No tenderness over vertebral processes. No CVA tenderness.  ABD No masses or megaly.  No suprapubic tenderness.  EXTS no lower extremity edema. Palpable dp pulses, sensation grossly intact in both feet/toes.       Assessment & Plan:

## 2016-01-04 ENCOUNTER — Ambulatory Visit: Payer: Self-pay | Attending: Internal Medicine

## 2016-03-20 ENCOUNTER — Ambulatory Visit: Payer: Self-pay | Attending: Internal Medicine | Admitting: Physician Assistant

## 2016-03-20 ENCOUNTER — Encounter: Payer: Self-pay | Admitting: Physician Assistant

## 2016-03-20 VITALS — BP 153/91 | HR 67 | Temp 98.3°F | Wt 128.4 lb

## 2016-03-20 DIAGNOSIS — I10 Essential (primary) hypertension: Secondary | ICD-10-CM | POA: Insufficient documentation

## 2016-03-20 DIAGNOSIS — M549 Dorsalgia, unspecified: Secondary | ICD-10-CM | POA: Insufficient documentation

## 2016-03-20 DIAGNOSIS — Z79899 Other long term (current) drug therapy: Secondary | ICD-10-CM | POA: Insufficient documentation

## 2016-03-20 DIAGNOSIS — Z760 Encounter for issue of repeat prescription: Secondary | ICD-10-CM | POA: Insufficient documentation

## 2016-03-20 LAB — COMPREHENSIVE METABOLIC PANEL
ALBUMIN: 4.3 g/dL (ref 3.6–5.1)
ALT: 12 U/L (ref 6–29)
AST: 13 U/L (ref 10–35)
Alkaline Phosphatase: 49 U/L (ref 33–130)
BUN: 11 mg/dL (ref 7–25)
CALCIUM: 9.8 mg/dL (ref 8.6–10.4)
CHLORIDE: 102 mmol/L (ref 98–110)
CO2: 26 mmol/L (ref 20–31)
Creat: 0.59 mg/dL (ref 0.50–1.05)
Glucose, Bld: 87 mg/dL (ref 65–99)
POTASSIUM: 3.4 mmol/L — AB (ref 3.5–5.3)
Sodium: 140 mmol/L (ref 135–146)
TOTAL PROTEIN: 7.2 g/dL (ref 6.1–8.1)
Total Bilirubin: 0.4 mg/dL (ref 0.2–1.2)

## 2016-03-20 MED ORDER — LISINOPRIL-HYDROCHLOROTHIAZIDE 20-12.5 MG PO TABS: 1.0000 | ORAL_TABLET | Freq: Every day | ORAL | Status: DC

## 2016-03-20 NOTE — Progress Notes (Signed)
Patient ID: Crystal Whitaker, female   DOB: [birth year], 50 y.o.   MRN: [id number]   Khrista Darnell, is a 50 y.o. female  [id number]  [id number]  DOB - 06-Jun-1966  Chief Complaint  Patient presents with  . Medication Refill        Subjective:  Chief Complaint and HPI: Crystal Whitaker is a 50 y.o. female here today for BP med refill.  She says her BP has been <140/80 when she checks it outside of the office.   She denies vision changes, palpitations, CP, or HAs.  She is leaving in 8 days to travel to [place] for 3 months.  She is feeling anxious about getting everything ready for her trip and being away for so long.  She does not have any complaints today.    ROS:   Constitutional:  No f/c, No night sweats, No unexplained weight loss. EENT:  No vision changes, No blurry vision, No hearing changes. No mouth, throat, or ear problems.  Respiratory: No cough, No SOB Cardiac: No CP, no palpitations GI:  No abd pain, No N/V/D. GU: No Urinary s/sx Musculoskeletal: No joint pain Neuro: No headache, no dizziness, no motor weakness.  Skin: No rash Endocrine:  No polydipsia. No polyuria.  Psych: Denies SI/HI  No problems updated.  ALLERGIES: No Known Allergies  PAST MEDICAL HISTORY: Past Medical History  Diagnosis Date  . Sinusitis   . Hypertension   . Back pain     MEDICATIONS AT HOME: Prior to Admission medications   Medication Sig Start Date End Date Taking? Authorizing Provider  acetaminophen (TYLENOL) 500 MG tablet Take 500 mg by mouth every 6 (six) hours as needed for headache.   Yes Historical Provider, MD  butalbital-acetaminophen-caffeine (ESGIC) 50-325-40 MG per tablet Take 1 tablet by mouth every 6 (six) hours as needed for headache. 10/23/14  Yes Doris Cheadle, MD  cyclobenzaprine (FLEXERIL) 10 MG tablet Take 1 tablet (10 mg total) by mouth 2 (two) times daily as needed for muscle spasms. 07/25/15  Yes Barbaraann Barthel, MD  lisinopril-hydrochlorothiazide (ZESTORETIC)  20-12.5 MG tablet Take 1 tablet by mouth daily. 03/20/16  Yes Anders Simmonds, PA-C     Objective:  EXAMCeasar Mons Vitals:   03/20/16 1450  BP: 153/91  Pulse: 67  Temp: 98.3 F (36.8 C)  TempSrc: Oral  Weight: 128 lb 6.4 oz (58.242 kg)    General appearance : A&OX3. NAD. Non-toxic-appearing Neck: supple, no JVD. No cervical lymphadenopathy. No thyromegaly Chest/Lungs:  Breathing-non-labored, Good air entry bilaterally, breath sounds normal without rales, rhonchi, or wheezing  CVS: S1 S2 regular, no murmurs, gallops, rubs  Extremities: Bilateral Lower Ext shows no edema, both legs are warm to touch with = pulse throughout Neurology:  CN II-XII grossly intact, Non focal.   Psych:  TP linear. J/I WNL. Normal speech. Appropriate eye contact and affect.  Skin:  No Rash  Assessment & Plan   1. Essential hypertension, benign Sub-optimal control.  She will check this outside of the office.  She will also be able to check her BP in the [place] and have medical care there if it is consistently high.  She may need medication adjustments if it continues as it is today.  Patient expresses understanding.   - lisinopril-hydrochlorothiazide (ZESTORETIC) 20-12.5 MG tablet; Take 1 tablet by mouth daily.  Dispense: 90 tablet; Refill: 3 - Comprehensive metabolic panel We have discussed target BP range and blood pressure goal. I have advised patient to check BP regularly  and to call us back or report to clinic if the numbers are consistently higher than 140/90. We discussed the importance of compliance with medical therapy and DASH diet recommended, consequences of uncontrolled hypertension discussed.   Patient have been counseled extensively about nutrition and exercise  Return in about 3 months (around 06/20/2016) for bp check.  The patient was given clear instructions to go to ER or return to medical center if symptoms don't improve, worsen or new problems develop. The patient verbalized understanding.  The patient was told to call to get lab results if they haven't heard anything in the next week.     Georgian CoAngela Christyann Manolis, PA-C [facility] [place], [place] [phone]   03/20/2016, 3:02 PM

## 2016-08-06 ENCOUNTER — Ambulatory Visit: Payer: Self-pay | Attending: Internal Medicine

## 2016-09-12 ENCOUNTER — Encounter (HOSPITAL_COMMUNITY): Payer: Self-pay | Admitting: Emergency Medicine

## 2016-09-12 ENCOUNTER — Emergency Department (HOSPITAL_COMMUNITY)
Admission: EM | Admit: 2016-09-12 | Discharge: 2016-09-12 | Disposition: A | Discharge status: Home / Self Care | Payer: Self-pay | Attending: Emergency Medicine | Admitting: Emergency Medicine

## 2016-09-12 DIAGNOSIS — M5442 Lumbago with sciatica, left side: Secondary | ICD-10-CM | POA: Insufficient documentation

## 2016-09-12 DIAGNOSIS — I10 Essential (primary) hypertension: Secondary | ICD-10-CM | POA: Insufficient documentation

## 2016-09-12 DIAGNOSIS — Z79899 Other long term (current) drug therapy: Secondary | ICD-10-CM | POA: Insufficient documentation

## 2016-09-12 DIAGNOSIS — M5432 Sciatica, left side: Secondary | ICD-10-CM

## 2016-09-12 MED ORDER — NAPROXEN 500 MG PO TABS: 500.0000 mg | ORAL_TABLET | Freq: Two times a day (BID) | ORAL | 0 refills | Status: DC

## 2016-09-12 MED ORDER — METHOCARBAMOL 500 MG PO TABS: 500.0000 mg | ORAL_TABLET | Freq: Two times a day (BID) | ORAL | 0 refills | Status: DC | PRN

## 2016-09-12 MED ORDER — ACETAMINOPHEN 325 MG PO TABS
650.0000 mg | ORAL_TABLET | Freq: Once | ORAL | Status: AC
  Administered 2016-09-12: 650 mg via ORAL
  Filled 2016-09-12: qty 2

## 2016-09-12 NOTE — ED Provider Notes (Signed)
[facility]-EMERGENCY DEPT Provider Note    By signing my name below, I, Earmon PhoenixJennifer Waddell, attest that this documentation has been prepared under the direction and in the presence of Will Adilen Pavelko, PA-C. Electronically Signed: Earmon PhoenixJennifer Waddell, ED Scribe. 09/12/16. 1:37 PM.    History   Chief Complaint Chief Complaint  Patient presents with  . Leg Pain    The history is provided by the patient and medical records. No language interpreter was used.    HPI Comments:  Crystal Whitaker is a 50 y.o. female with PMHx of chronic back pain who presents to the Emergency Department complaining of left sided low back pain that began yesterday. She states the pain radiates through her left buttock into her upper left posterior thigh. She has taken Ibuprofen for pain and used a muscle rub. Walking increases the pain. She denies alleviating factors. She denies fever, chills, nausea, vomiting, bowel or bladder incontinence, abdominal pain, dysuria, hematuria, rash, numbness, tingling or weakness of the lower extremities. She denies h/o cancer or IV drug use. She denies any trauma, injury or fall. She denies any kidney problems. She does not have a PCP.  Past Medical History:  Diagnosis Date  . Back pain   . Hypertension   . Sinusitis     Patient Active Problem List   Diagnosis Date Noted  . Headache(784.0) 02/22/2014  . Knee pain, acute 04/21/2013  . HYPERTENSION, BENIGN ESSENTIAL 12/15/2008  . DEGENERATIVE JOINT DISEASE, SHOULDER 12/15/2008  . LOW BACK PAIN SYNDROME 08/02/2007    Past Surgical History:  Procedure Laterality Date  . CESAREAN SECTION      OB History    No data available       Home Medications    Prior to Admission medications   Medication Sig Start Date End Date Taking? Authorizing Provider  acetaminophen (TYLENOL) 500 MG tablet Take 500 mg by mouth every 6 (six) hours as needed for headache.    Historical Provider, MD  butalbital-acetaminophen-caffeine (ESGIC)  50-325-40 MG per tablet Take 1 tablet by mouth every 6 (six) hours as needed for headache. 10/23/14   Doris Cheadleeepak Advani, MD  lisinopril-hydrochlorothiazide (ZESTORETIC) 20-12.5 MG tablet Take 1 tablet by mouth daily. 03/20/16   Anders SimmondsAngela M McClung, PA-C  methocarbamol (ROBAXIN) 500 MG tablet Take 1 tablet (500 mg total) by mouth 2 (two) times daily as needed for muscle spasms. 09/12/16   Everlene FarrierWilliam Jaylynn Siefert, PA-C  naproxen (NAPROSYN) 500 MG tablet Take 1 tablet (500 mg total) by mouth 2 (two) times daily with a meal. 09/12/16   Everlene FarrierWilliam Jarius Dieudonne, PA-C    Family History Family History  Problem Relation Age of Onset  . Family history unknown: Yes    Social History Social History  Substance Use Topics  . Smoking status: Never Smoker  . Smokeless tobacco: Never Used  . Alcohol use No     Allergies   Patient has no known allergies.   Review of Systems Review of Systems  Constitutional: Negative for chills and fever.  Gastrointestinal: Negative for abdominal pain, nausea and vomiting.  Musculoskeletal: Positive for back pain and myalgias.  Skin: Negative for rash and wound.  Neurological: Negative for weakness and numbness.     Physical Exam Updated Vital Signs BP (!) 146/106 (BP Location: Right Arm)   Pulse 83   Temp 98.8 F (37.1 C) (Oral)   Resp 17   LMP 06/04/2011   SpO2 100%   Physical Exam  Constitutional: She appears well-developed and well-nourished. No distress.  HENT:  Head: Normocephalic and atraumatic.  Eyes: Right eye exhibits no discharge. Left eye exhibits no discharge.  Cardiovascular: Normal rate, regular rhythm and intact distal pulses.   Pulmonary/Chest: Effort normal. No respiratory distress.  Musculoskeletal: Normal range of motion. She exhibits tenderness. She exhibits no edema or deformity.  No midline back tenderness. Tenderness to palpation over left low back musculature and buttocks. No back edema or deformity. No overlying skin changes to her back or buttocks.     Neurological: She is alert. She displays normal reflexes. Coordination normal.  Normal gait. Bilateral patellar DTRs are intact. Sensation is intact her bilateral lower extremities. Good strength with plantar and dorsiflexion bilaterally.  Skin: Skin is warm and dry. Capillary refill takes less than 2 seconds. No rash noted. She is not diaphoretic. No erythema. No pallor.  Psychiatric: She has a normal mood and affect. Her behavior is normal.  Nursing note and vitals reviewed.    ED Treatments / Results  DIAGNOSTIC STUDIES: Oxygen Saturation is 100% on RA, normal by my interpretation.   COORDINATION OF CARE: 1:33 PM- Will prescribe Robaxin and Naprosyn. Informed pt to discontinue taking Ibuprofen and only take Tylenol for additional pain. Return precautions discussed. Will provide back exercises for pt to do. Pt verbalizes understanding and agrees to plan.  Medications  acetaminophen (TYLENOL) tablet 650 mg (not administered)    Labs (all labs ordered are listed, but only abnormal results are displayed) Labs Reviewed - No data to display  EKG  EKG Interpretation None       Radiology No results found.  Procedures Procedures (including critical care time)  Medications Ordered in ED Medications  acetaminophen (TYLENOL) tablet 650 mg (not administered)     Initial Impression / Assessment and Plan / ED Course  I have reviewed the triage vital signs and the nursing notes.  Pertinent labs & imaging results that were available during my care of the patient were reviewed by me and considered in my medical decision making (see chart for details).  Clinical Course     Patient with lower right sided back pain radiating into right buttocks and upper right thigh that began yesterday.   Pain is consistent with sciatica. No neurological deficits and normal neuro exam.  Patient is ambulatory.  No loss of bowel or bladder control.  No concern for cauda equina.  No fever, night  sweats, weight loss, h/o cancer, IVDA, no recent procedure to back. No urinary symptoms suggestive of UTI.  Supportive care and return precaution discussed. Appears safe for discharge at this time.  I advised the patient to follow-up with their primary care provider this week. I advised the patient to return to the emergency department with new or worsening symptoms or new concerns. The patient verbalized understanding and agreement with plan.     I personally performed the services described in this documentation, which was scribed in my presence. The recorded information has been reviewed and is accurate.     Final Clinical Impressions(s) / ED Diagnoses   Final diagnoses:  Sciatica of left side    New Prescriptions New Prescriptions   METHOCARBAMOL (ROBAXIN) 500 MG TABLET    Take 1 tablet (500 mg total) by mouth 2 (two) times daily as needed for muscle spasms.   NAPROXEN (NAPROSYN) 500 MG TABLET    Take 1 tablet (500 mg total) by mouth 2 (two) times daily with a meal.     Everlene FarrierWilliam Joyleen Haselton, PA-C 09/12/16 1350    Linwood DibblesJon Knapp, MD 09/13/16  0954  

## 2016-09-12 NOTE — ED Triage Notes (Signed)
Pt. Is having left leg pain since yesterday. No injury

## 2016-10-23 ENCOUNTER — Encounter: Payer: Self-pay | Admitting: Family Medicine

## 2016-10-23 ENCOUNTER — Ambulatory Visit: Payer: Self-pay | Attending: Family Medicine | Admitting: Family Medicine

## 2016-10-23 VITALS — BP 150/94 | HR 96 | Temp 97.1°F | Resp 16 | Wt 131.0 lb

## 2016-10-23 DIAGNOSIS — I10 Essential (primary) hypertension: Secondary | ICD-10-CM | POA: Insufficient documentation

## 2016-10-23 LAB — BASIC METABOLIC PANEL
BUN: 9 mg/dL (ref 7–25)
CALCIUM: 9.7 mg/dL (ref 8.6–10.4)
CO2: 28 mmol/L (ref 20–31)
Chloride: 103 mmol/L (ref 98–110)
Creat: 0.45 mg/dL — ABNORMAL LOW (ref 0.50–1.05)
Glucose, Bld: 81 mg/dL (ref 65–99)
POTASSIUM: 3.6 mmol/L (ref 3.5–5.3)
SODIUM: 140 mmol/L (ref 135–146)

## 2016-10-23 LAB — LIPID PANEL
CHOL/HDL RATIO: 4 ratio (ref <5.0)
CHOLESTEROL: 207 mg/dL — AB (ref <200)
HDL: 52 mg/dL (ref >50)
LDL Cholesterol: 126 mg/dL — ABNORMAL HIGH (ref <100)
Triglycerides: 145 mg/dL (ref <150)
VLDL: 29 mg/dL (ref <30)

## 2016-10-23 MED ORDER — LISINOPRIL-HYDROCHLOROTHIAZIDE 20-12.5 MG PO TABS: 1.0000 | ORAL_TABLET | Freq: Every day | ORAL | 2 refills | Status: DC

## 2016-10-23 NOTE — Progress Notes (Signed)
   Subjective:  Patient ID: Crystal Whitaker, female    DOB: 03/30/1966  Age: 51 y.o. MRN: [id number]  CC: No chief complaint on file.  HPI Crystal Whitaker presents for HTN. Denies any CP, SOB, swelling of the extremities, or headaches. She also reports an episode of left hand numbness 1 month ago which was relieved by arm elevation. Denies any present numbness. Denies any injury, swelling, temperature changes, or weakness or decreased grip to the extremity. Reports sleeping on her side with hand under pillow. Recommended changing sleeping position.   Outpatient Medications Prior to Visit  Medication Sig Dispense Refill  . acetaminophen (TYLENOL) 500 MG tablet Take 500 mg by mouth every 6 (six) hours as needed for headache.    . butalbital-acetaminophen-caffeine (ESGIC) 50-325-40 MG per tablet Take 1 tablet by mouth every 6 (six) hours as needed for headache. 30 tablet 0  . lisinopril-hydrochlorothiazide (ZESTORETIC) 20-12.5 MG tablet Take 1 tablet by mouth daily. 90 tablet 3  . methocarbamol (ROBAXIN) 500 MG tablet Take 1 tablet (500 mg total) by mouth 2 (two) times daily as needed for muscle spasms. 20 tablet 0  . naproxen (NAPROSYN) 500 MG tablet Take 1 tablet (500 mg total) by mouth 2 (two) times daily with a meal. 30 tablet 0   No facility-administered medications prior to visit.    ROS Review of Systems  Eyes: Negative.   Respiratory: Negative.   Cardiovascular: Negative.   Gastrointestinal: Negative.   Neurological: Numbness: episode of hand numbness 1 month ago.   Objective:  LMP 06/04/2011   BP/Weight 09/12/2016 03/20/2016 07/25/2015  Systolic BP 146 153 142  Diastolic BP 106 91 87  Wt. (Lbs) - 128.4 125.2  BMI - 25.92 25.27   Physical Exam  Constitutional: She is oriented to person, place, and time.  Eyes: Pupils are equal, round, and reactive to light.  Neck: Normal range of motion. No JVD present.  Cardiovascular: Normal rate, regular rhythm and normal heart sounds.     Pulmonary/Chest: Effort normal and breath sounds normal.  Musculoskeletal:       Right hand: Normal.       Left hand: Normal.  No weakness or decreased grip present.  Neurological: She is alert and oriented to person, place, and time.  Skin: Skin is warm and dry.   Assessment & Plan:   Problem List Items Addressed This Visit      Cardiovascular and Mediastinum   HYPERTENSION, BENIGN ESSENTIAL - Primary   Relevant Medications   lisinopril-hydrochlorothiazide (ZESTORETIC) 20-12.5 MG tablet   Other Relevant Orders   Microalbumin/Creatinine Ratio, Urine (Completed)   Basic Metabolic Panel (Completed)   Lipid panel (Completed)     Meds ordered this encounter  Medications  . lisinopril-hydrochlorothiazide (ZESTORETIC) 20-12.5 MG tablet    Sig: Take 1 tablet by mouth daily.    Dispense:  30 tablet    Refill:  2    Order Specific Question:   Supervising Provider    Answer:   Quentin AngstJEGEDE, OLUGBEMIGA E [id number]]    Follow-up: Return in about 3 months (around 01/21/2017) for Hypertension.   Lizbeth BarkMandesia R Hairston FNP

## 2016-10-23 NOTE — Progress Notes (Signed)
Last dose of bp med was one week ago.  Needs refill Lisinopril. Denies any dizziness

## 2016-10-23 NOTE — Patient Instructions (Signed)
Hypertension  Managing Your Hypertension Hypertension is commonly called high blood pressure. Blood pressure is a measurement of how strongly your blood is pressing against the walls of your arteries. Arteries are blood vessels that carry blood from your heart throughout your body. Blood pressure does not stay the same. It rises when you are active, excited, or nervous. It lowers when you are sleeping or relaxed. If the numbers that measure your blood pressure stay above normal most of the time, you are at risk for health problems. Hypertension is a long-term (chronic) condition in which blood pressure is elevated. This condition often has no signs or symptoms. The cause of the condition is usually not known. What are blood pressure readings? A blood pressure reading is recorded as two numbers, such as "120 over 80" (or 120/80). The first ("top") number is called the systolic pressure. It is a measure of the pressure in your arteries as the heart beats. The second ("bottom") number is called the diastolic pressure. It is a measure of the pressure in your arteries as the heart relaxes between beats. What does my blood pressure reading mean? Blood pressure is classified into four stages. Based on your blood pressure reading, your health care provider may use the following stages to determine what type of treatment, if any, is needed. Systolic pressure and diastolic pressure are measured in a unit called mm Hg. Normal  Systolic pressure: below 120.  Diastolic pressure: below 80. Prehypertension  Systolic pressure: 120-139.  Diastolic pressure: 80-89. Hypertension stage 1  Systolic pressure: 140-159.  Diastolic pressure: 90-99. Hypertension stage 2  Systolic pressure: 160 or above.  Diastolic pressure: 100 or above. What health risks are associated with hypertension? Managing your hypertension is an important responsibility. Uncontrolled hypertension can lead to:  A heart attack.  A  stroke.  A weakened blood vessel (aneurysm).  Heart failure.  Kidney damage.  Eye damage.  Metabolic syndrome.  Memory and concentration problems. What changes can I make to manage my hypertension? Hypertension can be managed effectively by making lifestyle changes and possibly by taking medicines. Your health care provider will help you come up with a plan to bring your blood pressure within a normal range. Your plan should include the following: Monitoring  Monitor your blood pressure at home as told by your health care provider. Your personal target blood pressure may vary depending on your medical conditions, your age, and other factors.  Have your blood pressure rechecked as told by your health care provider. Lifestyle  Lose weight if necessary.  Get at least 30-45 minutes of aerobic exercise at least 4 times a week.  Do not use any products that contain nicotine or tobacco, such as cigarettes and e-cigarettes. If you need help quitting, ask your health care provider.  Learn ways to reduce stress.  Control any chronic conditions, such as high cholesterol or diabetes. Eating and drinking  Follow the DASH diet. This diet is high in fruits, vegetables, and whole grains. It is low in salt, red meat, and added sugars.  Keep your sodium intake below 2,300 mg per day.  Limit alcoholic beverages. Communication  Review all the medicines you take with your health care provider because there may be side effects or interactions.  Talk with your health care provider about your diet, exercise habits, and other lifestyle factors that may be contributing to hypertension.  See your health care provider regularly. Your health care provider can help you create and adjust your plan for managing  hypertension. Will I need medicine to control my blood pressure? Your health care provider may prescribe medicine if lifestyle changes are not enough to get your blood pressure under control,  and if one of the following is true:  You are 74-46 years of age, and your systolic blood pressure is 140 or higher.  You are 42 years of age or older, and your systolic blood pressure is 150 or higher.  Your diastolic blood pressure is 90 or higher.  You have diabetes, and your systolic blood pressure is over 140 or your diastolic blood pressure is over 90.  You have kidney disease, and your blood pressure is above 140/90.  You have heart disease or a history of stroke, and your blood pressure is 140/90 or higher. Take medicines only as told by your health care provider. Follow the directions carefully. Blood pressure medicines must be taken as prescribed. The medicine does not work as well when you skip doses. Skipping doses also puts you at risk for problems. Contact a health care provider if:  You think you are having a reaction to medicines you have taken.  You have repeated (recurrent) headaches.  You feel dizzy.  You have swelling in your ankles.  You have trouble with your vision. Get help right away if:  You develop a severe headache or confusion.  You have unusual weakness or numbness, or you feel faint.  You have severe pain in your chest or abdomen.  You vomit repeatedly.  You have trouble breathing. This information is not intended to replace advice given to you by your health care provider. Make sure you discuss any questions you have with your health care provider. Document Released: 06/30/2012 Document Revised: 06/10/2016 Document Reviewed: 01/04/2016 Elsevier Interactive Patient Education  2017 [employer]. Hypertension is another name for high blood pressure. High blood pressure forces your heart to work harder to pump blood. A blood pressure reading has two numbers, which includes a higher number over a lower number (example: 110/72). Follow these instructions at home:  Have your blood pressure rechecked by your doctor.  Only take medicine as told by  your doctor. Follow the directions carefully. The medicine does not work as well if you skip doses. Skipping doses also puts you at risk for problems.  Do not smoke.  Monitor your blood pressure at home as told by your doctor. Contact a doctor if:  You think you are having a reaction to the medicine you are taking.  You have repeat headaches or feel dizzy.  You have puffiness (swelling) in your ankles.  You have trouble with your vision. Get help right away if:  You get a very bad headache and are confused.  You feel weak, numb, or faint.  You get chest or belly (abdominal) pain.  You throw up (vomit).  You cannot breathe very well. This information is not intended to replace advice given to you by your health care provider. Make sure you discuss any questions you have with your health care provider. Document Released: 03/24/2008 Document Revised: 03/13/2016 Document Reviewed: 07/29/2013 Elsevier Interactive Patient Education  2017 [employer].

## 2016-10-24 LAB — MICROALBUMIN / CREATININE URINE RATIO
Creatinine, Urine: 126 mg/dL (ref 20–320)
Microalb Creat Ratio: 4 mcg/mg creat (ref <30)
Microalb, Ur: 0.5 mg/dL

## 2016-10-28 ENCOUNTER — Ambulatory Visit: Payer: Self-pay | Attending: Family Medicine | Admitting: Family Medicine

## 2016-10-28 ENCOUNTER — Encounter: Payer: Self-pay | Admitting: Family Medicine

## 2016-10-28 VITALS — BP 128/76 | HR 81 | Temp 98.1°F | Resp 18 | Ht 60.0 in | Wt 130.8 lb

## 2016-10-28 DIAGNOSIS — J3489 Other specified disorders of nose and nasal sinuses: Secondary | ICD-10-CM | POA: Insufficient documentation

## 2016-10-28 DIAGNOSIS — R05 Cough: Secondary | ICD-10-CM | POA: Insufficient documentation

## 2016-10-28 DIAGNOSIS — J00 Acute nasopharyngitis [common cold]: Secondary | ICD-10-CM

## 2016-10-28 DIAGNOSIS — R51 Headache: Secondary | ICD-10-CM | POA: Insufficient documentation

## 2016-10-28 MED ORDER — FLUTICASONE PROPIONATE 50 MCG/ACT NA SUSP: 2.0000 | Freq: Every day | NASAL | 6 refills | Status: DC

## 2016-10-28 MED ORDER — GUAIFENESIN-DM 100-10 MG/5ML PO SYRP: 5.0000 mL | ORAL_SOLUTION | ORAL | 1 refills | Status: DC | PRN

## 2016-10-28 NOTE — Patient Instructions (Signed)
Upper Respiratory Infection, Adult Most upper respiratory infections (URIs) are caused by a virus. A URI affects the nose, throat, and upper air passages. The most common type of URI is often called "the common cold." Follow these instructions at home:  Take medicines only as told by your doctor.  Gargle warm saltwater or take cough drops to comfort your throat as told by your doctor.  Use a warm mist humidifier or inhale steam from a shower to increase air moisture. This may make it easier to breathe.  Drink enough fluid to keep your pee (urine) clear or pale yellow.  Eat soups and other clear broths.  Have a healthy diet.  Rest as needed.  Go back to work when your fever is gone or your doctor says it is okay.  You may need to stay home longer to avoid giving your URI to others.  You can also wear a face mask and wash your hands often to prevent spread of the virus.  Use your inhaler more if you have asthma.  Do not use any tobacco products, including cigarettes, chewing tobacco, or electronic cigarettes. If you need help quitting, ask your doctor. Contact a doctor if:  You are getting worse, not better.  Your symptoms are not helped by medicine.  You have chills.  You are getting more short of breath.  You have brown or red mucus.  You have yellow or brown discharge from your nose.  You have pain in your face, especially when you bend forward.  You have a fever.  You have puffy (swollen) neck glands.  You have pain while swallowing.  You have white areas in the back of your throat. Get help right away if:  You have very bad or constant:  Headache.  Ear pain.  Pain in your forehead, behind your eyes, and over your cheekbones (sinus pain).  Chest pain.  You have long-lasting (chronic) lung disease and any of the following:  Wheezing.  Long-lasting cough.  Coughing up blood.  A change in your usual mucus.  You have a stiff neck.  You have  changes in your:  Vision.  Hearing.  Thinking.  Mood. This information is not intended to replace advice given to you by your health care provider. Make sure you discuss any questions you have with your health care provider. Document Released: 03/24/2008 Document Revised: 06/08/2016 Document Reviewed: 01/11/2014 Elsevier Interactive Patient Education  2017 [employer]  

## 2016-10-28 NOTE — Progress Notes (Signed)
Patient is here for cold symptoms: runny/stuffy nose, Headache, a lil bit of coughing been like these since 10/25/2016  Patient is taking day/night time for cold   Patient declined the flu shot today

## 2016-10-28 NOTE — Progress Notes (Signed)
Subjective:  Patient ID: Crystal Whitaker, female    DOB: [birth year]  Age: 50 y.o. MRN: [id number]  CC: Cough   HPI  Crystal Whitaker presents for c/o cold symptoms. She reports rhinorrhea with yellow nasal drainage and dry cough. She denies any fevers,chills, body aches, sore throat, or watery eyes. She reports slight frontal sinus pressure. She reports being around her daughter who had similar symptoms. Reports taking tylenol nighttime and daytime with minimal relief of symptoms.  Outpatient Medications Prior to Visit  Medication Sig Dispense Refill  . acetaminophen (TYLENOL) 500 MG tablet Take 500 mg by mouth every 6 (six) hours as needed for headache.    . lisinopril-hydrochlorothiazide (ZESTORETIC) 20-12.5 MG tablet Take 1 tablet by mouth daily. 30 tablet 2  . butalbital-acetaminophen-caffeine (ESGIC) 50-325-40 MG per tablet Take 1 tablet by mouth every 6 (six) hours as needed for headache. (Patient not taking: Reported on 10/23/2016) 30 tablet 0  . methocarbamol (ROBAXIN) 500 MG tablet Take 1 tablet (500 mg total) by mouth 2 (two) times daily as needed for muscle spasms. (Patient not taking: Reported on 10/23/2016) 20 tablet 0  . naproxen (NAPROSYN) 500 MG tablet Take 1 tablet (500 mg total) by mouth 2 (two) times daily with a meal. (Patient not taking: Reported on 10/23/2016) 30 tablet 0   No facility-administered medications prior to visit.     ROS Review of Systems  Constitutional: Negative.   HENT: Positive for rhinorrhea.   Eyes: Negative.   Respiratory: Positive for cough.   Cardiovascular: Negative.    Objective:  BP 128/76 (BP Location: Left Arm, Patient Position: Sitting, Cuff Size: Normal)   Pulse 81   Temp 98.1 F (36.7 C) (Oral)   Resp 18   Ht 5' (1.524 m)   Wt 130 lb 12.8 oz (59.3 kg)   LMP 06/04/2011   SpO2 98%   BMI 25.55 kg/m   BP/Weight 10/28/2016 10/23/2016 09/12/2016  Systolic BP 128 150 146  Diastolic BP 76 94 106  Wt. (Lbs) 130.8 131 -  BMI 25.55 26.46 -      Physical Exam  HENT:  Right Ear: External ear normal.  Left Ear: External ear normal.  Nose: Mucosal edema present. Right sinus exhibits no frontal sinus tenderness. Left sinus exhibits no frontal sinus tenderness.  Mouth/Throat: Oropharynx is clear and moist.  Eyes: Conjunctivae are normal. Pupils are equal, round, and reactive to light. Right eye exhibits no discharge. Left eye exhibits no discharge.  Cardiovascular: Normal rate, regular rhythm, normal heart sounds and intact distal pulses.   Pulmonary/Chest: Effort normal and breath sounds normal.   Assessment & Plan:   Problem List Items Addressed This Visit    None    Visit Diagnoses    Common cold    -  Primary   Relevant Medications   fluticasone (FLONASE) 50 MCG/ACT nasal spray   guaiFENesin-dextromethorphan (ROBITUSSIN DM) 100-10 MG/5ML syrup     Meds ordered this encounter  Medications  . fluticasone (FLONASE) 50 MCG/ACT nasal spray    Sig: Place 2 sprays into both nostrils daily. For 10 days.    Dispense:  16 g    Refill:  6    Order Specific Question:   Supervising Provider    Answer:   Quentin Angst [id number]  . guaiFENesin-dextromethorphan (ROBITUSSIN DM) 100-10 MG/5ML syrup    Sig: Take 5 mLs by mouth every 4 (four) hours as needed for cough.    Dispense:  236 mL    Refill:  1    Order Specific Question:   Supervising Provider    Answer:   Quentin AngstJEGEDE, OLUGBEMIGA E [id number]]    Follow-up: Return if symptoms worsen or fail to improve.   Lizbeth BarkMandesia R Undra Harriman FNP

## 2016-10-30 ENCOUNTER — Telehealth: Payer: Self-pay

## 2016-10-30 NOTE — Telephone Encounter (Signed)
-----   Message from Lizbeth BarkMandesia R Hairston, FNP sent at 10/30/2016  6:21 AM EST ----- -Labs that evaluated your kidney function, electrolytes, and fluid balance were normal. -Start eating a diet lower in saturated fat. Limit your intake of fried foods, red meats, and whole milk.  -Begin exercising at least 3-5 times per week for 30 minutes.

## 2016-10-30 NOTE — Telephone Encounter (Signed)
INTERPRETER ASHAM  ID [id number]  CMA CALL TO GO OVER LAB RESULTS  NO ANSWER BUT INTERPRETER LEFT A VM SAYING THE REASON OF THE CALL AND TO CALL US BACK

## 2016-11-10 ENCOUNTER — Ambulatory Visit: Payer: Self-pay | Attending: Family Medicine

## 2016-12-31 ENCOUNTER — Emergency Department (HOSPITAL_COMMUNITY)
Admission: EM | Admit: 2016-12-31 | Discharge: 2016-12-31 | Disposition: A | Discharge status: Home / Self Care | Payer: Self-pay | Attending: Emergency Medicine | Admitting: Emergency Medicine

## 2016-12-31 ENCOUNTER — Emergency Department (HOSPITAL_COMMUNITY): Payer: Self-pay

## 2016-12-31 ENCOUNTER — Encounter (HOSPITAL_COMMUNITY): Payer: Self-pay

## 2016-12-31 DIAGNOSIS — I10 Essential (primary) hypertension: Secondary | ICD-10-CM | POA: Insufficient documentation

## 2016-12-31 DIAGNOSIS — R102 Pelvic and perineal pain unspecified side: Secondary | ICD-10-CM

## 2016-12-31 DIAGNOSIS — M545 Low back pain, unspecified: Secondary | ICD-10-CM

## 2016-12-31 DIAGNOSIS — Y939 Activity, unspecified: Secondary | ICD-10-CM | POA: Insufficient documentation

## 2016-12-31 DIAGNOSIS — Y92007 Garden or yard of unspecified non-institutional (private) residence as the place of occurrence of the external cause: Secondary | ICD-10-CM | POA: Insufficient documentation

## 2016-12-31 DIAGNOSIS — W19XXXA Unspecified fall, initial encounter: Secondary | ICD-10-CM

## 2016-12-31 DIAGNOSIS — Y999 Unspecified external cause status: Secondary | ICD-10-CM | POA: Insufficient documentation

## 2016-12-31 DIAGNOSIS — W000XXA Fall on same level due to ice and snow, initial encounter: Secondary | ICD-10-CM | POA: Insufficient documentation

## 2016-12-31 MED ORDER — IBUPROFEN 400 MG PO TABS
600.0000 mg | ORAL_TABLET | Freq: Once | ORAL | Status: AC
  Administered 2016-12-31: 09:00:00 600 mg via ORAL
  Filled 2016-12-31: qty 1

## 2016-12-31 MED ORDER — IBUPROFEN 600 MG PO TABS: 600.0000 mg | ORAL_TABLET | Freq: Three times a day (TID) | ORAL | 0 refills | Status: DC | PRN

## 2016-12-31 MED ORDER — OXYCODONE-ACETAMINOPHEN 5-325 MG PO TABS
1.0000 | ORAL_TABLET | Freq: Once | ORAL | Status: AC
  Administered 2016-12-31: 1 via ORAL
  Filled 2016-12-31: qty 1

## 2016-12-31 NOTE — ED Notes (Signed)
ED Provider at bedside. 

## 2016-12-31 NOTE — ED Triage Notes (Signed)
Patient complains of lower back and coccyx pain after slipping early am on ice and falling backwards. States no loc, denies hitting head. Pain when she sits and with movement, has not taken otc meds. NAD

## 2016-12-31 NOTE — ED Notes (Signed)
Patient transported to X-ray 

## 2016-12-31 NOTE — ED Provider Notes (Signed)
[facility]-EMERGENCY DEPT Provider Note   CSN: [id number] Arrival date & time: 12/31/16  4098     History   Chief Complaint No chief complaint on file.   HPI Crystal Whitaker is a 51 y.o. female.  HPI Patient presents emergency department complaining of low back and sacral pain after slipping this morning on ice in her front yard.  No head injury.  No loss conscious.  Her pain is moderate in severity.  She denies weakness of her arms or legs.  No abdominal pain or chest pain.  She has not tried any medication prior to arrival.   Past Medical History:  Diagnosis Date  . Back pain   . Hypertension   . Sinusitis     Patient Active Problem List   Diagnosis Date Noted  . Headache(784.0) 02/22/2014  . Knee pain, acute 04/21/2013  . HYPERTENSION, BENIGN ESSENTIAL 12/15/2008  . DEGENERATIVE JOINT DISEASE, SHOULDER 12/15/2008  . LOW BACK PAIN SYNDROME 08/02/2007    Past Surgical History:  Procedure Laterality Date  . CESAREAN SECTION      OB History    No data available       Home Medications    Prior to Admission medications   Medication Sig Start Date End Date Taking? Authorizing Provider  butalbital-acetaminophen-caffeine (ESGIC) 50-325-40 MG per tablet Take 1 tablet by mouth every 6 (six) hours as needed for headache. Patient not taking: Reported on 10/23/2016 10/23/14   Doris Cheadle, MD  ibuprofen (ADVIL,MOTRIN) 600 MG tablet Take 1 tablet (600 mg total) by mouth every 8 (eight) hours as needed. 12/31/16   Azalia Bilis, MD  lisinopril-hydrochlorothiazide (ZESTORETIC) 20-12.5 MG tablet Take 1 tablet by mouth daily. 10/23/16   Lizbeth Bark, FNP    Family History Family History  Problem Relation Age of Onset  . Family history unknown: Yes    Social History Social History  Substance Use Topics  . Smoking status: Never Smoker  . Smokeless tobacco: Never Used  . Alcohol use No     Allergies   Patient has no known allergies.   Review of Systems Review of  Systems  All other systems reviewed and are negative.    Physical Exam Updated Vital Signs BP 134/68   Pulse 67   Temp 98.6 F (37 C) (Oral)   Resp 18   Ht 5' (1.524 m)   Wt 120 lb (54.4 kg)   LMP 06/04/2011   SpO2 95%   BMI 23.44 kg/m   Physical Exam  Constitutional: She is oriented to person, place, and time. She appears well-developed and well-nourished.  HENT:  Head: Normocephalic.  Eyes: EOM are normal.  Neck: Normal range of motion. Neck supple.  Pulmonary/Chest: Effort normal.  Abdominal: She exhibits no distension. There is no tenderness.  Musculoskeletal:  No thoracic tenderness.  Mild lumbar and paralumbar tenderness without obvious lumbar step-offs.  Full range of motion bilateral hips, knees, ankles.  Mild tenderness over the right SI joint without pelvic instability  Neurological: She is alert and oriented to person, place, and time.  Psychiatric: She has a normal mood and affect.  Nursing note and vitals reviewed.    ED Treatments / Results  Labs (all labs ordered are listed, but only abnormal results are displayed) Labs Reviewed - No data to display  EKG  EKG Interpretation None       Radiology Dg Lumbar Spine Complete  Result Date: 12/31/2016 CLINICAL DATA:  Recent fall with low back pain, initial encounter EXAM: LUMBAR SPINE -  COMPLETE 4+ VIEW COMPARISON:  None. FINDINGS: Mild hypertrophic facet changes are noted. No vertebral body compression deformity is seen. Mild anterolisthesis is noted at the lumbosacral junction slightly increased from a prior exam of 2014. No soft tissue abnormality is seen. IMPRESSION: Mild degenerative change without acute abnormality. Electronically Signed   By: Alcide CleverMark  Lukens M.D.   On: 12/31/2016 09:48   Dg Pelvis 1-2 Views  Result Date: 12/31/2016 CLINICAL DATA:  Recent fall with pain, initial encounter EXAM: PELVIS - 1-2 VIEW COMPARISON:  None. FINDINGS: Pelvic ring is intact. No acute fracture or dislocation is  noted. No soft tissue changes are seen. IMPRESSION: No acute abnormality noted. Electronically Signed   By: Alcide CleverMark  Lukens M.D.   On: 12/31/2016 09:49    Procedures Procedures (including critical care time)  Medications Ordered in ED Medications  ibuprofen (ADVIL,MOTRIN) tablet 600 mg (600 mg Oral Given 12/31/16 0915)  oxyCODONE-acetaminophen (PERCOCET/ROXICET) 5-325 MG per tablet 1 tablet (1 tablet Oral Given 12/31/16 0915)     Initial Impression / Assessment and Plan / ED Course  I have reviewed the triage vital signs and the nursing notes.  Pertinent labs & imaging results that were available during my care of the patient were reviewed by me and considered in my medical decision making (see chart for details).     No fractures noted on plain films.  Likely contusion.  Discharge home with anti-inflammatories.  Final Clinical Impressions(s) / ED Diagnoses   Final diagnoses:  Fall, initial encounter  Acute midline low back pain without sciatica  Acute pelvic pain    New Prescriptions Discharge Medication List as of 12/31/2016 10:45 AM    START taking these medications   Details  ibuprofen (ADVIL,MOTRIN) 600 MG tablet Take 1 tablet (600 mg total) by mouth every 8 (eight) hours as needed., Starting Wed 12/31/2016, Print         Azalia BilisKevin Cheryl Stabenow, MD 12/31/16 1719

## 2017-01-09 ENCOUNTER — Ambulatory Visit: Payer: Self-pay | Admitting: Family Medicine

## 2017-01-12 ENCOUNTER — Ambulatory Visit: Payer: Self-pay | Attending: Family Medicine | Admitting: Family Medicine

## 2017-01-12 VITALS — BP 142/82 | HR 67 | Temp 97.5°F | Resp 18 | Ht 60.0 in | Wt 132.0 lb

## 2017-01-12 DIAGNOSIS — K0889 Other specified disorders of teeth and supporting structures: Secondary | ICD-10-CM | POA: Insufficient documentation

## 2017-01-12 DIAGNOSIS — I10 Essential (primary) hypertension: Secondary | ICD-10-CM | POA: Insufficient documentation

## 2017-01-12 DIAGNOSIS — R633 Feeding difficulties: Secondary | ICD-10-CM

## 2017-01-12 DIAGNOSIS — M533 Sacrococcygeal disorders, not elsewhere classified: Secondary | ICD-10-CM | POA: Insufficient documentation

## 2017-01-12 DIAGNOSIS — W000XXA Fall on same level due to ice and snow, initial encounter: Secondary | ICD-10-CM | POA: Insufficient documentation

## 2017-01-12 DIAGNOSIS — M62830 Muscle spasm of back: Secondary | ICD-10-CM | POA: Insufficient documentation

## 2017-01-12 MED ORDER — CYCLOBENZAPRINE HCL 5 MG PO TABS: 5.0000 mg | ORAL_TABLET | Freq: Two times a day (BID) | ORAL | 0 refills | Status: DC | PRN

## 2017-01-12 MED ORDER — ACETAMINOPHEN 500 MG PO TABS: 1000.0000 mg | ORAL_TABLET | Freq: Four times a day (QID) | ORAL | 0 refills | Status: AC | PRN

## 2017-01-12 MED ORDER — BENZOCAINE 10 % MT GEL: 1.0000 "application " | OROMUCOSAL | 0 refills | Status: DC | PRN

## 2017-01-12 MED ORDER — HYDROCHLOROTHIAZIDE 25 MG PO TABS: 25.0000 mg | ORAL_TABLET | Freq: Every day | ORAL | 2 refills | Status: DC

## 2017-01-12 MED ORDER — LISINOPRIL 20 MG PO TABS: 20.0000 mg | ORAL_TABLET | Freq: Every day | ORAL | 2 refills | Status: DC

## 2017-01-12 NOTE — Progress Notes (Signed)
Patient is here for f/up HTN  Patient stated that she fell  2 weeks ago in the snow & she went to  the ED , they gave her ibuprofen for the back pain but stated that it still hurts  Patient stated that she has headaches

## 2017-01-12 NOTE — Progress Notes (Signed)
Subjective:  Patient ID: Crystal Whitaker, female    DOB: 12/04/1965  Age: 51 y.o. MRN: [id number]  CC: Hypertension   HPI Garrett Sundquist presents for   HTN: Reports adherence with BP medications.Denies any CP, SOB, swelling of the bilateral lower extremities, or dizziness.   Recent history of fall: 2 week ago fell on her lower back and buttocks. Occurred when slipping on ice in her front yard. Recent ED visit. No fractures. Anti-inflammatories given. Pain 8/10. Denies any paresthesias, skin bruising,  bowel or bladder incontinence. Pain worsens with bending fowarding and moving from a sitting to standing position. Reports not taking ibuprofen prn due to drowsiness.    Dental problem: Began 1 month ago. Loose tooth on left lower molar. Reports difficulty chewing and pain. Denies any faical swelling, drainage, or fevers.    Outpatient Medications Prior to Visit  Medication Sig Dispense Refill  . ibuprofen (ADVIL,MOTRIN) 600 MG tablet Take 1 tablet (600 mg total) by mouth every 8 (eight) hours as needed. 15 tablet 0  . lisinopril-hydrochlorothiazide (ZESTORETIC) 20-12.5 MG tablet Take 1 tablet by mouth daily. 30 tablet 2  . butalbital-acetaminophen-caffeine (ESGIC) 50-325-40 MG per tablet Take 1 tablet by mouth every 6 (six) hours as needed for headache. (Patient not taking: Reported on 10/23/2016) 30 tablet 0   No facility-administered medications prior to visit.     ROS Review of Systems  Respiratory: Negative.   Cardiovascular: Negative.   Gastrointestinal: Negative.   Musculoskeletal: Positive for back pain and myalgias.       Spasm.  Skin: Negative.   Neurological: Negative.      Objective:  BP (!) 142/82 (BP Location: Left Arm, Patient Position: Sitting, Cuff Size: Normal)   Pulse 67   Temp 97.5 F (36.4 C) (Oral)   Resp 18   Ht 5' (1.524 m)   Wt 132 lb (59.9 kg)   LMP 06/04/2011   SpO2 98%   BMI 25.78 kg/m   BP/Weight 01/12/2017 12/31/2016 10/28/2016  Systolic BP  142 409134 128  Diastolic BP 82 68 76  Wt. (Lbs) 132 120 130.8  BMI 25.78 23.44 25.55    Physical Exam  HENT:  Right Ear: External ear normal.  Left Ear: External ear normal.  Nose: Nose normal.  Mouth/Throat: Oropharynx is clear and moist. Abnormal dentition.  Eyes: Conjunctivae are normal. Pupils are equal, round, and reactive to light.  Neck: No JVD present.  Cardiovascular: Normal rate, regular rhythm, normal heart sounds and intact distal pulses.   Pulmonary/Chest: Effort normal and breath sounds normal.  Abdominal: Soft. Bowel sounds are normal.  Musculoskeletal:       Lumbar back: She exhibits pain (sacral pain, inc. with bending and going from sitting to standing position. ).  Skin: Skin is warm and dry.  Psychiatric: She has a normal mood and affect.  Nursing note and vitals reviewed.   Assessment & Plan:   Problem List Items Addressed This Visit    None    Visit Diagnoses    Essential hypertension    -  Primary   Schedule BP recheck in 2 weeks with nurse.   If BP is greater than 90/60 (MAP 65 or greater) but not less than 130/80 may increase dose    of lisinopril to 30 mg QD and recheck in another 2 weeks with RN.   Relevant Medications   lisinopril (PRINIVIL,ZESTRIL) 20 MG tablet   hydrochlorothiazide (HYDRODIURIL) 25 MG tablet   Sacral back pain  Relevant Medications   cyclobenzaprine (FLEXERIL) 5 MG tablet   acetaminophen (TYLENOL) 500 MG tablet   Muscle spasm of back       Relevant Medications   cyclobenzaprine (FLEXERIL) 5 MG tablet   acetaminophen (TYLENOL) 500 MG tablet   Tooth loose       Relevant Orders   Ambulatory referral to Dentistry   Difficulty chewing       Relevant Orders   Ambulatory referral to Dentistry   Pain, dental       Relevant Medications   acetaminophen (TYLENOL) 500 MG tablet   benzocaine (ORAJEL) 10 % mucosal gel   Other Relevant Orders   Ambulatory referral to Dentistry     Meds ordered this encounter  Medications    . cyclobenzaprine (FLEXERIL) 5 MG tablet    Sig: Take 1 tablet (5 mg total) by mouth 2 (two) times daily as needed for muscle spasms.    Dispense:  20 tablet    Refill:  0    Order Specific Question:   Supervising Provider    Answer:   Quentin Angst [id number]  . acetaminophen (TYLENOL) 500 MG tablet    Sig: Take 2 tablets (1,000 mg total) by mouth every 6 (six) hours as needed for moderate pain.    Dispense:  30 tablet    Refill:  0    Order Specific Question:   Supervising Provider    Answer:   Quentin Angst [id number]  . lisinopril (PRINIVIL,ZESTRIL) 20 MG tablet    Sig: Take 1 tablet (20 mg total) by mouth daily.    Dispense:  30 tablet    Refill:  2    Order Specific Question:   Supervising Provider    Answer:   Quentin Angst [id number]  . hydrochlorothiazide (HYDRODIURIL) 25 MG tablet    Sig: Take 1 tablet (25 mg total) by mouth daily.    Dispense:  30 tablet    Refill:  2    Order Specific Question:   Supervising Provider    Answer:   Quentin Angst [id number]  . benzocaine (ORAJEL) 10 % mucosal gel    Sig: Use as directed 1 application in the mouth or throat as needed for mouth pain.    Dispense:  5.3 g    Refill:  0    Order Specific Question:   Supervising Provider    Answer:   Quentin Angst [id number]    Follow-up: Return  if symptoms worsen or fail to improve. Return in about 2 weeks (around 01/26/2017) for BP check with RN .   Lizbeth Bark FNP

## 2017-01-12 NOTE — Patient Instructions (Addendum)
Schedule BP check with nurse in 2 weeks.   Musculoskeletal Pain Musculoskeletal pain is muscle and bone aches and pains. This pain can occur in any part of the body. Follow these instructions at home:  Only take medicines for pain, discomfort, or fever as told by your health care provider.  You may continue all activities unless the activities cause more pain. When the pain lessens, slowly resume normal activities. Gradually increase the intensity and duration of the activities or exercise.  During periods of severe pain, bed rest may be helpful. Lie or sit in any position that is comfortable, but get out of bed and walk around at least every several hours.  If directed, put ice on the injured area.  Put ice in a plastic bag.  Place a towel between your skin and the bag.  Leave the ice on for 20 minutes, 2-3 times a day. Contact a health care provider if:  Your pain is getting worse.  Your pain is not relieved with medicines.  You lose function in the area of the pain if the pain is in your arms, legs, or neck. This information is not intended to replace advice given to you by your health care provider. Make sure you discuss any questions you have with your health care provider. Document Released: 10/06/2005 Document Revised: 03/18/2016 Document Reviewed: 06/10/2013 Elsevier Interactive Patient Education  2017 [employer]  Back Pain, Adult Back pain is very common. The pain often gets better over time. The cause of back pain is usually not dangerous. Most people can learn to manage their back pain on their own. Follow these instructions at home: Watch your back pain for any changes. The following actions may help to lessen any pain you are feeling:  Stay active. Start with short walks on flat ground if you can. Try to walk farther each day.  Exercise regularly as told by your doctor. Exercise helps your back heal faster. It also helps avoid future injury by keeping your  muscles strong and flexible.  Do not sit, drive, or stand in one place for more than 30 minutes.  Do not stay in bed. Resting more than 1-2 days can slow down your recovery.  Be careful when you bend or lift an object. Use good form when lifting:  Bend at your knees.  Keep the object close to your body.  Do not twist.  Sleep on a firm mattress. Lie on your side, and bend your knees. If you lie on your back, put a pillow under your knees.  Take medicines only as told by your doctor.  Put ice on the injured area.  Put ice in a plastic bag.  Place a towel between your skin and the bag.  Leave the ice on for 20 minutes, 2-3 times a day for the first 2-3 days. After that, you can switch between ice and heat packs.  Avoid feeling anxious or stressed. Find good ways to deal with stress, such as exercise.  Maintain a healthy weight. Extra weight puts stress on your back. Contact a doctor if:  You have pain that does not go away with rest or medicine.  You have worsening pain that goes down into your legs or buttocks.  You have pain that does not get better in one week.  You have pain at night.  You lose weight.  You have a fever or chills. Get help right away if:  You cannot control when you poop (bowel movement) or pee (  urinate).  Your arms or legs feel weak.  Your arms or legs lose feeling (numbness).  You feel sick to your stomach (nauseous) or throw up (vomit).  You have belly (abdominal) pain.  You feel like you may pass out (faint). This information is not intended to replace advice given to you by your health care provider. Make sure you discuss any questions you have with your health care provider. Document Released: 03/24/2008 Document Revised: 03/13/2016 Document Reviewed: 02/07/2014 Elsevier Interactive Patient Education  2017 [employer].

## 2017-02-09 ENCOUNTER — Ambulatory Visit: Payer: Self-pay | Attending: Family Medicine

## 2017-05-07 ENCOUNTER — Ambulatory Visit: Payer: Self-pay | Attending: Family Medicine | Admitting: Family Medicine

## 2017-05-07 VITALS — BP 130/76 | HR 72 | Temp 98.1°F | Resp 18 | Ht 59.0 in | Wt 128.0 lb

## 2017-05-07 DIAGNOSIS — Z1239 Encounter for other screening for malignant neoplasm of breast: Secondary | ICD-10-CM

## 2017-05-07 DIAGNOSIS — Z1212 Encounter for screening for malignant neoplasm of rectum: Secondary | ICD-10-CM

## 2017-05-07 DIAGNOSIS — G43009 Migraine without aura, not intractable, without status migrainosus: Secondary | ICD-10-CM

## 2017-05-07 DIAGNOSIS — Z1211 Encounter for screening for malignant neoplasm of colon: Secondary | ICD-10-CM

## 2017-05-07 DIAGNOSIS — I10 Essential (primary) hypertension: Secondary | ICD-10-CM

## 2017-05-07 DIAGNOSIS — Z1231 Encounter for screening mammogram for malignant neoplasm of breast: Secondary | ICD-10-CM

## 2017-05-07 MED ORDER — BUTALBITAL-ASA-CAFFEINE 50-325-40 MG PO CAPS: 1.0000 | ORAL_CAPSULE | Freq: Two times a day (BID) | ORAL | 0 refills | Status: DC | PRN

## 2017-05-07 MED FILL — BUTALB-ACETAMIN-CAFF 50-325: 50-325-40 | 30 days supply | Qty: 60 | Fill #0

## 2017-05-07 NOTE — Progress Notes (Signed)
Subjective:  Patient ID: Crystal Whitaker, female    DOB: 12-08-1965  Age: 51 y.o. MRN: [id number]  CC: Headache   HPI Crystal Whitaker presents for complaints of headaches.Symptoms began about 3 days ago. Generally, the headaches occur intermittently. The headache do not seem to be related to any time of the day. The headaches are usually dull and are frontal in location.  The patient rates her most severe headaches a 8 on a scale from 1 to 10. Recently, the headaches are stable. Precipitating factors include none which have been determined. The headaches are usually not preceded by an aura. The patient denies dizziness, loss of balance, numbness of extremities, difficulty walking, speech difficulties, vision problems and vomiting in the early morning. Other associated symptoms include: right sided eye redness. She reports improvement of symptoms a day ago. Home treatment has included acetaminophen and ibuprofen with inadequate improvement. Other history includes: headches diagnosed in the past. Family history includes no known family members with significant headaches. History of hypertension. She reports adherence with antihypertensive medications. She denies any cardiac symptoms.    Outpatient Medications Prior to Visit  Medication Sig Dispense Refill  . acetaminophen (TYLENOL) 500 MG tablet Take 2 tablets (1,000 mg total) by mouth every 6 (six) hours as needed for moderate pain. 30 tablet 0  . benzocaine (ORAJEL) 10 % mucosal gel Use as directed 1 application in the mouth or throat as needed for mouth pain. 5.3 g 0  . cyclobenzaprine (FLEXERIL) 5 MG tablet Take 1 tablet (5 mg total) by mouth 2 (two) times daily as needed for muscle spasms. 20 tablet 0  . hydrochlorothiazide (HYDRODIURIL) 25 MG tablet Take 1 tablet (25 mg total) by mouth daily. 30 tablet 2  . ibuprofen (ADVIL,MOTRIN) 600 MG tablet Take 1 tablet (600 mg total) by mouth every 8 (eight) hours as needed. 15 tablet 0  . lisinopril  (PRINIVIL,ZESTRIL) 20 MG tablet Take 1 tablet (20 mg total) by mouth daily. 30 tablet 2  . butalbital-acetaminophen-caffeine (ESGIC) 50-325-40 MG per tablet Take 1 tablet by mouth every 6 (six) hours as needed for headache. (Patient not taking: Reported on 10/23/2016) 30 tablet 0   No facility-administered medications prior to visit.     ROS Review of Systems  Constitutional: Negative.   HENT: Negative.   Eyes: Negative.   Respiratory: Negative.   Cardiovascular: Negative.   Skin: Negative.   Neurological: Positive for headaches.  Psychiatric/Behavioral: Negative.    Objective:  BP 130/76 (BP Location: Right Arm, Patient Position: Sitting, Cuff Size: Normal)   Pulse 72   Temp 98.1 F (36.7 C) (Oral)   Resp 18   Ht 4\' 11"  (1.499 m)   Wt 128 lb (58.1 kg)   LMP 06/04/2011   SpO2 99%   BMI 25.85 kg/m   BP/Weight 05/07/2017 01/12/2017 12/31/2016  Systolic BP 130 142 134  Diastolic BP 76 82 68  Wt. (Lbs) 128 132 120  BMI 25.85 25.78 23.44   Physical Exam  Constitutional: She is oriented to person, place, and time. She appears well-developed and well-nourished.  HENT:  Head: Normocephalic and atraumatic.  Right Ear: External ear normal.  Left Ear: External ear normal.  Nose: Nose normal.  Mouth/Throat: Oropharynx is clear and moist.  Eyes: Pupils are equal, round, and reactive to light. Conjunctivae are normal.  Neck: Normal range of motion. Neck supple.  Cardiovascular: Normal rate, regular rhythm, normal heart sounds and intact distal pulses.   Pulmonary/Chest: Effort normal and breath sounds normal.  Lymphadenopathy:    She has no cervical adenopathy.  Neurological: She is alert and oriented to person, place, and time.  Skin: Skin is warm and dry.  Psychiatric: She has a normal mood and affect.  Nursing note and vitals reviewed.   Assessment & Plan:   Problem List Items Addressed This Visit    None    Visit Diagnoses    Migraine without aura and without status  migrainosus, not intractable    -  Primary   Relevant Medications   butalbital-aspirin-caffeine (FIORINAL) 50-325-40 MG capsule   Essential hypertension       Screening for breast cancer       Relevant Orders   MM SCREENING BREAST TOMO BILATERAL   Screening for colorectal cancer       Relevant Orders   Ambulatory referral to Gastroenterology      Meds ordered this encounter  Medications  . DISCONTD: butalbital-aspirin-caffeine (FIORINAL) 50-325-40 MG capsule    Sig: Take 1 capsule by mouth 2 (two) times daily as needed for headache.    Dispense:  60 capsule    Refill:  0    Order Specific Question:   Supervising Provider    Answer:   Quentin AngstJEGEDE, OLUGBEMIGA E [id number]  . butalbital-aspirin-caffeine (FIORINAL) 50-325-40 MG capsule    Sig: Take 1 capsule by mouth 2 (two) times daily as needed for headache.    Dispense:  60 capsule    Refill:  0    Order Specific Question:   Supervising Provider    Answer:   Quentin AngstJEGEDE, OLUGBEMIGA E [[id number]]    Follow-up: Return in about 3 months (around 08/07/2017), or if symptoms worsen or fail to improve, for HTN.   Crystal BarkMandesia R Hairston FNP

## 2017-05-07 NOTE — Patient Instructions (Signed)
Migraine Headache A migraine headache is an intense, throbbing pain on one side or both sides of the head. Migraines may also cause other symptoms, such as nausea, vomiting, and sensitivity to light and noise. What are the causes? Doing or taking certain things may also trigger migraines, such as:  Alcohol.  Smoking.  Medicines, such as: ? Medicine used to treat chest pain (nitroglycerine). ? Birth control pills. ? Estrogen pills. ? Certain blood pressure medicines.  Aged cheeses, chocolate, or caffeine.  Foods or drinks that contain nitrates, glutamate, aspartame, or tyramine.  Physical activity.  Other things that may trigger a migraine include:  Menstruation.  Pregnancy.  Hunger.  Stress, lack of sleep, too much sleep, or fatigue.  Weather changes.  What increases the risk? The following factors may make you more likely to experience migraine headaches:  Age. Risk increases with age.  Family history of migraine headaches.  Being Caucasian.  Depression and anxiety.  Obesity.  Being a woman.  Having a hole in the heart (patent foramen ovale) or other heart problems.  What are the signs or symptoms? The main symptom of this condition is pulsating or throbbing pain. Pain may:  Happen in any area of the head, such as on one side or both sides.  Interfere with daily activities.  Get worse with physical activity.  Get worse with exposure to bright lights or loud noises.  Other symptoms may include:  Nausea.  Vomiting.  Dizziness.  General sensitivity to bright lights, loud noises, or smells.  Before you get a migraine, you may get warning signs that a migraine is developing (aura). An aura may include:  Seeing flashing lights or having blind spots.  Seeing bright spots, halos, or zigzag lines.  Having tunnel vision or blurred vision.  Having numbness or a tingling feeling.  Having trouble talking.  Having muscle weakness.  How is this  diagnosed? A migraine headache can be diagnosed based on:  Your symptoms.  A physical exam.  Tests, such as CT scan or MRI of the head. These imaging tests can help rule out other causes of headaches.  Taking fluid from the spine (lumbar puncture) and analyzing it (cerebrospinal fluid analysis, or CSF analysis).  How is this treated? A migraine headache is usually treated with medicines that:  Relieve pain.  Relieve nausea.  Prevent migraines from coming back.  Treatment may also include:  Acupuncture.  Lifestyle changes like avoiding foods that trigger migraines.  Follow these instructions at home: Medicines  Take over-the-counter and prescription medicines only as told by your health care provider.  Do not drive or use heavy machinery while taking prescription pain medicine.  To prevent or treat constipation while you are taking prescription pain medicine, your health care provider may recommend that you: ? Drink enough fluid to keep your urine clear or pale yellow. ? Take over-the-counter or prescription medicines. ? Eat foods that are high in fiber, such as fresh fruits and vegetables, whole grains, and beans. ? Limit foods that are high in fat and processed sugars, such as fried and sweet foods. Lifestyle  Avoid alcohol use.  Do not use any products that contain nicotine or tobacco, such as cigarettes and e-cigarettes. If you need help quitting, ask your health care provider.  Get at least 8 hours of sleep every night.  Limit your stress. General instructions   Keep a journal to find out what may trigger your migraine headaches. For example, write down: ? What you eat and   drink. ? How much sleep you get. ? Any change to your diet or medicines.  If you have a migraine: ? Avoid things that make your symptoms worse, such as bright lights. ? It may help to lie down in a dark, quiet room. ? Do not drive or use heavy machinery. ? Ask your health care provider  what activities are safe for you while you are experiencing symptoms.  Keep all follow-up visits as told by your health care provider. This is important. Contact a health care provider if:  You develop symptoms that are different or more severe than your usual migraine symptoms. Get help right away if:  Your migraine becomes severe.  You have a fever.  You have a stiff neck.  You have vision loss.  Your muscles feel weak or like you cannot control them.  You start to lose your balance often.  You develop trouble walking.  You faint. This information is not intended to replace advice given to you by your health care provider. Make sure you discuss any questions you have with your health care provider. Document Released: 10/06/2005 Document Revised: 04/25/2016 Document Reviewed: 03/24/2016 Elsevier Interactive Patient Education  2017 [employer] Inc[employer]   

## 2017-07-20 ENCOUNTER — Ambulatory Visit: Payer: Self-pay | Attending: Family Medicine

## 2017-09-09 ENCOUNTER — Ambulatory Visit: Payer: Self-pay | Attending: Family Medicine | Admitting: Family Medicine

## 2017-09-09 ENCOUNTER — Encounter: Payer: Self-pay | Admitting: Family Medicine

## 2017-09-09 VITALS — BP 153/91 | HR 80 | Temp 98.6°F | Resp 18 | Ht 59.0 in | Wt 131.0 lb

## 2017-09-09 DIAGNOSIS — Z791 Long term (current) use of non-steroidal anti-inflammatories (NSAID): Secondary | ICD-10-CM | POA: Insufficient documentation

## 2017-09-09 DIAGNOSIS — I10 Essential (primary) hypertension: Secondary | ICD-10-CM | POA: Insufficient documentation

## 2017-09-09 DIAGNOSIS — M1990 Unspecified osteoarthritis, unspecified site: Secondary | ICD-10-CM | POA: Insufficient documentation

## 2017-09-09 DIAGNOSIS — Z7982 Long term (current) use of aspirin: Secondary | ICD-10-CM | POA: Insufficient documentation

## 2017-09-09 DIAGNOSIS — Z79899 Other long term (current) drug therapy: Secondary | ICD-10-CM | POA: Insufficient documentation

## 2017-09-09 DIAGNOSIS — Z1231 Encounter for screening mammogram for malignant neoplasm of breast: Secondary | ICD-10-CM

## 2017-09-09 DIAGNOSIS — Z1239 Encounter for other screening for malignant neoplasm of breast: Secondary | ICD-10-CM

## 2017-09-09 DIAGNOSIS — M25562 Pain in left knee: Secondary | ICD-10-CM | POA: Insufficient documentation

## 2017-09-09 DIAGNOSIS — Z1211 Encounter for screening for malignant neoplasm of colon: Secondary | ICD-10-CM

## 2017-09-09 MED ORDER — LISINOPRIL 20 MG PO TABS: 20.0000 mg | ORAL_TABLET | Freq: Every day | ORAL | 1 refills | Status: DC

## 2017-09-09 MED ORDER — NAPROXEN 500 MG PO TABS
ORAL_TABLET | ORAL | 0 refills | Status: DC
Start: 2017-09-09 — End: 2018-11-30

## 2017-09-09 MED ORDER — HYDROCHLOROTHIAZIDE 25 MG PO TABS: 25.0000 mg | ORAL_TABLET | Freq: Every day | ORAL | 1 refills | Status: DC

## 2017-09-09 MED FILL — HYDROCHLOROTHIAZIDE 25 MG T: 25 | 30 days supply | Qty: 30 | Fill #0

## 2017-09-09 MED FILL — NAPROXEN 500 MG TABLET: 500 | 20 days supply | Qty: 40 | Fill #0

## 2017-09-09 MED FILL — LISINOPRIL 20 MG TABLET: 20 | 30 days supply | Qty: 30 | Fill #0

## 2017-09-09 NOTE — Patient Instructions (Addendum)
Arthritis Arthritis means joint pain. It can also mean joint disease. A joint is a place where bones come together. People who have arthritis may have:  Red joints.  Swollen joints.  Stiff joints.  Warm joints.  A fever.  A feeling of being sick.  Follow these instructions at home: Pay attention to any changes in your symptoms. Take these actions to help with your pain and swelling. Medicines  Take over-the-counter and prescription medicines only as told by your doctor.  Do not take aspirin for pain if your doctor says that you may have gout. Activity  Rest your joint if your doctor tells you to.  Avoid activities that make the pain worse.  Exercise your joint regularly as told by your doctor. Try doing exercises like: ? Swimming. ? Water aerobics. ? Biking. ? Walking. Joint Care   If your joint is swollen, keep it raised (elevated) if told by your doctor.  If your joint feels stiff in the morning, try taking a warm shower.  If you have diabetes, do not apply heat without asking your doctor.  If told, apply heat to the joint: ? Put a towel between the joint and the hot pack or heating pad. ? Leave the heat on the area for 20-30 minutes.  If told, apply ice to the joint: ? Put ice in a plastic bag. ? Place a towel between your skin and the bag. ? Leave the ice on for 20 minutes, 2-3 times per day.  Keep all follow-up visits as told by your doctor. Contact a doctor if:  The pain gets worse.  You have a fever. Get help right away if:  You have very bad pain in your joint.  You have swelling in your joint.  Your joint is red.  Many joints become painful and swollen.  You have very bad back pain.  Your leg is very weak.  You cannot control your pee (urine) or poop (stool). This information is not intended to replace advice given to you by your health care provider. Make sure you discuss any questions you have with your health care provider. Document  Released: 12/31/2009 Document Revised: 03/13/2016 Document Reviewed: 01/01/2015 Elsevier Interactive Patient Education  2018 [employer]   Managing Your Hypertension Hypertension is commonly called high blood pressure. This is when the force of your blood pressing against the walls of your arteries is too strong. Arteries are blood vessels that carry blood from your heart throughout your body. Hypertension forces the heart to work harder to pump blood, and may cause the arteries to become narrow or stiff. Having untreated or uncontrolled hypertension can cause heart attack, stroke, kidney disease, and other problems. What are blood pressure readings? A blood pressure reading consists of a higher number over a lower number. Ideally, your blood pressure should be below 120/80. The first ("top") number is called the systolic pressure. It is a measure of the pressure in your arteries as your heart beats. The second ("bottom") number is called the diastolic pressure. It is a measure of the pressure in your arteries as the heart relaxes. What does my blood pressure reading mean? Blood pressure is classified into four stages. Based on your blood pressure reading, your health care provider may use the following stages to determine what type of treatment you need, if any. Systolic pressure and diastolic pressure are measured in a unit called mm Hg. Normal  Systolic pressure: below 120.  Diastolic pressure: below 80. Elevated  Systolic pressure:  120-129.  Diastolic pressure: below 80. Hypertension stage 1  Systolic pressure: 130-139.  Diastolic pressure: 80-89. Hypertension stage 2  Systolic pressure: 140 or above.  Diastolic pressure: 90 or above. What health risks are associated with hypertension? Managing your hypertension is an important responsibility. Uncontrolled hypertension can lead to:  A heart attack.  A stroke.  A weakened blood vessel (aneurysm).  Heart failure.  Kidney  damage.  Eye damage.  Metabolic syndrome.  Memory and concentration problems.  What changes can I make to manage my hypertension? Hypertension can be managed by making lifestyle changes and possibly by taking medicines. Your health care provider will help you make a plan to bring your blood pressure within a normal range. Eating and drinking  Eat a diet that is high in fiber and potassium, and low in salt (sodium), added sugar, and fat. An example eating plan is called the DASH (Dietary Approaches to Stop Hypertension) diet. To eat this way: ? Eat plenty of fresh fruits and vegetables. Try to fill half of your plate at each meal with fruits and vegetables. ? Eat whole grains, such as whole wheat pasta, brown rice, or whole grain bread. Fill about one quarter of your plate with whole grains. ? Eat low-fat diary products. ? Avoid fatty cuts of meat, processed or cured meats, and poultry with skin. Fill about one quarter of your plate with lean proteins such as fish, chicken without skin, beans, eggs, and tofu. ? Avoid premade and processed foods. These tend to be higher in sodium, added sugar, and fat.  Reduce your daily sodium intake. Most people with hypertension should eat less than 1,500 mg of sodium a day.  Limit alcohol intake to no more than 1 drink a day for nonpregnant women and 2 drinks a day for men. One drink equals 12 oz of beer, 5 oz of wine, or 1 oz of hard liquor. Lifestyle  Work with your health care provider to maintain a healthy body weight, or to lose weight. Ask what an ideal weight is for you.  Get at least 30 minutes of exercise that causes your heart to beat faster (aerobic exercise) most days of the week. Activities may include walking, swimming, or biking.  Include exercise to strengthen your muscles (resistance exercise), such as weight lifting, as part of your weekly exercise routine. Try to do these types of exercises for 30 minutes at least 3 days a  week.  Do not use any products that contain nicotine or tobacco, such as cigarettes and e-cigarettes. If you need help quitting, ask your health care provider.  Control any long-term (chronic) conditions you have, such as high cholesterol or diabetes. Monitoring  Monitor your blood pressure at home as told by your health care provider. Your personal target blood pressure may vary depending on your medical conditions, your age, and other factors.  Have your blood pressure checked regularly, as often as told by your health care provider. Working with your health care provider  Review all the medicines you take with your health care provider because there may be side effects or interactions.  Talk with your health care provider about your diet, exercise habits, and other lifestyle factors that may be contributing to hypertension.  Visit your health care provider regularly. Your health care provider can help you create and adjust your plan for managing hypertension. Will I need medicine to control my blood pressure? Your health care provider may prescribe medicine if lifestyle changes are not enough to  get your blood pressure under control, and if:  Your systolic blood pressure is 130 or higher.  Your diastolic blood pressure is 80 or higher.  Take medicines only as told by your health care provider. Follow the directions carefully. Blood pressure medicines must be taken as prescribed. The medicine does not work as well when you skip doses. Skipping doses also puts you at risk for problems. Contact a health care provider if:  You think you are having a reaction to medicines you have taken.  You have repeated (recurrent) headaches.  You feel dizzy.  You have swelling in your ankles.  You have trouble with your vision. Get help right away if:  You develop a severe headache or confusion.  You have unusual weakness or numbness, or you feel faint.  You have severe pain in your chest  or abdomen.  You vomit repeatedly.  You have trouble breathing. Summary  Hypertension is when the force of blood pumping through your arteries is too strong. If this condition is not controlled, it may put you at risk for serious complications.  Your personal target blood pressure may vary depending on your medical conditions, your age, and other factors. For most people, a normal blood pressure is less than 120/80.  Hypertension is managed by lifestyle changes, medicines, or both. Lifestyle changes include weight loss, eating a healthy, low-sodium diet, exercising more, and limiting alcohol. This information is not intended to replace advice given to you by your health care provider. Make sure you discuss any questions you have with your health care provider. Document Released: 06/30/2012 Document Revised: 09/03/2016 Document Reviewed: 09/03/2016 Elsevier Interactive Patient Education  [employer].

## 2017-09-09 NOTE — Progress Notes (Signed)
Patient is here for 3 month f/up   Patient complains left knee pain that comes & goes

## 2017-09-09 NOTE — Progress Notes (Signed)
Subjective:  Patient ID: Crystal Whitaker, female    DOB: 11/06/1965  Age: 51 y.o. MRN: [id number]  CC: Hypertension   HPI Crystal Whitaker presents for hypertension follow up. She does not check BP at home. She reports being without her antihypertensive medication for 2 weeks.Cardiac symptoms none. Patient denies chest pain, chest pressure/discomfort, claudication, dyspnea, lower extremity edema, near-syncope, palpitations and paroxysmal nocturnal dyspnea.  Cardiovascular risk factors: hypertension and sedentary lifestyle. Use of agents associated with hypertension: none. History of target organ damage: none. She complains of arthralgias for which has been present for 1 day. Pain is located in the left knee(s), is described as aching, and is constant . Associated symptoms include: none.  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 Aggravating factors: walking. The patient has tried nothing for pain relief.  Related to injury:   no.   Outpatient Medications Prior to Visit  Medication Sig Dispense Refill  . acetaminophen (TYLENOL) 500 MG tablet Take 2 tablets (1,000 mg total) by mouth every 6 (six) hours as needed for moderate pain. 30 tablet 0  . benzocaine (ORAJEL) 10 % mucosal gel Use as directed 1 application in the mouth or throat as needed for mouth pain. 5.3 g 0  . butalbital-aspirin-caffeine (FIORINAL) 50-325-40 MG capsule Take 1 capsule by mouth 2 (two) times daily as needed for headache. 60 capsule 0  . cyclobenzaprine (FLEXERIL) 5 MG tablet Take 1 tablet (5 mg total) by mouth 2 (two) times daily as needed for muscle spasms. 20 tablet 0  . butalbital-acetaminophen-caffeine (ESGIC) 50-325-40 MG per tablet Take 1 tablet by mouth every 6 (six) hours as needed for headache. (Patient not taking: Reported on 10/23/2016) 30 tablet 0  . hydrochlorothiazide (HYDRODIURIL) 25 MG tablet Take 1 tablet (25 mg total) by mouth daily. 30 tablet 2  . ibuprofen  (ADVIL,MOTRIN) 600 MG tablet Take 1 tablet (600 mg total) by mouth every 8 (eight) hours as needed. 15 tablet 0  . lisinopril (PRINIVIL,ZESTRIL) 20 MG tablet Take 1 tablet (20 mg total) by mouth daily. 30 tablet 2   No facility-administered medications prior to visit.     ROS Review of Systems  Constitutional: Negative.   Respiratory: Negative.   Cardiovascular: Negative.   Musculoskeletal: Positive for arthralgias.  Skin: Negative.     Objective:  BP (!) 153/91 (BP Location: Left Arm, Patient Position: Sitting, Cuff Size: Normal)   Pulse 80   Temp 98.6 F (37 C) (Oral)   Resp 18   Ht 4\' 11"  (1.499 m)   Wt 131 lb (59.4 kg)   LMP 06/04/2011   SpO2 98%   BMI 26.46 kg/m   BP/Weight 09/09/2017 05/07/2017 01/12/2017  Systolic BP 153 130 142  Diastolic BP 91 76 82  Wt. (Lbs) 131 128 132  BMI 26.46 25.85 25.78     Physical Exam  Constitutional: She appears well-developed and well-nourished.  Eyes: Pupils are equal, round, and reactive to light.  Neck: No JVD present.  Cardiovascular: Normal rate, regular rhythm, normal heart sounds and intact distal pulses.  Pulmonary/Chest: Effort normal and breath sounds normal.  Musculoskeletal:  Left knee pain extension. Crepitus bilateral knees.  Skin: Skin is warm and dry.  Nursing note and vitals reviewed.    Assessment & Plan:   1. Arthritis  - naproxen (NAPROSYN) 500 MG tablet; TAKE ONE TABLET BY MOUTH TWICE A DAY WITH MEALS FOR 5 DAYS. THEN TAKE ONE TABLET BY MOUTH DAILY WITH MEAL AS NEEDED  FOR PAIN.  Dispense: 40 tablet; Refill: 0  2. Essential hypertension  - hydrochlorothiazide (HYDRODIURIL) 25 MG tablet; Take 1 tablet (25 mg total) by mouth daily.  Dispense: 90 tablet; Refill: 1 - lisinopril (PRINIVIL,ZESTRIL) 20 MG tablet; Take 1 tablet (20 mg total) by mouth daily.  Dispense: 90 tablet; Refill: 1  3. Screen for colon cancer  - Fecal occult blood, imunochemical  4. Screening for breast cancer  - MM SCREENING  BREAST TOMO BILATERAL; Future     Follow-up: Return in about 3 months (around 12/10/2017) for HTN.   Lizbeth BarkMandesia R Denaja Verhoeven FNP

## 2017-09-16 LAB — FECAL OCCULT BLOOD, IMMUNOCHEMICAL: Fecal Occult Bld: NEGATIVE

## 2017-09-22 ENCOUNTER — Telehealth: Payer: Self-pay

## 2017-09-22 NOTE — Telephone Encounter (Signed)
CMA call regarding lab results    interpreter ID & name maded [phone]  Patient did not answer interpreter left a VM stating the reason of the call & to call back

## 2017-09-22 NOTE — Telephone Encounter (Signed)
-----  Message from Alfonse Spruce, [place] sent at 09/18/2017  1:10 PM EST ----- Colon cancer screening kit negative for blood. Recommend screening again in 1 year or colonoscopy.

## 2017-09-23 ENCOUNTER — Telehealth: Payer: Self-pay | Admitting: Family Medicine

## 2017-09-23 NOTE — Telephone Encounter (Signed)
Pt called back requesting results, please f/up

## 2017-10-09 ENCOUNTER — Other Ambulatory Visit: Payer: Self-pay

## 2017-10-09 ENCOUNTER — Telehealth: Payer: Self-pay | Admitting: *Deleted

## 2017-10-09 NOTE — Telephone Encounter (Signed)
Pt came to office to get results

## 2017-10-16 NOTE — Telephone Encounter (Signed)
Pt aware of results. She came to office to get results. Verified understanding.

## 2017-10-19 MED FILL — HYDROCHLOROTHIAZIDE 25 MG T: 25 | 30 days supply | Qty: 30 | Fill #1

## 2017-11-09 ENCOUNTER — Ambulatory Visit: Payer: Self-pay

## 2017-11-17 ENCOUNTER — Ambulatory Visit: Payer: Self-pay

## 2017-11-18 ENCOUNTER — Ambulatory Visit: Payer: Self-pay | Attending: Family Medicine

## 2018-04-15 ENCOUNTER — Ambulatory Visit: Payer: BLUE CROSS/BLUE SHIELD | Attending: Family Medicine | Admitting: Physician Assistant

## 2018-04-15 DIAGNOSIS — Z791 Long term (current) use of non-steroidal anti-inflammatories (NSAID): Secondary | ICD-10-CM | POA: Insufficient documentation

## 2018-04-15 DIAGNOSIS — Z79899 Other long term (current) drug therapy: Secondary | ICD-10-CM | POA: Diagnosis not present

## 2018-04-15 DIAGNOSIS — I10 Essential (primary) hypertension: Secondary | ICD-10-CM | POA: Insufficient documentation

## 2018-04-15 MED ORDER — LISINOPRIL 20 MG PO TABS: 20.0000 mg | ORAL_TABLET | Freq: Every day | ORAL | 1 refills | Status: DC

## 2018-04-15 MED ORDER — HYDROCHLOROTHIAZIDE 25 MG PO TABS: 25.0000 mg | ORAL_TABLET | Freq: Every day | ORAL | 1 refills | Status: DC

## 2018-04-15 MED FILL — HYDROCHLOROTHIAZIDE 25 MG T: 25 | 90 days supply | Qty: 90 | Fill #0

## 2018-04-15 MED FILL — LISINOPRIL 20 MG TAB: 20 | 90 days supply | Qty: 90 | Fill #0

## 2018-04-15 NOTE — Progress Notes (Signed)
Patient ID: Crystal Whitaker, female   DOB: Jul 31, 1966, 52 y.o.   MRN: [id number]   Crystal Whitaker, is a 52 y.o. female  BJY:[id number]  [id number]  DOB - 02/25/1966  Subjective:  Chief Complaint and HPI: [name] is a 52 y.o. female here today for med RF.  She has been out of HCTZ for about 2 weeks.  No HA/CP/SOB/dizziness.    ROS:   Constitutional:  No f/c, No night sweats, No unexplained weight loss. EENT:  No vision changes, No blurry vision, No hearing changes. No mouth, throat, or ear problems.  Respiratory: No cough, No SOB Cardiac: No CP, no palpitations GI:  No abd pain, No N/V/D. GU: No Urinary s/sx Musculoskeletal: No joint pain Neuro: No headache, no dizziness, no motor weakness.  Skin: No rash Endocrine:  No polydipsia. No polyuria.  Psych: Denies SI/HI  No problems updated.  ALLERGIES: No Known Allergies  PAST MEDICAL HISTORY: Past Medical History:  Diagnosis Date  . Back pain   . Hypertension   . Sinusitis     MEDICATIONS AT HOME: Prior to Admission medications   Medication Sig Start Date End Date Taking? Authorizing Provider  acetaminophen (TYLENOL) 500 MG tablet Take 2 tablets (1,000 mg total) by mouth every 6 (six) hours as needed for moderate pain. 01/12/17  Yes Hairston, Oren Beckmann, FNP  benzocaine (ORAJEL) 10 % mucosal gel Use as directed 1 application in the mouth or throat as needed for mouth pain. 01/12/17  Yes Hairston, Mandesia R, FNP  naproxen (NAPROSYN) 500 MG tablet TAKE ONE TABLET BY MOUTH TWICE A DAY WITH MEALS FOR 5 DAYS. THEN TAKE ONE TABLET BY MOUTH DAILY WITH MEAL AS NEEDED FOR PAIN. 09/09/17  Yes Hairston, Mandesia R, FNP  hydrochlorothiazide (HYDRODIURIL) 25 MG tablet Take 1 tablet (25 mg total) by mouth daily. 04/15/18   Anders Simmonds, PA-C  lisinopril (PRINIVIL,ZESTRIL) 20 MG tablet Take 1 tablet (20 mg total) by mouth daily. 04/15/18   Anders Simmonds, PA-C  cetirizine (ZYRTEC) 10 MG tablet Take 1 tablet (10 mg total) by  mouth daily. 10/29/12 02/04/13  Johnson, Clanford L, MD  loratadine (CLARITIN) 10 MG tablet Take 1 tablet (10 mg total) by mouth daily. 10/15/12 10/29/12  Meredeth Ide, MD     Objective:  EXAM:   Vitals:   04/15/18 1410  BP: (!) 164/83  Pulse: 74  Resp: 18  Temp: 98 F (36.7 C)  TempSrc: Oral  SpO2: 99%  Weight: 135 lb (61.2 kg)  Height: 4\' 11"  (1.499 m)    General appearance : A&OX3. NAD. Non-toxic-appearing HEENT: Atraumatic and Normocephalic.  PERRLA. EOM intact.   Neck: supple, no JVD. No cervical lymphadenopathy. No thyromegaly Chest/Lungs:  Breathing-non-labored, Good air entry bilaterally, breath sounds normal without rales, rhonchi, or wheezing  CVS: S1 S2 regular, no murmurs, gallops, rubs  Extremities: Bilateral Lower Ext shows no edema, both legs are warm to touch with = pulse throughout Neurology:  CN II-XII grossly intact, Non focal.   Psych:  TP linear. J/I WNL. Normal speech. Appropriate eye contact and affect.  Skin:  No Rash  Data Review No results found for: HGBA1C   Assessment & Plan   1. Essential hypertension Not controlled but ran out of HCTZ about 2 weeks ago and hasn't taken Lisinopril today.   - hydrochlorothiazide (HYDRODIURIL) 25 MG tablet; Take 1 tablet (25 mg total) by mouth daily.  Dispense: 90 tablet; Refill: 1 - lisinopril (PRINIVIL,ZESTRIL) 20 MG tablet; Take 1 tablet (20  mg total) by mouth daily.  Dispense: 90 tablet; Refill: 1 - Comprehensive metabolic panel Check blood pressure out of the office 3 times weekly and record.  If >130/85 on average make an appointment to follow up sooner than 4 months    Patient have been counseled extensively about nutrition and exercise  Return in about 4 months (around 08/15/2018) for assign PCP; f/up htn.  The patient was given clear instructions to go to ER or return to medical center if symptoms don't improve, worsen or new problems develop. The patient verbalized understanding. The patient was  told to call to get lab results if they haven't heard anything in the next week.     Georgian CoAngela Jossalyn Forgione, PA-C [facility] [address] [place], [place] [phone]   04/15/2018, 2:14 PM

## 2018-04-15 NOTE — Patient Instructions (Signed)
Check blood pressure out of the office 3 times weekly and record.  If >130/85 on average make an appointment to follow up sooner than 4 months

## 2018-04-16 LAB — COMPREHENSIVE METABOLIC PANEL
A/G RATIO: 1.5 (ref 1.2–2.2)
ALBUMIN: 4.3 g/dL (ref 3.5–5.5)
ALT: 11 IU/L (ref 0–32)
AST: 12 IU/L (ref 0–40)
Alkaline Phosphatase: 80 IU/L (ref 39–117)
BUN / CREAT RATIO: 15 (ref 9–23)
BUN: 9 mg/dL (ref 6–24)
Bilirubin Total: <0.2 mg/dL (ref 0.0–1.2)
CALCIUM: 9.4 mg/dL (ref 8.7–10.2)
CO2: 23 mmol/L (ref 20–29)
CREATININE: 0.59 mg/dL (ref 0.57–1.00)
Chloride: 102 mmol/L (ref 96–106)
GFR, EST AFRICAN AMERICAN: 120 mL/min/{1.73_m2} (ref >59)
GFR, EST NON AFRICAN AMERICAN: 104 mL/min/{1.73_m2} (ref >59)
GLOBULIN, TOTAL: 2.8 g/dL (ref 1.5–4.5)
Glucose: 121 mg/dL — ABNORMAL HIGH (ref 65–99)
POTASSIUM: 3 mmol/L — AB (ref 3.5–5.2)
SODIUM: 141 mmol/L (ref 134–144)
TOTAL PROTEIN: 7.1 g/dL (ref 6.0–8.5)

## 2018-04-19 ENCOUNTER — Telehealth (INDEPENDENT_AMBULATORY_CARE_PROVIDER_SITE_OTHER): Payer: Self-pay | Admitting: *Deleted

## 2018-04-19 ENCOUNTER — Other Ambulatory Visit: Payer: Self-pay | Admitting: Physician Assistant

## 2018-04-19 MED ORDER — POTASSIUM CHLORIDE ER 10 MEQ PO TBCR: 20.0000 meq | EXTENDED_RELEASE_TABLET | Freq: Every day | ORAL | 3 refills | Status: DC

## 2018-04-19 NOTE — Telephone Encounter (Signed)
-----   Message from Anders SimmondsAngela M McClung, [place]-C sent at 04/19/2018 11:25 AM EDT ----- Please call patient.  Her potassium is low.  I sent her a prescription of potassium to [facility] for her ton start.  Her blood sugar was slightly elevated, so she should cut down on sugar intake.  We will recheck her potassium at her next follow-up. Thanks, Georgian CoAngela Mcclung, PA-C

## 2018-04-19 NOTE — Telephone Encounter (Signed)
Patient verified DOB Patient is aware of potassium level being low and needing to take two tablets daily and have a recheck completed at the next visit. Patient is also aware of sugar level being elevated and needing to limit sugar intake.  No further questions.

## 2018-04-23 ENCOUNTER — Ambulatory Visit: Payer: Self-pay | Admitting: Urgent Care

## 2018-08-06 ENCOUNTER — Encounter: Payer: Self-pay | Admitting: Family Medicine

## 2018-08-06 ENCOUNTER — Ambulatory Visit: Payer: BLUE CROSS/BLUE SHIELD | Attending: Family Medicine | Admitting: Family Medicine

## 2018-08-06 ENCOUNTER — Other Ambulatory Visit: Payer: Self-pay

## 2018-08-06 VITALS — BP 166/101 | HR 78 | Temp 98.2°F | Resp 18 | Ht 59.0 in | Wt 135.4 lb

## 2018-08-06 DIAGNOSIS — G8929 Other chronic pain: Secondary | ICD-10-CM

## 2018-08-06 DIAGNOSIS — I10 Essential (primary) hypertension: Secondary | ICD-10-CM

## 2018-08-06 DIAGNOSIS — R21 Rash and other nonspecific skin eruption: Secondary | ICD-10-CM

## 2018-08-06 DIAGNOSIS — R739 Hyperglycemia, unspecified: Secondary | ICD-10-CM

## 2018-08-06 DIAGNOSIS — Z23 Encounter for immunization: Secondary | ICD-10-CM

## 2018-08-06 DIAGNOSIS — M25562 Pain in left knee: Secondary | ICD-10-CM | POA: Diagnosis not present

## 2018-08-06 DIAGNOSIS — E876 Hypokalemia: Secondary | ICD-10-CM

## 2018-08-06 LAB — POCT GLYCOSYLATED HEMOGLOBIN (HGB A1C)
HbA1c POC (<> result, manual entry): 5.4 %
HbA1c, POC (controlled diabetic range): 5.4 % (ref 0.0–7.0)
HbA1c, POC (prediabetic range): 5.4 % — AB (ref 5.7–6.4)
Hemoglobin A1C: 5.4 % (ref 4.0–5.6)

## 2018-08-06 MED ORDER — TRIAMCINOLONE ACETONIDE 0.1 % EX CREA: 1.0000 "application " | TOPICAL_CREAM | Freq: Two times a day (BID) | CUTANEOUS | 0 refills | Status: AC

## 2018-08-06 MED ORDER — DICLOFENAC SODIUM 1 % TD GEL: 4.0000 g | Freq: Four times a day (QID) | TRANSDERMAL | 6 refills | Status: DC

## 2018-08-06 MED ORDER — HYDROCHLOROTHIAZIDE 25 MG PO TABS: 25.0000 mg | ORAL_TABLET | Freq: Every day | ORAL | 1 refills | Status: DC

## 2018-08-06 MED ORDER — LISINOPRIL 20 MG PO TABS: 20.0000 mg | ORAL_TABLET | Freq: Every day | ORAL | 4 refills | Status: DC

## 2018-08-06 NOTE — Progress Notes (Signed)
Flu shot: yes   Patient complained of left knee pain on and off but not present at the moment.  A rash on her middle finger on the left hand that appeared 1 week ago.

## 2018-08-06 NOTE — Progress Notes (Signed)
Subjective:    Patient ID: Crystal Whitaker, female    DOB: 28-Jan-1966, 52 y.o.   MRN: [id number]  HPI       52 year old female new to me as a patient who is established here and was last seen on 04/15/2018 in follow-up of hypertension.  Patient's blood pressure at that time was 164/83 and she had been out of her hydrochlorothiazide but per her note, patient was still on lisinopril.  Patient had blood work done at her last visit on 04/15/2018 which showed a glucose of 121 and patient had a potassium level of 3.0.  Per chart records, patient was called regarding her abnormal labs and prescription was sent to her pharmacy for potassium and patient was to have her electrolytes rechecked at her next visit.  Patient's blood pressure at today's visit is elevated at 155/101.  Patient states that she has been taking her hydrochlorothiazide 25 mg daily.  Patient states that she takes her medication at night and did take a dose last night.  Patient also states that she needs a refill of potassium. (Patient has prescription bottles with her for hydrochlorothiazide and for potassium.  Patient has about 3 pills left of the potassium.) Patient does not wish to take any additional medication for her blood pressure.  Patient denies any headaches or dizziness related to her blood pressure.  Patient denies any issues with chest pain or palpitations, no shortness of breath or cough.  Patient reports that she does have occasional low back pain.  Patient denies any urinary frequency, urgency or dysuria.  Patient denies any issues with muscle cramping.      Patient has noticed onset of an itchy rash on her left middle finger for the past week.  Patient has not tried any over-the-counter medications or creams.  Patient does not recall coming in contact with anything out of the normal.  Patient also with complaint of some recurrent pain in her left knee.  Patient states that the pain generally occurs with walking especially going up or  down steps.  Patient states that the pain is about a 6 on a 0-10 scale.  Patient would prefer a cream to put on her knee rather than a pill.  Patient does not feel as if she has any swelling in her knee and has noticed no increased warmth to the knee.  Past Medical History:  Diagnosis Date  . Back pain   . Hypertension   . Sinusitis    Past Surgical History:  Procedure Laterality Date  . CESAREAN SECTION     At today's visit, patient reports family history significant for her mother, brother and sister with hypertension  Social History   Tobacco Use  . Smoking status: Never Smoker  . Smokeless tobacco: Never Used  Substance Use Topics  . Alcohol use: No  . Drug use: No  No Known Allergies    Review of Systems  Constitutional: Positive for fatigue (Occasional). Negative for appetite change, chills, diaphoresis and fever.  Respiratory: Negative for cough and shortness of breath.   Cardiovascular: Negative for chest pain, palpitations and leg swelling.  Gastrointestinal: Negative for abdominal pain and nausea.  Endocrine: Negative for polydipsia, polyphagia and polyuria.  Genitourinary: Negative for dysuria and frequency.  Musculoskeletal: Positive for arthralgias and back pain. Negative for gait problem, joint swelling and myalgias.  Skin: Positive for rash. Negative for wound.  Neurological: Negative for dizziness and headaches.       Objective:   Physical Exam  BP (!) 155/101   Pulse 78   Temp 98.2 F (36.8 C) (Oral)   Resp 18   Ht 4\' 11"  (1.499 m)   Wt 135 lb 6.4 oz (61.4 kg)   LMP 06/04/2011   SpO2 99%   BMI 27.35 kg/m  Nurse's notes and vital signs reviewed General-well-nourished, well-developed short statured older female in no acute distress Neck-supple, no lymphadenopathy, no thyromegaly, no carotid bruit Cardiovascular- regular rate and rhythm, patient was initially thought to have a cardiac murmur but this was not heard when patient was asked to hold her  breath during cardiac exam Lungs-clear to auscultation bilaterally Abdomen-soft, nontender Back-no CVA tenderness, patient with mild lumbosacral discomfort to palpation Extremities-no edema Musculoskeletal- patient with some bilateral joint line tenderness of the knees but no evidence of swelling/effusion and no joint instability Skin- patient with a dry, hyperpigmented rash on the dorsum of the left middle finger over the PIP joint to below the DIP joint     Assessment & Plan:  1. Uncontrolled hypertension Patient's blood pressure is uncontrolled at today's visit.  Patient's chart indicates that she should be on both hydrochlorothiazide and lisinopril.  Patient has bottles with her at today's visit for hydrochlorothiazide and potassium for which she is asking for refills.  I did have the CMA contact patient's pharmacy and patient did get 90-day supply of lisinopril filled in June.  Prescriptions will be sent to patient's pharmacy for her hydrochlorothiazide and lisinopril.  Will check BMP to see if any additional potassium as needed.  Patient should take both the hydrochlorothiazide and lisinopril as her blood pressure is not controlled on one agent but I am unsure if patient will actually follow these directions.  Patient will be scheduled to return to clinic in 2 weeks for blood pressure rechecked by the clinical pharmacist and hopefully this will also confirm if patient has been compliant with taking both of her blood pressure medications. - Basic Metabolic Panel  2. Hypokalemia Patient with hypokalemia on prior labs done in June with potassium of 3.0.  Patient did have a prescription at that time sent in for potassium but I am not sure if patient continues to take potassium 10 mEq twice daily but she is requesting a refill of her prescription at today's visit.  Potassium will not be refilled but will check patient's electrolytes to see if her potassium is low.  If patient is taking both the  hydrochlorothiazide and lisinopril, the medication should offset one another and her potassium level should remain within normal however patient may not be compliant with the use of lisinopril, just the hydrochlorothiazide. - Basic Metabolic Panel  3. Elevated blood sugar On the review of patient's chart, patient with an elevated blood sugar earlier in the year on blood work.  Patient had hemoglobin A1c at today's visit but this was within normal.  Patient will also have BMP.  Patient denies any increased thirst, urinary frequency or increased appetite which are symptoms associated with diabetes.  Patient's hemoglobin A1c was normal at 5.4 - Basic Metabolic Panel - HgB A1c  4. Chronic pain of left knee I discussed with the patient that based on her examination she likely has osteoarthritis as the cause of her knee pain.  Patient wanted to have a cream to use for pain as needed and prescription was sent in for patient to diclofenac gel to use as needed for knee pain. - diclofenac sodium (VOLTAREN) 1 % GEL; Apply 4 g topically 4 (four) times  daily. As needed to relieve pain in left knee  Dispense: 1 Tube; Refill: 6  5. Rash Patient with a rash on the left middle finger which could represent a contact dermatitis versus atopic dermatitis.  Prescription sent to patient's pharmacy for triamcinolone cream to use twice daily for the next 7 to 10 days but patient should call or return if the rash does not resolve - triamcinolone cream (KENALOG) 0.1 %; Apply 1 application topically 2 (two) times daily. To area of skin rash  Dispense: 30 g; Refill: 0  6. Need for immunization against influenza Patient was offered and agreed to have influenza immunization at today's visit.  Patient was also given handout regarding her immunization - Flu Vaccine QUAD 36+ mos IM  7. Essential hypertension On review of patient's notes, patient is supposed to be on hydrochlorothiazide 25 mg and lisinopril 20 mg daily for  treatment of hypertension.  Patient did have refills sent to her pharmacy of these medications.  Patient's blood pressure has remained elevated at prior visits but per notes, she was always on either one or the other medication after running out of a medication.  Patient has been asked to return to clinic in 2 weeks to meet with clinical pharmacist for recheck of her blood pressure.  Return in about 4 months (around 12/07/2018) for BP check in 2 weeks with Franky Macho.    - hydrochlorothiazide (HYDRODIURIL) 25 MG tablet; Take 1 tablet (25 mg total) by mouth daily.  Dispense: 90 tablet; Refill: 1 - lisinopril (PRINIVIL,ZESTRIL) 20 MG tablet; Take 1 tablet (20 mg total) by mouth daily. To lower blood pressure  Dispense: 30 tablet; Refill: 4

## 2018-08-07 LAB — BASIC METABOLIC PANEL WITH GFR
BUN/Creatinine Ratio: 15 (ref 9–23)
BUN: 9 mg/dL (ref 6–24)
CO2: 26 mmol/L (ref 20–29)
Calcium: 9.9 mg/dL (ref 8.7–10.2)
Chloride: 99 mmol/L (ref 96–106)
Creatinine, Ser: 0.62 mg/dL (ref 0.57–1.00)
GFR calc Af Amer: 118 mL/min/1.73
GFR calc non Af Amer: 103 mL/min/1.73
Glucose: 112 mg/dL — ABNORMAL HIGH (ref 65–99)
Potassium: 3.6 mmol/L (ref 3.5–5.2)
Sodium: 140 mmol/L (ref 134–144)

## 2018-08-11 ENCOUNTER — Telehealth: Payer: Self-pay

## 2018-08-11 NOTE — Telephone Encounter (Signed)
-----   Message from Cain Saupe, MD sent at 08/08/2018  4:19 PM EDT ----- Notify patient that her BMP was normal. Please ask patient to make sure that she takes both the lisinopril and the hydrochlorithiazide. Return in a few weeks for recheck of blood pressure and if patient would like to do so her potassium can be rechecked at that time

## 2018-08-11 NOTE — Telephone Encounter (Signed)
Patient was called, patient verified dob and was given most recent lab results. Patient stated she is currently taking both bp medications and will follow up as planned. Patient verbalized understanding and had no further questions.

## 2018-10-06 ENCOUNTER — Ambulatory Visit: Payer: BLUE CROSS/BLUE SHIELD | Attending: Family Medicine | Admitting: Pharmacist

## 2018-10-06 VITALS — BP 145/75 | HR 77

## 2018-10-06 DIAGNOSIS — I10 Essential (primary) hypertension: Secondary | ICD-10-CM | POA: Diagnosis present

## 2018-10-06 DIAGNOSIS — Z79899 Other long term (current) drug therapy: Secondary | ICD-10-CM | POA: Diagnosis not present

## 2018-10-06 NOTE — Progress Notes (Signed)
   S:    PCP: Dr. Jillyn HiddenFulp  Patient arrives in good spirits. Presents to the clinic for hypertension management. Patient was referred by Dr. Jillyn HiddenFulp on 08/06/2018. BP at that visit 166/101. Lisinopril was added to regimen at that visit.  Patient denies adherence with medications.  Current BP Medications include:   - HCTZ 25 mg daily - Lisinopril 20 mg daily. Reports not taking  Dietary habits include: limits salt, does not drink caffeine  Exercise habits include: walks ~30 minutes every day Family / Social history: HTN (mother, brother and sister), never smoker, denies drinking alcohol  Home BP readings: does not take at home  O:  L arm after 5 minutes rest: 145/75, HR 77  Last 3 Office BP readings: BP Readings from Last 3 Encounters:  08/06/18 (!) 166/101  04/15/18 (!) 164/83  09/09/17 (!) 153/91   BMET    Component Value Date/Time   NA 140 08/06/2018 1507   K 3.6 08/06/2018 1507   CL 99 08/06/2018 1507   CO2 26 08/06/2018 1507   GLUCOSE 112 (H) 08/06/2018 1507   GLUCOSE 81 10/23/2016 0954   BUN 9 08/06/2018 1507   CREATININE 0.62 08/06/2018 1507   CREATININE 0.45 (L) 10/23/2016 0954   CALCIUM 9.9 08/06/2018 1507   GFRNONAA 103 08/06/2018 1507   GFRNONAA >89 03/07/2015 1433   GFRAA 118 08/06/2018 1507   GFRAA >89 03/07/2015 1433   Renal function: CrCl cannot be calculated (Patient's most recent lab result is older than the maximum 21 days allowed.).  Clinical ASCVD: No  The 10-year ASCVD risk score Denman George(Goff DC Jr., et al., 2013) is: 4.5%   Values used to calculate the score:     Age: 52 years     Sex: Female     Is Non-Hispanic African American: No     Diabetic: No     Tobacco smoker: No     Systolic Blood Pressure: 166 mmHg     Is BP treated: Yes     HDL Cholesterol: 52 mg/dL     Total Cholesterol: 207 mg/dL  A/P: Hypertension longstanding currently uncontrolled on current medications. BP Goal <130/80 mmHg. Patient is not adherent with lisinopril but does endorse  compliance with HCTZ. I called her pharmacy to ensure that both were refilled. She agrees to continue both medications for BP.   -Continued HCTZ 25 mg daily. -Will have patient re-start lisinopril 20 mg daily. -Counseled on lifestyle modifications for blood pressure control including reduced dietary sodium, increased exercise, adequate sleep  Results reviewed and written information provided. Total time in face-to-face counseling 15 minutes.   F/U Clinic Visit in 1 month for BP re-check.   Butch PennyLuke Van Ausdall, PharmD, CPP Clinical Pharmacist [facility] [phone]

## 2018-10-06 NOTE — Patient Instructions (Signed)
Thank you for coming to see us today.   Blood pressure today is still elevated.   Please take HCTZ and lisinopril. [facility]   Limiting salt and caffeine, as well as exercising as able for at least 30 minutes for 5 days out of the week, can also help you lower your blood pressure.  Take your blood pressure at home if you are able. Please write down these numbers and bring them to your visits.  If you have any questions about medications, please call me [phone].  Crystal Whitaker

## 2018-10-07 ENCOUNTER — Encounter: Payer: Self-pay | Admitting: Pharmacist

## 2018-10-29 ENCOUNTER — Ambulatory Visit (HOSPITAL_COMMUNITY)
Admission: EM | Admit: 2018-10-29 | Discharge: 2018-10-29 | Disposition: A | Discharge status: Home / Self Care | Payer: BLUE CROSS/BLUE SHIELD | Attending: Internal Medicine | Admitting: Internal Medicine

## 2018-10-29 ENCOUNTER — Ambulatory Visit (INDEPENDENT_AMBULATORY_CARE_PROVIDER_SITE_OTHER): Payer: BLUE CROSS/BLUE SHIELD

## 2018-10-29 ENCOUNTER — Encounter (HOSPITAL_COMMUNITY): Payer: Self-pay | Admitting: Emergency Medicine

## 2018-10-29 DIAGNOSIS — R05 Cough: Secondary | ICD-10-CM

## 2018-10-29 DIAGNOSIS — J40 Bronchitis, not specified as acute or chronic: Secondary | ICD-10-CM | POA: Insufficient documentation

## 2018-10-29 DIAGNOSIS — I10 Essential (primary) hypertension: Secondary | ICD-10-CM | POA: Diagnosis not present

## 2018-10-29 MED ORDER — AZITHROMYCIN 250 MG PO TABS: 250.0000 mg | ORAL_TABLET | Freq: Every day | ORAL | 0 refills | Status: DC

## 2018-10-29 MED ORDER — BENZONATATE 100 MG PO CAPS: ORAL_CAPSULE | ORAL | 0 refills | Status: DC

## 2018-10-29 NOTE — ED Triage Notes (Signed)
Pt c/o headache, cough, fever, runny nose, x1 week.

## 2018-10-29 NOTE — Discharge Instructions (Addendum)
Your chest xray does not show pneumonia, but is showing border line enlarged heart which occurs from poor controlled high blood pressure. Please make sure you always take your blood pressure to prevent heart failure in the future.   I believe you have had the flu, but is too late to put you on Tamiflu since it has been longer than 3 days since your sickness. I am concerned you are getting bronchitis, so I will place you on an antibiotic called Z-pack. Take it til gone.  I am also sending little capsules for cough if the cough is disrupting your sleep, of its so hard it makes you want to  vomit.

## 2018-10-29 NOTE — ED Provider Notes (Signed)
[facility]    CSN: [id number] Arrival date & time: 10/29/18  1723     History   Chief Complaint Chief Complaint  Patient presents with  . URI    HPI Crystal Whitaker is a 53 y.o. female.   Onset of fever, coughing, rhinitis and headaches. Two days ago developed body aches. Has been taking OTC med safe with HTN which helps a little. She forgot to take her BP med this am. Her cough is getting worse and is yellow, and today saw a little tinge on blood. Denies SOB or chest pain.      Past Medical History:  Diagnosis Date  . Back pain   . Hypertension   . Sinusitis     Patient Active Problem List   Diagnosis Date Noted  . Headache(784.0) 02/22/2014  . Knee pain, acute 04/21/2013  . HYPERTENSION, BENIGN ESSENTIAL 12/15/2008  . DEGENERATIVE JOINT DISEASE, SHOULDER 12/15/2008  . LOW BACK PAIN SYNDROME 08/02/2007    Past Surgical History:  Procedure Laterality Date  . CESAREAN SECTION      OB History   No obstetric history on file.      Home Medications    Prior to Admission medications   Medication Sig Start Date End Date Taking? Authorizing Provider  acetaminophen (TYLENOL) 500 MG tablet Take 2 tablets (1,000 mg total) by mouth every 6 (six) hours as needed for moderate pain. 01/12/17   Lizbeth Bark, FNP  azithromycin (ZITHROMAX) 250 MG tablet Take 1 tablet (250 mg total) by mouth daily. Take first 2 tablets together, then 1 every day until finished. 10/29/18   Rodriguez-Southworth, Nettie Elm, PA-C  benzocaine (ORAJEL) 10 % mucosal gel Use as directed 1 application in the mouth or throat as needed for mouth pain. 01/12/17   Lizbeth Bark, FNP  benzonatate (TESSALON) 100 MG capsule 1-2 tid prn cough 10/29/18   Rodriguez-Southworth, Nettie Elm, PA-C  diclofenac sodium (VOLTAREN) 1 % GEL Apply 4 g topically 4 (four) times daily. As needed to relieve pain in left knee 08/06/18   Fulp, Cammie, MD  hydrochlorothiazide (HYDRODIURIL) 25 MG tablet Take 1  tablet (25 mg total) by mouth daily. 08/06/18   Fulp, Cammie, MD  lisinopril (PRINIVIL,ZESTRIL) 20 MG tablet Take 1 tablet (20 mg total) by mouth daily. To lower blood pressure 08/06/18   Fulp, Cammie, MD  naproxen (NAPROSYN) 500 MG tablet TAKE ONE TABLET BY MOUTH TWICE A DAY WITH MEALS FOR 5 DAYS. THEN TAKE ONE TABLET BY MOUTH DAILY WITH MEAL AS NEEDED FOR PAIN. 09/09/17   Lizbeth Bark, FNP  potassium chloride (K-DUR) 10 MEQ tablet Take 2 tablets (20 mEq total) by mouth daily. 04/19/18   Anders Simmonds, PA-C  triamcinolone cream (KENALOG) 0.1 % Apply 1 application topically 2 (two) times daily. To area of skin rash 08/06/18   Cain Saupe, MD    Family History Family History  Family history unknown: Yes    Social History Social History   Tobacco Use  . Smoking status: Never Smoker  . Smokeless tobacco: Never Used  Substance Use Topics  . Alcohol use: No  . Drug use: No     Allergies   Patient has no known allergies.   Review of Systems Review of Systems  Constitutional: Positive for chills, diaphoresis and fever. Negative for appetite change.  HENT: Positive for congestion, postnasal drip, rhinorrhea and sore throat. Negative for ear discharge, ear pain, mouth sores, sinus pressure, sinus pain and trouble swallowing.   Eyes:  Negative for discharge.  Respiratory: Positive for cough. Negative for chest tightness, shortness of breath and wheezing.   Cardiovascular: Negative for chest pain, palpitations and leg swelling.  Gastrointestinal: Negative for nausea and vomiting.  Genitourinary: Negative for difficulty urinating.  Musculoskeletal: Positive for myalgias.  Skin: Negative for rash.  Neurological: Positive for headaches.  Hematological: Negative for adenopathy.   Physical Exam Triage Vital Signs ED Triage Vitals  Enc Vitals Group     BP 10/29/18 1832 (!) 163/83     Pulse Rate 10/29/18 1832 88     Resp 10/29/18 1832 16     Temp 10/29/18 1832 97.9 F (36.6  C)     Temp src --      SpO2 10/29/18 1832 100 %     Weight --      Height --      Head Circumference --      Peak Flow --      Pain Score 10/29/18 1833 8     Pain Loc --      Pain Edu? --      Excl. in GC? --    No data found.  Updated Vital Signs BP (!) 160/92   Pulse 88   Temp 97.9 F (36.6 C)   Resp 16   LMP 06/04/2011   SpO2 100%   Visual Acuity Right Eye Distance:   Left Eye Distance:   Bilateral Distance:    Right Eye Near:   Left Eye Near:    Bilateral Near:     Physical Exam Constitutional:      General: She is not in acute distress.    Appearance: She is ill-appearing. She is not toxic-appearing or diaphoretic.  HENT:     Head: Normocephalic.     Right Ear: Tympanic membrane, ear canal and external ear normal.     Left Ear: Tympanic membrane, ear canal and external ear normal.     Nose: Congestion and rhinorrhea present.     Mouth/Throat:     Mouth: Mucous membranes are moist.     Pharynx: Oropharynx is clear. No oropharyngeal exudate or posterior oropharyngeal erythema.  Eyes:     General: No scleral icterus.       Right eye: No discharge.        Left eye: No discharge.     Conjunctiva/sclera: Conjunctivae normal.  Neck:     Musculoskeletal: Neck supple. No neck rigidity.  Cardiovascular:     Rate and Rhythm: Normal rate and regular rhythm.     Heart sounds: No murmur.  Pulmonary:     Effort: Pulmonary effort is normal.     Breath sounds: Normal breath sounds. No wheezing, rhonchi or rales.  Musculoskeletal: Normal range of motion.  Lymphadenopathy:     Cervical: No cervical adenopathy.  Skin:    General: Skin is warm and dry.     Findings: No rash.  Neurological:     Mental Status: She is alert and oriented to person, place, and time.     Motor: No weakness.     Gait: Gait normal.  Psychiatric:        Mood and Affect: Mood normal.        Behavior: Behavior normal.        Thought Content: Thought content normal.        Judgment:  Judgment normal.    UC Treatments / Results  Labs (all labs ordered are listed, but only abnormal results are displayed) Labs Reviewed -  No data to display  EKG None  Radiology Dg Chest 2 View  Result Date: 10/29/2018 CLINICAL DATA:  Cough and fever EXAM: CHEST - 2 VIEW COMPARISON:  02/28/2014 FINDINGS: No acute consolidation or effusion. Borderline cardiomegaly with central congestion. No pneumothorax. IMPRESSION: 1. No focal pulmonary infiltrate 2. Borderline cardiomegaly. Electronically Signed   By: Jasmine Pang M.D.   On: 10/29/2018 19:22   Procedures Procedures  Medications Ordered in UC Medications - No data to display  Initial Impression / Assessment and Plan / UC Course  I have reviewed the triage vital signs and the nursing notes. Pertinent  imaging results that were available during my care of the patient were reviewed by me and considered in my medical decision making (see chart for details). I suspect she is getting a secondary infection like bronchitis. I placed her on Tessalon perless and Zpack I educated her about cardiomegally and the importance of taking her BP med daily and having good control of her BP. Advised to do BP diaries and take it  To her PCP next visit.   Final Clinical Impressions(s) / UC Diagnoses   Final diagnoses:  Bronchitis  Essential hypertension     Discharge Instructions     Your chest xray does not show pneumonia, but is showing border line enlarged heart which occurs from poor controlled high blood pressure. Please make sure you always take your blood pressure to prevent heart failure in the future.   I believe you have had the flu, but is too late to put you on Tamiflu since it has been longer than 3 days since your sickness. I am concerned you are getting bronchitis, so I will place you on an antibiotic called Z-pack. Take it til gone.  I am also sending little capsules for cough if the cough is disrupting your sleep, of its so hard  it makes you want to  vomit.     ED Prescriptions    Medication Sig Dispense Auth. Provider   benzonatate (TESSALON) 100 MG capsule 1-2 tid prn cough 21 capsule Rodriguez-Southworth, Anastacia Reinecke, PA-C   azithromycin (ZITHROMAX) 250 MG tablet Take 1 tablet (250 mg total) by mouth daily. Take first 2 tablets together, then 1 every day until finished. 6 tablet Rodriguez-Southworth, Nettie Elm, PA-C     Controlled Substance Prescriptions [place] Controlled Substance Registry consulted?    Garey Ham, Cordelia Poche 10/29/18 2043

## 2018-11-08 ENCOUNTER — Ambulatory Visit: Payer: BLUE CROSS/BLUE SHIELD | Attending: Family Medicine | Admitting: Pharmacist

## 2018-11-08 ENCOUNTER — Encounter: Payer: BLUE CROSS/BLUE SHIELD | Admitting: Pharmacist

## 2018-11-08 VITALS — BP 116/73 | HR 76

## 2018-11-08 DIAGNOSIS — I10 Essential (primary) hypertension: Secondary | ICD-10-CM

## 2018-11-08 NOTE — Progress Notes (Signed)
   S:    PCP: Dr. Jillyn Hidden  Patient arrives in good spirits. Presents to the clinic for hypertension management. Patient was referred by Dr. Jillyn Hidden on 08/06/2018. I last saw her on 10/06/18 and re-started her lisinopril as patient reported non-adherence.  Since then, pt was seen in the ED (10/29/2018) and reported continued non-adherence. Today, she reports doing a better job with taking her BP medications. She reports that she has not missed any doses since ED visit.   Patient reports adherence with medications. Denies chest pain, shortness of breath, blurred vision or HA.   Current BP Medications include:   - HCTZ 25 mg daily - Lisinopril 20 mg daily.   Dietary habits include: limits salt, does not drink caffeine  Exercise habits include: walks ~30 minutes every day Family / Social history: HTN (mother, brother and sister), never smoker, denies drinking alcohol  Home BP readings: reports upper 130s/80s since ED encounter  O:  L arm after 5 minutes rest: 116/73, HR 76  Last 3 Office BP readings: BP Readings from Last 3 Encounters:  11/08/18 116/73  10/29/18 (!) 160/92  10/06/18 (!) 145/75   BMET    Component Value Date/Time   NA 140 08/06/2018 1507   K 3.6 08/06/2018 1507   CL 99 08/06/2018 1507   CO2 26 08/06/2018 1507   GLUCOSE 112 (H) 08/06/2018 1507   GLUCOSE 81 10/23/2016 0954   BUN 9 08/06/2018 1507   CREATININE 0.62 08/06/2018 1507   CREATININE 0.45 (L) 10/23/2016 0954   CALCIUM 9.9 08/06/2018 1507   GFRNONAA 103 08/06/2018 1507   GFRNONAA >89 03/07/2015 1433   GFRAA 118 08/06/2018 1507   GFRAA >89 03/07/2015 1433   Renal function: CrCl cannot be calculated (Patient's most recent lab result is older than the maximum 21 days allowed.).  Clinical ASCVD: No  The 10-year ASCVD risk score Denman George DC Jr., et al., 2013) is: 2.4%   Values used to calculate the score:     Age: 53 years     Sex: Female     Is Non-Hispanic African American: No     Diabetic: No     Tobacco  smoker: No     Systolic Blood Pressure: 116 mmHg     Is BP treated: Yes     HDL Cholesterol: 52 mg/dL     Total Cholesterol: 207 mg/dL  A/P: Hypertension longstanding currently uncontrolled on current medications. BP Goal <130/80 mmHg. Patient is adherent with medications.   -Continued HCTZ 25 mg, lisinopril 20 mg daily  -Counseled on lifestyle modifications for blood pressure control including reduced dietary sodium, increased exercise, adequate sleep  Results reviewed and written information provided. Total time in face-to-face counseling 15 minutes.   F/U Clinic Visit in 1 month for BP re-check.   Butch Penny, PharmD, CPP Clinical Pharmacist [facility] [phone]

## 2018-11-08 NOTE — Patient Instructions (Signed)

## 2018-11-22 ENCOUNTER — Ambulatory Visit: Payer: BLUE CROSS/BLUE SHIELD | Admitting: Family Medicine

## 2018-11-24 ENCOUNTER — Other Ambulatory Visit: Payer: Self-pay | Admitting: Physician Assistant

## 2018-11-30 ENCOUNTER — Encounter: Payer: Self-pay | Admitting: Family Medicine

## 2018-11-30 ENCOUNTER — Ambulatory Visit: Payer: BLUE CROSS/BLUE SHIELD | Attending: Family Medicine | Admitting: Family Medicine

## 2018-11-30 VITALS — BP 135/76 | HR 83 | Temp 99.1°F | Ht 60.0 in | Wt 133.0 lb

## 2018-11-30 DIAGNOSIS — G8929 Other chronic pain: Secondary | ICD-10-CM

## 2018-11-30 DIAGNOSIS — Z1211 Encounter for screening for malignant neoplasm of colon: Secondary | ICD-10-CM

## 2018-11-30 DIAGNOSIS — E876 Hypokalemia: Secondary | ICD-10-CM

## 2018-11-30 DIAGNOSIS — I10 Essential (primary) hypertension: Secondary | ICD-10-CM

## 2018-11-30 DIAGNOSIS — M25561 Pain in right knee: Secondary | ICD-10-CM

## 2018-11-30 DIAGNOSIS — M25562 Pain in left knee: Secondary | ICD-10-CM | POA: Diagnosis not present

## 2018-11-30 DIAGNOSIS — Z23 Encounter for immunization: Secondary | ICD-10-CM

## 2018-11-30 MED ORDER — LISINOPRIL 20 MG PO TABS: 20.0000 mg | ORAL_TABLET | Freq: Every day | ORAL | 1 refills | Status: DC

## 2018-11-30 MED ORDER — POTASSIUM CHLORIDE ER 10 MEQ PO TBCR: 20.0000 meq | EXTENDED_RELEASE_TABLET | Freq: Every day | ORAL | 1 refills | Status: DC

## 2018-11-30 MED ORDER — NAPROXEN 500 MG PO TABS: ORAL_TABLET | ORAL | 3 refills | Status: DC

## 2018-11-30 MED ORDER — TETANUS-DIPHTH-ACELL PERTUSSIS 5-2.5-18.5 LF-MCG/0.5 IM SUSP
0.5000 mL | Freq: Once | INTRAMUSCULAR | Status: AC
  Administered 2018-11-30: 0.5 mL via INTRAMUSCULAR

## 2018-11-30 MED ORDER — DICLOFENAC SODIUM 1 % TD GEL: 4.0000 g | Freq: Four times a day (QID) | TRANSDERMAL | 6 refills | Status: DC

## 2018-11-30 MED ORDER — HYDROCHLOROTHIAZIDE 25 MG PO TABS: 25.0000 mg | ORAL_TABLET | Freq: Every day | ORAL | 1 refills | Status: DC

## 2018-11-30 NOTE — Progress Notes (Signed)
Subjective:    Patient ID: Crystal Whitaker, female    DOB: 01/15/1966, 53 y.o.   MRN: [id number]  HPI       53 yo female seen s/p urgent care visit on 10/29/2018 due to bronchitis. Patient also with hypertension which was uncontrolled at her last visit patient states that she does feel better than when she went to the urgent care on 10/29/2018 due to cough, shortness of breath, body aches, headache as well as fever and chills and recurrent cough.  Patient states that she believes that she had forgotten to take her blood pressure medication on the day of her urgent care visit and per urgent care records, blood pressure was 160/92.  Patient reports that she has completed the prescribed antibiotic, azithromycin.      She reports that she also is now taking her blood pressure medication daily and she denies any headaches or dizziness related to her blood pressure.  Patient states that her cough and shortness of breath have resolved.  Patient has had no recent fever or chills.  Overall patient feels much better.  She has had a history of osteoarthritis in her shoulders as well as degenerative disc disease in her lumbar spine.  Her main pain complaint however is recent onset of acute pain, within the past 3 weeks, in her right knee.  She does not recall any injury.  Patient has also had issues with chronic knee pain which is dull and aching and occasionally becomes sharp when she is walking.  Patient states that her right knee pain ranges from a 3-4 with no activity but as high as an 8 with activity such as going up or down stairs as well as increased pain if she is not walking. Patient has had prior joint injection for knee pain.  Patient states that her current knee pain is also worse when she is kneeling for prayer.  She does feel as if she has swelling in her knees at times.  Past Medical History:  Diagnosis Date  . Back pain   . Hypertension   . Sinusitis    Past Surgical History:  Procedure Laterality  Date  . CESAREAN SECTION     Family History  Problem Relation Age of Onset  . Hypertension Mother   . Hypertension Sister   . Hypertension Brother    Social History   Tobacco Use  . Smoking status: Never Smoker  . Smokeless tobacco: Never Used  Substance Use Topics  . Alcohol use: No  . Drug use: No   No Known Allergies   Review of Systems  Constitutional: Negative for chills and fever.  HENT: Negative for congestion, postnasal drip, rhinorrhea, sore throat and trouble swallowing.   Respiratory: Negative for cough and shortness of breath.   Cardiovascular: Negative for chest pain, palpitations and leg swelling.  Gastrointestinal: Negative for abdominal distention, abdominal pain, blood in stool, constipation and diarrhea.  Endocrine: Negative for cold intolerance, heat intolerance, polydipsia, polyphagia and polyuria.  Genitourinary: Negative for dysuria and frequency.  Musculoskeletal: Positive for arthralgias, back pain, gait problem and joint swelling.  Neurological: Negative for dizziness and headaches.  Hematological: Negative for adenopathy. Does not bruise/bleed easily.       Objective:   Physical Exam BP 135/76 (BP Location: Right Arm, Patient Position: Sitting, Cuff Size: Normal)   Pulse 83   Temp 99.1 F (37.3 C) (Oral)   Ht 5' (1.524 m)   Wt 133 lb (60.3 kg)   LMP 06/04/2011  SpO2 97%   BMI 25.97 kg/m Nurse's notes and vital signs reviewed General-well-nourished, well-developed small framed older female in no acute distress.  Patient is accompanied by her husband at today's visit Neck-supple, no lymphadenopathy, no carotid bruit Lungs-clear to auscultation bilaterally Cardiovascular-regular rate and regular rhythm Abdomen-soft, nontender Back-no CVA tenderness Extremities-no edema Musculoskeletal- patient with right lateral joint line tenderness at the knee and right tenderness over the lower patella/insertional patellar tendon.  Patient with a left  medial joint line tenderness to palpation      Assessment & Plan:  1. Essential hypertension Patient's blood pressure is reasonably controlled and near goal of 130/80 at today's visit.  Patient is given refills for her lisinopril and hydrochlorothiazide.  Patient will also have BMP done at today's visit as patient has had past hypokalemia for which she is currently on a potassium supplement. - Basic Metabolic Panel - potassium chloride (K-DUR) 10 MEQ tablet; Take 2 tablets (20 mEq total) by mouth daily.  Dispense: 180 tablet; Refill: 1 - lisinopril (PRINIVIL,ZESTRIL) 20 MG tablet; Take 1 tablet (20 mg total) by mouth daily. To lower blood pressure  Dispense: 90 tablet; Refill: 1 - hydrochlorothiazide (HYDRODIURIL) 25 MG tablet; Take 1 tablet (25 mg total) by mouth daily.  Dispense: 90 tablet; Refill: 1  2. Hypokalemia Refill of potassium chloride given secondary to past hyperkalemia and patient will have repeat BMP at today's visit. - Basic Metabolic Panel - potassium chloride (K-DUR) 10 MEQ tablet; Take 2 tablets (20 mEq total) by mouth daily.  Dispense: 180 tablet; Refill: 1  3. Acute pain of right knee Pati rnteports recent acute onset of right knee pain and patient has had issues with chronic left knee pain.  Patient reports that she has received prior knee injection in the past with some relief to the left knee and patient would like to be referred to orthopedics to see if steroid injection would be appropriate for the right knee.  Prescription provided for naproxen 500 mg twice daily with a meal as needed for pain.  Patient should eat prior to taking the medication to help avoid stomach upset.  Patient may also ice her knees for approximately 20 minutes at a time using an ice pack, applied over protected skin.  Patient may do this 2-3 times per day as needed for knee pain and swelling.   - AMB referral to orthopedics - naproxen (NAPROSYN) 500 MG tablet; TAKE ONE TABLET BY MOUTH TWICE A DAY   WITH MEAL AS NEEDED FOR PAIN.  Dispense: 60 tablet; Refill: 3  4. Chronic pain of left knee Patient will be referred to orthopedics in follow-up of her chronic knee pain and also given prescription for Voltaren gel to use up to 4 times daily as needed for knee pain and prescription for naproxen for pain not controlled by the Voltaren gel. - AMB referral to orthopedics - diclofenac sodium (VOLTAREN) 1 % GEL; Apply 4 g topically 4 (four) times daily. As needed to relieve pain in left knee  Dispense: 1 Tube; Refill: 6 - naproxen (NAPROSYN) 500 MG tablet; TAKE ONE TABLET BY MOUTH TWICE A DAY  WITH MEAL AS NEEDED FOR PAIN.  Dispense: 60 tablet; Refill: 3  5. Need for Tdap vaccination Health maintenance discussed with the patient and she is due for her Tdap vaccination which patient agreed to have done at today's visit.  Patient received vaccination along with informational handout regarding the immunization - Tdap (BOOSTRIX) injection 0.5 mL  6. Screening for colon  cancer Health maintenance issues discussed today's visit and patient was offered referral for screening colonoscopy which patient agreed to have.  Referral will be made to GI for screening colonoscopy - Ambulatory referral to Gastroenterology  An After Visit Summary was printed and given to the patient.  Return in about 6 months (around 05/31/2019) for HTN ; consider well female exam.

## 2018-12-01 LAB — BASIC METABOLIC PANEL WITH GFR
BUN/Creatinine Ratio: 16 (ref 9–23)
BUN: 12 mg/dL (ref 6–24)
CO2: 26 mmol/L (ref 20–29)
Calcium: 9.9 mg/dL (ref 8.7–10.2)
Chloride: 99 mmol/L (ref 96–106)
Creatinine, Ser: 0.73 mg/dL (ref 0.57–1.00)
GFR calc Af Amer: 107 mL/min/1.73
GFR calc non Af Amer: 93 mL/min/1.73
Glucose: 123 mg/dL — ABNORMAL HIGH (ref 65–99)
Potassium: 3.6 mmol/L (ref 3.5–5.2)
Sodium: 140 mmol/L (ref 134–144)

## 2018-12-06 ENCOUNTER — Telehealth: Payer: Self-pay | Admitting: *Deleted

## 2018-12-06 NOTE — Telephone Encounter (Signed)
Medical Assistant used [employer] to contact patient.  Interpreter Name: Ahmed Interpreter #: [phone] Patient verified DOB Patient is aware of labs being normal and to limit sugar intake. Patient states she DOES NOT need and interpreter and wanted the language changed to english.

## 2018-12-06 NOTE — Telephone Encounter (Signed)
-----   Message from Cain Saupe, MD sent at 12/02/2018 12:51 PM EST ----- Glucose was 123 and otherwise the BMP was normal

## 2018-12-07 ENCOUNTER — Ambulatory Visit (INDEPENDENT_AMBULATORY_CARE_PROVIDER_SITE_OTHER): Payer: Self-pay | Admitting: Orthopaedic Surgery

## 2018-12-10 ENCOUNTER — Encounter (INDEPENDENT_AMBULATORY_CARE_PROVIDER_SITE_OTHER): Payer: Self-pay | Admitting: Family Medicine

## 2018-12-10 ENCOUNTER — Ambulatory Visit (INDEPENDENT_AMBULATORY_CARE_PROVIDER_SITE_OTHER): Payer: BLUE CROSS/BLUE SHIELD | Admitting: Family Medicine

## 2018-12-10 DIAGNOSIS — M25561 Pain in right knee: Secondary | ICD-10-CM

## 2018-12-10 MED ORDER — METHYLPREDNISOLONE ACETATE 40 MG/ML IJ SUSP: 40.0000 mg | Freq: Once | INTRAMUSCULAR | Status: DC

## 2018-12-10 NOTE — Progress Notes (Signed)
   Office Visit Note   Patient: Crystal Whitaker           Date of Birth: 1966-04-26           MRN: [id number] Visit Date: 12/10/2018 Requested by: Cain Saupe, MD [address] [place], [place] [address] PCP: Cain Saupe, MD  Subjective: Chief Complaint  Patient presents with  . Right Knee - Pain    Chronic pain in right knee. No new injury. No swelling. Hurts to kneel to pray.    HPI: She is a 53 year old with right knee pain.  She is originally from [place].  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  She went to Dr. Thurston Hole who gave her a cortisone injection which helped for about 6 months, then he gave her another one which helped for about 5 years.  In the past month or 2 she has had increasing pain especially when kneeling.  Her PCP gave her anti-inflammatories which have not helped that much.  She wonders whether an injection might benefit her.              ROS: She has hypertension which has been well controlled.  Other systems were reviewed and are negative.  Objective: Vital Signs: LMP 06/04/2011   Physical Exam:  Right knee: 2+ patellofemoral crepitus, mild pain with patella compression.  1+ effusion with no warmth or erythema.  Tender on the medial and lateral joint lines, no palpable click with McMurray's.  Solid Lockman's.  Imaging: None today.  Old x-rays reviewed show mild to moderate arthritis.  Assessment & Plan: 1.  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  Follow-up as needed.     Procedures: Right knee injection: After sterile prep with Betadine, injected 3 cc 1% lidocaine without epinephrine and 40 mg methylprednisolone from superolateral approach, a flash of clear yellow synovial fluid was obtained prior to injection.   PMFS History: Patient Active Problem List   Diagnosis Date Noted  . Headache(784.0) 02/22/2014  . Knee pain, acute 04/21/2013  . HYPERTENSION, BENIGN  ESSENTIAL 12/15/2008  . DEGENERATIVE JOINT DISEASE, SHOULDER 12/15/2008  . LOW BACK PAIN SYNDROME 08/02/2007   Past Medical History:  Diagnosis Date  . Back pain   . Hypertension   . Sinusitis     Family History  Problem Relation Age of Onset  . Hypertension Mother   . Hypertension Sister   . Hypertension Brother     Past Surgical History:  Procedure Laterality Date  . CESAREAN SECTION     Social History   Occupational History  . Not on file  Tobacco Use  . Smoking status: Never Smoker  . Smokeless tobacco: Never Used  Substance and Sexual Activity  . Alcohol use: No  . Drug use: No  . Sexual activity: Not on file

## 2019-04-18 ENCOUNTER — Telehealth: Payer: Self-pay | Admitting: Family Medicine

## 2019-04-18 NOTE — Telephone Encounter (Signed)
New Message   Pt called in to see if we did Covid testing, informed pt that we do not but she can go to the old [facility] and provided address. Pt also has an appt with Dr. Chapman Fitch on 04/25/2019.

## 2019-04-18 NOTE — Telephone Encounter (Signed)
If she believes that she may have COVID then visit on 04/25/2019 may need to be done by video or telephone

## 2019-04-18 NOTE — Telephone Encounter (Signed)
lmom 

## 2019-04-20 NOTE — Telephone Encounter (Signed)
Called patient mobile number on file and message stated that call can not be completed at this time.

## 2019-04-25 ENCOUNTER — Encounter: Payer: Self-pay | Admitting: Family Medicine

## 2019-04-25 ENCOUNTER — Ambulatory Visit: Payer: Self-pay | Attending: Family Medicine | Admitting: Family Medicine

## 2019-04-25 ENCOUNTER — Ambulatory Visit: Payer: Self-pay | Admitting: *Deleted

## 2019-04-25 ENCOUNTER — Telehealth: Payer: Self-pay | Admitting: *Deleted

## 2019-04-25 ENCOUNTER — Other Ambulatory Visit: Payer: Self-pay

## 2019-04-25 DIAGNOSIS — R059 Cough, unspecified: Secondary | ICD-10-CM

## 2019-04-25 DIAGNOSIS — Z20828 Contact with and (suspected) exposure to other viral communicable diseases: Secondary | ICD-10-CM

## 2019-04-25 DIAGNOSIS — Z20822 Contact with and (suspected) exposure to covid-19: Secondary | ICD-10-CM

## 2019-04-25 DIAGNOSIS — R05 Cough: Secondary | ICD-10-CM

## 2019-04-25 DIAGNOSIS — M25562 Pain in left knee: Secondary | ICD-10-CM

## 2019-04-25 DIAGNOSIS — G8929 Other chronic pain: Secondary | ICD-10-CM

## 2019-04-25 MED ORDER — DICLOFENAC SODIUM 1 % TD GEL: 4.0000 g | Freq: Four times a day (QID) | TRANSDERMAL | 4 refills | Status: DC

## 2019-04-25 MED ORDER — NAPROXEN 500 MG PO TABS: ORAL_TABLET | ORAL | 3 refills | Status: DC

## 2019-04-25 NOTE — Progress Notes (Signed)
Left feet been hurting and left knee started 4 days ago.  Last week Friday she was coughing at he job and her manager asked her if she is okay and she asked the manager to go home. Per pt her job now wants her to get cleared by the doctor saying she do not have Covid 19

## 2019-04-25 NOTE — Telephone Encounter (Signed)
-----   Message from Antony Blackbird, MD sent at 04/25/2019 11:47 AM EDT ----- Regarding: covid-19 testing needed Please contact patient to set up testing for COVID-19 and she had a recent cough at work and was sent home until she can bring in proof of a negative COVID-19 test. Thank you

## 2019-04-25 NOTE — Telephone Encounter (Signed)
Patient is calling to scheduled COVID testing- she needs testing for work due to cough. Patient had E visit and she is calling to schedule her test. Order is in for testing

## 2019-04-25 NOTE — Telephone Encounter (Signed)
LM for pt to call back @ [phone] M-F 7a-7p to schedule covid testing. Order placed  

## 2019-04-25 NOTE — Progress Notes (Signed)
Virtual Visit via Telephone Note  I connected with Crystal Whitaker on 04/25/19 at 11:10 AM EDT by telephone and verified that I am speaking with the correct person using two identifiers.   I discussed the limitations, risks, security and privacy concerns of performing an evaluation and management service by telephone and the availability of in person appointments. I also discussed with the patient that there may be a patient responsible charge related to this service. The patient expressed understanding and agreed to proceed.  Patient Location: Home Provider Location: Office Others participating in call: call initiated by Emilio Aspen, RMA   History of Present Illness:      53 year old female with history of osteoarthritis of the knees who reports that she has had recent onset of pain in the left knee for the past 4 days.  She denies any trauma or injury to the knee.  She is currently out of medication for treatment of knee pain and would like a refill sent to her pharmacy.  Knee pain is about a 6 on a 0-to-10 scale.  No swelling of the knee.  Pain is dull and aching.        Patient also with complaint of coughing at work on June 26 and she reports that someone told her supervisor that she was coughing and her supervisor told the patient that she had to go home and could not return to work until she brought in a letter from her doctor stating that patient had a negative test for COVID-19.  Patient states that she tried to arrange to have testing done by registering online but could not get this done therefore she needs to be scheduled for COVID-19 test in order to return to work.  She denies any current issues with cough, no shortness of breath, no fever or chills, no headaches or dizziness, no sore throat and no loss of sense of taste and smell.  She does not believe that she has COVID-19 and has had no known exposure to anyone with COVID-19 however she cannot return to work unless she has proof  of a negative test.       Past Medical History:  Diagnosis Date  . Back pain   . Hypertension   . Sinusitis     Past Surgical History:  Procedure Laterality Date  . CESAREAN SECTION      Family History  Problem Relation Age of Onset  . Hypertension Mother   . Hypertension Sister   . Hypertension Brother     Social History   Tobacco Use  . Smoking status: Never Smoker  . Smokeless tobacco: Never Used  Substance Use Topics  . Alcohol use: No  . Drug use: No     No Known Allergies     Observations/Objective: No vital signs or physical exam conducted as visit was done via telephone  Assessment and Plan: 1. Chronic pain of left knee Refills provided of Voltaren gel and naproxen for as needed use secondary to left knee pain.  Patient has had x-ray of the right knee in the past showing degenerative changes but if she continues to have left knee pain she may need imaging as well as referral to a specialist however I suspect that her left knee pain is also secondary to osteoarthritis/degenerative changes.  2. Cough Order will be placed for COVID-19 testing.  Patient will be notified by community nursing program as to where and when she needs to have COVID-19 testing done.  Patient is  aware that the test will likely occur tomorrow and that it may take 2 to 3 days for the results to be received.  She denies any current symptoms of COVID-19 but cannot return to work without a negative test result to provide to her employer.  Follow Up Instructions:Return if symptoms worsen or fail to improve.    I discussed the assessment and treatment plan with the patient. The patient was provided an opportunity to ask questions and all were answered. The patient agreed with the plan and demonstrated an understanding of the instructions.   The patient was advised to call back or seek an in-person evaluation if the symptoms worsen or if the condition fails to improve as anticipated.  I provided  8 minutes of non-face-to-face time during this encounter.   Cain Saupeammie Toneisha Savary, MD

## 2019-04-26 ENCOUNTER — Other Ambulatory Visit: Payer: Self-pay

## 2019-04-26 DIAGNOSIS — Z20822 Contact with and (suspected) exposure to covid-19: Secondary | ICD-10-CM

## 2019-04-30 LAB — NOVEL CORONAVIRUS, NAA: SARS-CoV-2, NAA: NOT DETECTED

## 2019-06-20 ENCOUNTER — Other Ambulatory Visit: Payer: Self-pay | Admitting: Family Medicine

## 2019-06-20 DIAGNOSIS — E876 Hypokalemia: Secondary | ICD-10-CM

## 2019-06-20 DIAGNOSIS — I10 Essential (primary) hypertension: Secondary | ICD-10-CM

## 2019-07-27 ENCOUNTER — Other Ambulatory Visit: Payer: Self-pay

## 2019-07-27 ENCOUNTER — Ambulatory Visit: Payer: Self-pay | Attending: Family Medicine

## 2019-07-28 ENCOUNTER — Other Ambulatory Visit: Payer: Self-pay | Admitting: Family Medicine

## 2019-07-28 DIAGNOSIS — I10 Essential (primary) hypertension: Secondary | ICD-10-CM

## 2020-02-17 ENCOUNTER — Telehealth: Payer: Self-pay | Admitting: *Deleted

## 2020-02-17 ENCOUNTER — Other Ambulatory Visit: Payer: Self-pay | Admitting: Family Medicine

## 2020-02-17 ENCOUNTER — Other Ambulatory Visit: Payer: Self-pay

## 2020-02-17 ENCOUNTER — Ambulatory Visit (HOSPITAL_BASED_OUTPATIENT_CLINIC_OR_DEPARTMENT_OTHER): Payer: Self-pay | Admitting: Family Medicine

## 2020-02-17 DIAGNOSIS — E876 Hypokalemia: Secondary | ICD-10-CM

## 2020-02-17 DIAGNOSIS — I10 Essential (primary) hypertension: Secondary | ICD-10-CM

## 2020-02-17 MED ORDER — HYDROCHLOROTHIAZIDE 25 MG PO TABS: 25.0000 mg | ORAL_TABLET | Freq: Every day | ORAL | 0 refills | Status: DC

## 2020-02-17 MED ORDER — LISINOPRIL 20 MG PO TABS: 20.0000 mg | ORAL_TABLET | Freq: Every day | ORAL | 0 refills | Status: DC

## 2020-02-17 MED ORDER — POTASSIUM CHLORIDE ER 10 MEQ PO TBCR: EXTENDED_RELEASE_TABLET | ORAL | 0 refills | Status: DC

## 2020-02-17 NOTE — Telephone Encounter (Signed)
Patient is needing refills for her blood pressure medications and her potassium medicine

## 2020-02-17 NOTE — Telephone Encounter (Signed)
Refill sent to [facility]

## 2020-02-17 NOTE — Progress Notes (Signed)
Med refills script for BP and potassium

## 2020-02-17 NOTE — Progress Notes (Signed)
Patient ID: Crystal Whitaker, female   DOB: Mar 26, 1966, 54 y.o.   MRN: [id number]   Patient had to reschedule today's appointment and needs refill of blood pressure medicine and potassium.  She has rescheduled appointment for next week.

## 2020-02-19 NOTE — Progress Notes (Signed)
Patient ID: Crystal Whitaker, female   DOB: Mar 06, 1966, 54 y.o.   MRN: [id number]   Patient rescheduled visit for next week when she can come into the office.  Prescriptions were sent in for medication refills for 30 days.

## 2020-02-20 ENCOUNTER — Encounter: Payer: Self-pay | Admitting: Family Medicine

## 2020-02-20 ENCOUNTER — Ambulatory Visit: Payer: Self-pay | Attending: Family Medicine | Admitting: Family Medicine

## 2020-02-20 ENCOUNTER — Other Ambulatory Visit: Payer: Self-pay

## 2020-02-20 VITALS — BP 142/82 | HR 75 | Temp 98.1°F | Ht 60.0 in | Wt 122.2 lb

## 2020-02-20 DIAGNOSIS — Z13220 Encounter for screening for lipoid disorders: Secondary | ICD-10-CM

## 2020-02-20 DIAGNOSIS — Z79899 Other long term (current) drug therapy: Secondary | ICD-10-CM

## 2020-02-20 DIAGNOSIS — R739 Hyperglycemia, unspecified: Secondary | ICD-10-CM

## 2020-02-20 DIAGNOSIS — E876 Hypokalemia: Secondary | ICD-10-CM

## 2020-02-20 DIAGNOSIS — I10 Essential (primary) hypertension: Secondary | ICD-10-CM

## 2020-02-20 DIAGNOSIS — Z1211 Encounter for screening for malignant neoplasm of colon: Secondary | ICD-10-CM

## 2020-02-20 MED ORDER — POTASSIUM CHLORIDE ER 10 MEQ PO TBCR: EXTENDED_RELEASE_TABLET | ORAL | 1 refills | Status: DC

## 2020-02-20 MED ORDER — LISINOPRIL 20 MG PO TABS: 20.0000 mg | ORAL_TABLET | Freq: Every day | ORAL | 1 refills | Status: DC

## 2020-02-20 MED ORDER — HYDROCHLOROTHIAZIDE 25 MG PO TABS: 25.0000 mg | ORAL_TABLET | Freq: Every day | ORAL | 1 refills | Status: DC

## 2020-02-20 NOTE — Patient Instructions (Signed)

## 2020-02-20 NOTE — Progress Notes (Signed)
Established Patient Office Visit  Subjective:  Patient ID: Crystal Whitaker, female    DOB: 07-04-1966  Age: 54 y.o. MRN: [id number]  CC: BP follow-up   HPI Crystal Whitaker presents for follow-up of chronic medical problems including hypertension, history of hypokalemia and she has OA of the knees followed by Ortho- no current knee issues per patient. She has had elevated BS on prior labs.  She denies any issues with increased thirst or urinary frequency.  No current problems with knee pain.  She has had some occasional mild headaches but she is not sure they are related to her blood pressure.  She has not checked her home blood pressure for a while.  She does need refills of her medications and would like 90-day supply as she is returning to her home country of [place] later this month for a visit.  She will need to have a Covid test a few days prior to travel.  Overall she states she feels fine.  On review of health maintenance with the patient, she declines hepatitis C testing, would like to discuss mammogram and Pap at a later visit, she is willing to do fecal occult home blood testing in place of colonoscopy as a screening test for colon cancer at this time.  Past Medical History:  Diagnosis Date  . Back pain   . Hypertension   . Sinusitis     Past Surgical History:  Procedure Laterality Date  . CESAREAN SECTION      Family History  Problem Relation Age of Onset  . Hypertension Mother   . Hypertension Sister   . Hypertension Brother     Social History   Socioeconomic History  . Marital status: Married    Spouse name: Not on file  . Number of children: Not on file  . Years of education: Not on file  . Highest education level: Not on file  Occupational History  . Not on file  Tobacco Use  . Smoking status: Never Smoker  . Smokeless tobacco: Never Used  Substance and Sexual Activity  . Alcohol use: No  . Drug use: No  . Sexual activity: Not on file  Other Topics Concern    . Not on file  Social History Narrative  . Not on file   Social Determinants of Health   Financial Resource Strain:   . Difficulty of Paying Living Expenses:   Food Insecurity:   . Worried About Charity fundraiser in the Last Year:   . Arboriculturist in the Last Year:   Transportation Needs:   . Film/video editor (Medical):   Marland Kitchen Lack of Transportation (Non-Medical):   Physical Activity:   . Days of Exercise per Week:   . Minutes of Exercise per Session:   Stress:   . Feeling of Stress :   Social Connections:   . Frequency of Communication with Friends and Family:   . Frequency of Social Gatherings with Friends and Family:   . Attends Religious Services:   . Active Member of Clubs or Organizations:   . Attends Archivist Meetings:   Marland Kitchen Marital Status:   Intimate Partner Violence:   . Fear of Current or Ex-Partner:   . Emotionally Abused:   Marland Kitchen Physically Abused:   . Sexually Abused:     Outpatient Medications Prior to Visit  Medication Sig Dispense Refill  . acetaminophen (TYLENOL) 500 MG tablet Take 2 tablets (1,000 mg total) by mouth every 6 (six)  hours as needed for moderate pain. 30 tablet 0  . benzocaine (ORAJEL) 10 % mucosal gel Use as directed 1 application in the mouth or throat as needed for mouth pain. 5.3 g 0  . diclofenac sodium (VOLTAREN) 1 % GEL Apply 4 g topically 4 (four) times daily. As needed to relieve pain in left knee 150 g 4  . triamcinolone cream (KENALOG) 0.1 % Apply 1 application topically 2 (two) times daily. To area of skin rash 30 g 0  . hydrochlorothiazide (HYDRODIURIL) 25 MG tablet Take 1 tablet (25 mg total) by mouth daily. 90 tablet 0  . lisinopril (ZESTRIL) 20 MG tablet Take 1 tablet (20 mg total) by mouth daily. To lower blood pressure 90 tablet 0  . potassium chloride (KLOR-CON) 10 MEQ tablet TAKE 2 TABLETS(20 MEQ) BY MOUTH DAILY 180 tablet 0  . benzonatate (TESSALON) 100 MG capsule 1-2 tid prn cough (Patient not taking:  Reported on 11/30/2018) 21 capsule 0  . naproxen (NAPROSYN) 500 MG tablet TAKE ONE TABLET BY MOUTH TWICE A DAY  WITH MEAL AS NEEDED FOR PAIN. (Patient not taking: Reported on 02/20/2020) 60 tablet 3   Facility-Administered Medications Prior to Visit  Medication Dose Route Frequency Provider Last Rate Last Admin  . methylPREDNISolone acetate (DEPO-MEDROL) injection 40 mg  40 mg Intra-articular Once Hilts, Michael, MD        No Known Allergies  ROS Review of Systems  Constitutional: Negative for chills, fatigue and fever.  HENT: Negative for sore throat and trouble swallowing.   Respiratory: Negative for cough and shortness of breath.   Gastrointestinal: Negative for abdominal pain, blood in stool, constipation, diarrhea and nausea.  Endocrine: Negative for polydipsia, polyphagia and polyuria.  Genitourinary: Negative for dysuria and frequency.  Musculoskeletal: Negative for arthralgias and back pain.  Skin: Negative for rash and wound.  Neurological: Positive for headaches (Occasional, mild). Negative for dizziness.  Hematological: Negative for adenopathy. Does not bruise/bleed easily.      Objective:    Physical Exam  Constitutional: She is oriented to person, place, and time. She appears well-developed and well-nourished.  Well-nourished well-developed small framed older female in no acute distress  Neck: No JVD present.  Cardiovascular: Normal rate and regular rhythm.  Pulmonary/Chest: Effort normal and breath sounds normal.  Abdominal: Soft. There is no abdominal tenderness. There is no rebound and no guarding.  Musculoskeletal:        General: No tenderness or edema.  Lymphadenopathy:    She has no cervical adenopathy.  Neurological: She is alert and oriented to person, place, and time.  Skin: Skin is warm and dry.  Psychiatric: She has a normal mood and affect. Her behavior is normal.  Nursing note and vitals reviewed.  BP (!) 142/82   Pulse 75   Temp 98.1 F (36.7 C)  (Oral)   Ht 5' (1.524 m)   Wt 122 lb 3.2 oz (55.4 kg)   LMP 06/04/2011   SpO2 98%   BMI 23.87 kg/m  BP (!) 142/82   Pulse 75   Temp 98.1 F (36.7 C) (Oral)   Ht 5' (1.524 m)   Wt 122 lb 3.2 oz (55.4 kg)   LMP 06/04/2011   SpO2 98%   BMI 23.87 kg/m  Repeat BP 142/82 Wt Readings from Last 3 Encounters:  02/20/20 122 lb 3.2 oz (55.4 kg)  11/30/18 133 lb (60.3 kg)  08/06/18 135 lb 6.4 oz (61.4 kg)     Health Maintenance Due  Topic Date  Due  . HIV Screening  Never done  . PAP SMEAR-Modifier  Never done  . MAMMOGRAM  Never done  . COLONOSCOPY  Never done      Lab Results  Component Value Date   TSH 0.790 02/22/2014   Lab Results  Component Value Date   WBC 14.4 (H) 02/28/2014   HGB 11.6 (L) 02/28/2014   HCT 35.2 (L) 02/28/2014   MCV 82.1 02/28/2014   PLT 247 02/28/2014   Lab Results  Component Value Date   NA 140 11/30/2018   K 3.6 11/30/2018   CO2 26 11/30/2018   GLUCOSE 123 (H) 11/30/2018   BUN 12 11/30/2018   CREATININE 0.73 11/30/2018   BILITOT <0.2 04/15/2018   ALKPHOS 80 04/15/2018   AST 12 04/15/2018   ALT 11 04/15/2018   PROT 7.1 04/15/2018   ALBUMIN 4.3 04/15/2018   CALCIUM 9.9 11/30/2018   Lab Results  Component Value Date   CHOL 207 (H) 10/23/2016   Lab Results  Component Value Date   HDL 52 10/23/2016   Lab Results  Component Value Date   LDLCALC 126 (H) 10/23/2016   Lab Results  Component Value Date   TRIG 145 10/23/2016   Lab Results  Component Value Date   CHOLHDL 4.0 10/23/2016   Lab Results  Component Value Date   HGBA1C 5.4 08/06/2018   HGBA1C 5.4 08/06/2018   HGBA1C 5.4 (A) 08/06/2018   HGBA1C 5.4 08/06/2018      Assessment & Plan:  1. Essential hypertension Repeat blood pressure at 142/82.  Patient is to continue her current medications including lisinopril, hydrochlorothiazide and refill provided of potassium.  She will have recheck of electrolytes as part of comprehensive metabolic panel and will also have  lipid panel at today's visit.  Continue low-sodium diet and monitor blood pressure periodically. - Lipid panel - potassium chloride (KLOR-CON) 10 MEQ tablet; TAKE 2 TABLETS(20 MEQ) BY MOUTH DAILY  Dispense: 180 tablet; Refill: 1 - lisinopril (ZESTRIL) 20 MG tablet; Take 1 tablet (20 mg total) by mouth daily. To lower blood pressure  Dispense: 90 tablet; Refill: 1 - hydrochlorothiazide (HYDRODIURIL) 25 MG tablet; Take 1 tablet (25 mg total) by mouth daily.  Dispense: 90 tablet; Refill: 1  2. Hypokalemia We will check potassium level as part of comprehensive metabolic panel.  Refill provided of potassium.  - Comprehensive metabolic panel - potassium chloride (KLOR-CON) 10 MEQ tablet; TAKE 2 TABLETS(20 MEQ) BY MOUTH DAILY  Dispense: 180 tablet; Refill: 1  3. Elevated blood sugar Patient has had elevated blood sugar of 123 done on 11/30/2018 and has had no hemoglobin A1c since that time.  Hemoglobin A1c will be repeated at today's visit along with glucose level as part of comprehensive metabolic panel.  Patient is fasting at today's visit.  Handout on preventing type 2 diabetes provided as part of after visit summary. - Comprehensive metabolic panel - Hemoglobin A1c  4. Screening for colon cancer She agrees to do fecal occult blood test is screening test for colon cancer. -Fecal occult blood, immunochemical  5. Encounter for long-term (current) use of medications Comprehensive metabolic panel in follow-up of long-term use of medications for treatment of hypertension and to check on liver enzymes as patient will have lipid panel at today's visit and will be notified if statin therapy is needed based on the results - Comprehensive metabolic panel  6. Screening for lipid disorders Lipid panel will be done as a screening test for lipid disorders and as patient also  with hypertension.  Patient reports that she is fasting when she was asked if she had eaten this morning however as this is also the  month of Ramadan, I forgot to clarify what time patient last ate. - Lipid panel  -Patient will be traveling later this month and will need to return for lab visit for Covid testing and she needs to have this done preflight, she will need to have rapid testing so that she has the results available within 24 hours   Meds ordered this encounter  Medications  . potassium chloride (KLOR-CON) 10 MEQ tablet    Sig: TAKE 2 TABLETS(20 MEQ) BY MOUTH DAILY    Dispense:  180 tablet    Refill:  1  . lisinopril (ZESTRIL) 20 MG tablet    Sig: Take 1 tablet (20 mg total) by mouth daily. To lower blood pressure    Dispense:  90 tablet    Refill:  1    Patient is supposed to be on this medication-has she been taking?  . hydrochlorothiazide (HYDRODIURIL) 25 MG tablet    Sig: Take 1 tablet (25 mg total) by mouth daily.    Dispense:  90 tablet    Refill:  1   An After Visit Summary was printed and given to the patient.  Follow-up: Return in about 6 months (around 08/22/2020) for chronic issues-sooner if needed; needs lab/nurse visit for COVID-19 test later this month.    Cain Saupe, MD

## 2020-02-20 NOTE — Progress Notes (Signed)
Med refills for all her meds  Per pt sometimes her bones hurt  142/82

## 2020-02-21 LAB — LIPID PANEL
Chol/HDL Ratio: 4.3 ratio (ref 0.0–4.4)
Cholesterol, Total: 217 mg/dL — ABNORMAL HIGH (ref 100–199)
HDL: 51 mg/dL
LDL Chol Calc (NIH): 129 mg/dL — ABNORMAL HIGH (ref 0–99)
Triglycerides: 209 mg/dL — ABNORMAL HIGH (ref 0–149)
VLDL Cholesterol Cal: 37 mg/dL (ref 5–40)

## 2020-02-21 LAB — HEMOGLOBIN A1C
Est. average glucose Bld gHb Est-mCnc: 120 mg/dL
Hgb A1c MFr Bld: 5.8 % — ABNORMAL HIGH (ref 4.8–5.6)

## 2020-02-22 ENCOUNTER — Ambulatory Visit: Payer: Self-pay | Attending: Family Medicine

## 2020-02-22 ENCOUNTER — Other Ambulatory Visit: Payer: Self-pay

## 2020-02-22 NOTE — Telephone Encounter (Signed)
LMOM informing patient that refills she requested was sent to pharmacy off of [address].

## 2020-02-24 LAB — COMPREHENSIVE METABOLIC PANEL WITH GFR
ALT: 10 IU/L (ref 0–32)
AST: 14 IU/L (ref 0–40)
Albumin/Globulin Ratio: 1.6 (ref 1.2–2.2)
Albumin: 4.2 g/dL (ref 3.8–4.9)
Alkaline Phosphatase: 71 IU/L (ref 39–117)
BUN/Creatinine Ratio: 17 (ref 9–23)
BUN: 9 mg/dL (ref 6–24)
Bilirubin Total: <0.2 mg/dL (ref 0.0–1.2)
CO2: 24 mmol/L (ref 20–29)
Calcium: 9.7 mg/dL (ref 8.7–10.2)
Chloride: 101 mmol/L (ref 96–106)
Creatinine, Ser: 0.54 mg/dL — ABNORMAL LOW (ref 0.57–1.00)
GFR calc Af Amer: 122 mL/min/1.73
GFR calc non Af Amer: 106 mL/min/1.73
Globulin, Total: 2.6 g/dL (ref 1.5–4.5)
Glucose: 107 mg/dL — ABNORMAL HIGH (ref 65–99)
Potassium: 4.2 mmol/L (ref 3.5–5.2)
Sodium: 139 mmol/L (ref 134–144)
Total Protein: 6.8 g/dL (ref 6.0–8.5)

## 2020-02-24 LAB — SPECIMEN STATUS REPORT

## 2020-02-24 LAB — FECAL OCCULT BLOOD, IMMUNOCHEMICAL: Fecal Occult Bld: NEGATIVE

## 2020-03-05 ENCOUNTER — Other Ambulatory Visit: Payer: Self-pay | Admitting: Family Medicine

## 2020-03-05 ENCOUNTER — Ambulatory Visit: Payer: Self-pay

## 2020-03-05 DIAGNOSIS — E782 Mixed hyperlipidemia: Secondary | ICD-10-CM

## 2020-03-05 DIAGNOSIS — Z79899 Other long term (current) drug therapy: Secondary | ICD-10-CM

## 2020-03-05 MED ORDER — ROSUVASTATIN CALCIUM 10 MG PO TABS: 10.0000 mg | ORAL_TABLET | Freq: Every day | ORAL | 1 refills | Status: DC

## 2020-03-12 ENCOUNTER — Ambulatory Visit: Payer: HRSA Program | Attending: Internal Medicine

## 2020-03-12 DIAGNOSIS — Z20822 Contact with and (suspected) exposure to covid-19: Secondary | ICD-10-CM | POA: Insufficient documentation

## 2020-03-13 ENCOUNTER — Ambulatory Visit: Payer: Self-pay

## 2020-03-13 LAB — SARS-COV-2, NAA 2 DAY TAT

## 2020-03-13 LAB — NOVEL CORONAVIRUS, NAA: SARS-CoV-2, NAA: NOT DETECTED

## 2020-03-14 ENCOUNTER — Ambulatory Visit: Payer: Self-pay | Attending: Internal Medicine

## 2020-03-14 DIAGNOSIS — Z20822 Contact with and (suspected) exposure to covid-19: Secondary | ICD-10-CM | POA: Insufficient documentation

## 2020-03-15 LAB — SARS-COV-2, NAA 2 DAY TAT

## 2020-03-15 LAB — NOVEL CORONAVIRUS, NAA: SARS-CoV-2, NAA: NOT DETECTED

## 2020-06-29 ENCOUNTER — Other Ambulatory Visit: Payer: Self-pay

## 2020-07-12 IMAGING — DX DG CHEST 2V
2 series · 2 of 2 positions shown · non-contrast
Comparison: 02/28/2014

CLINICAL DATA: Cough and fever

EXAM:
CHEST - 2 VIEW

[chest pa]
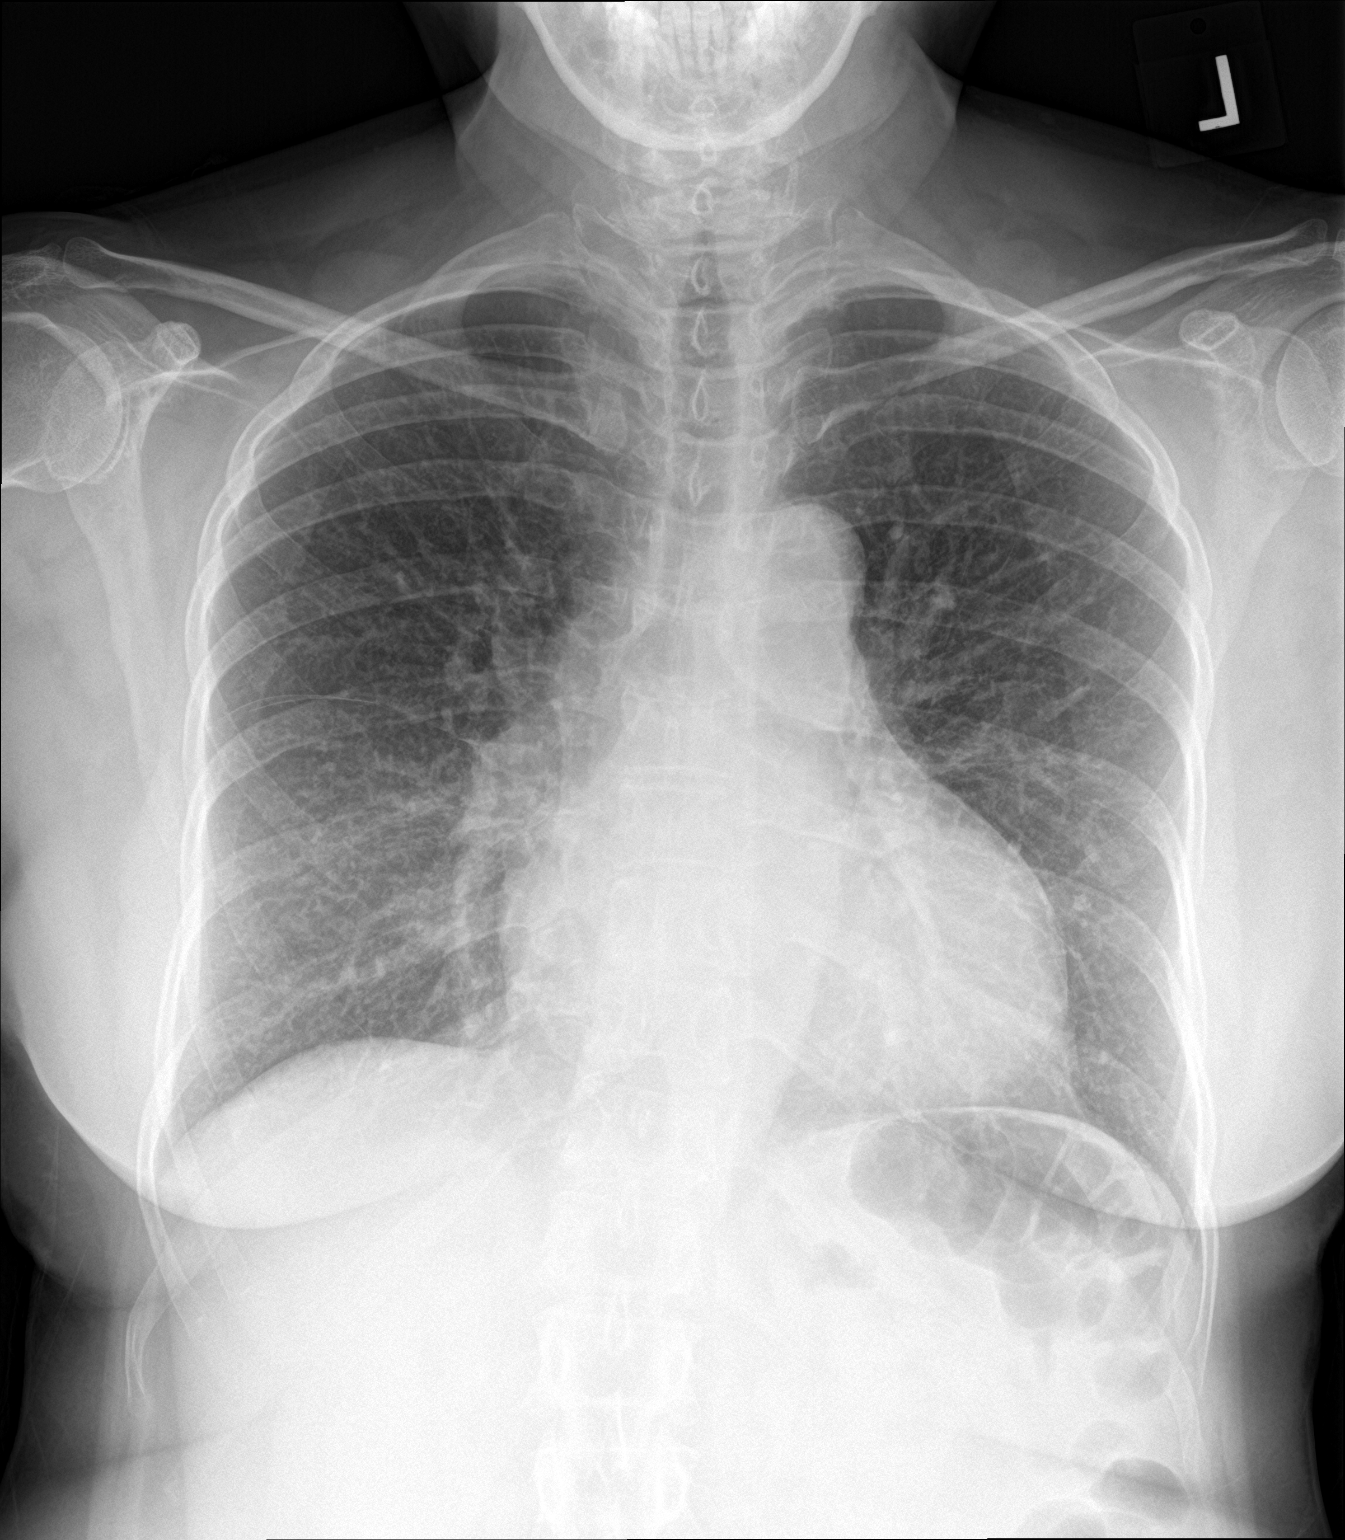

[chest lat]
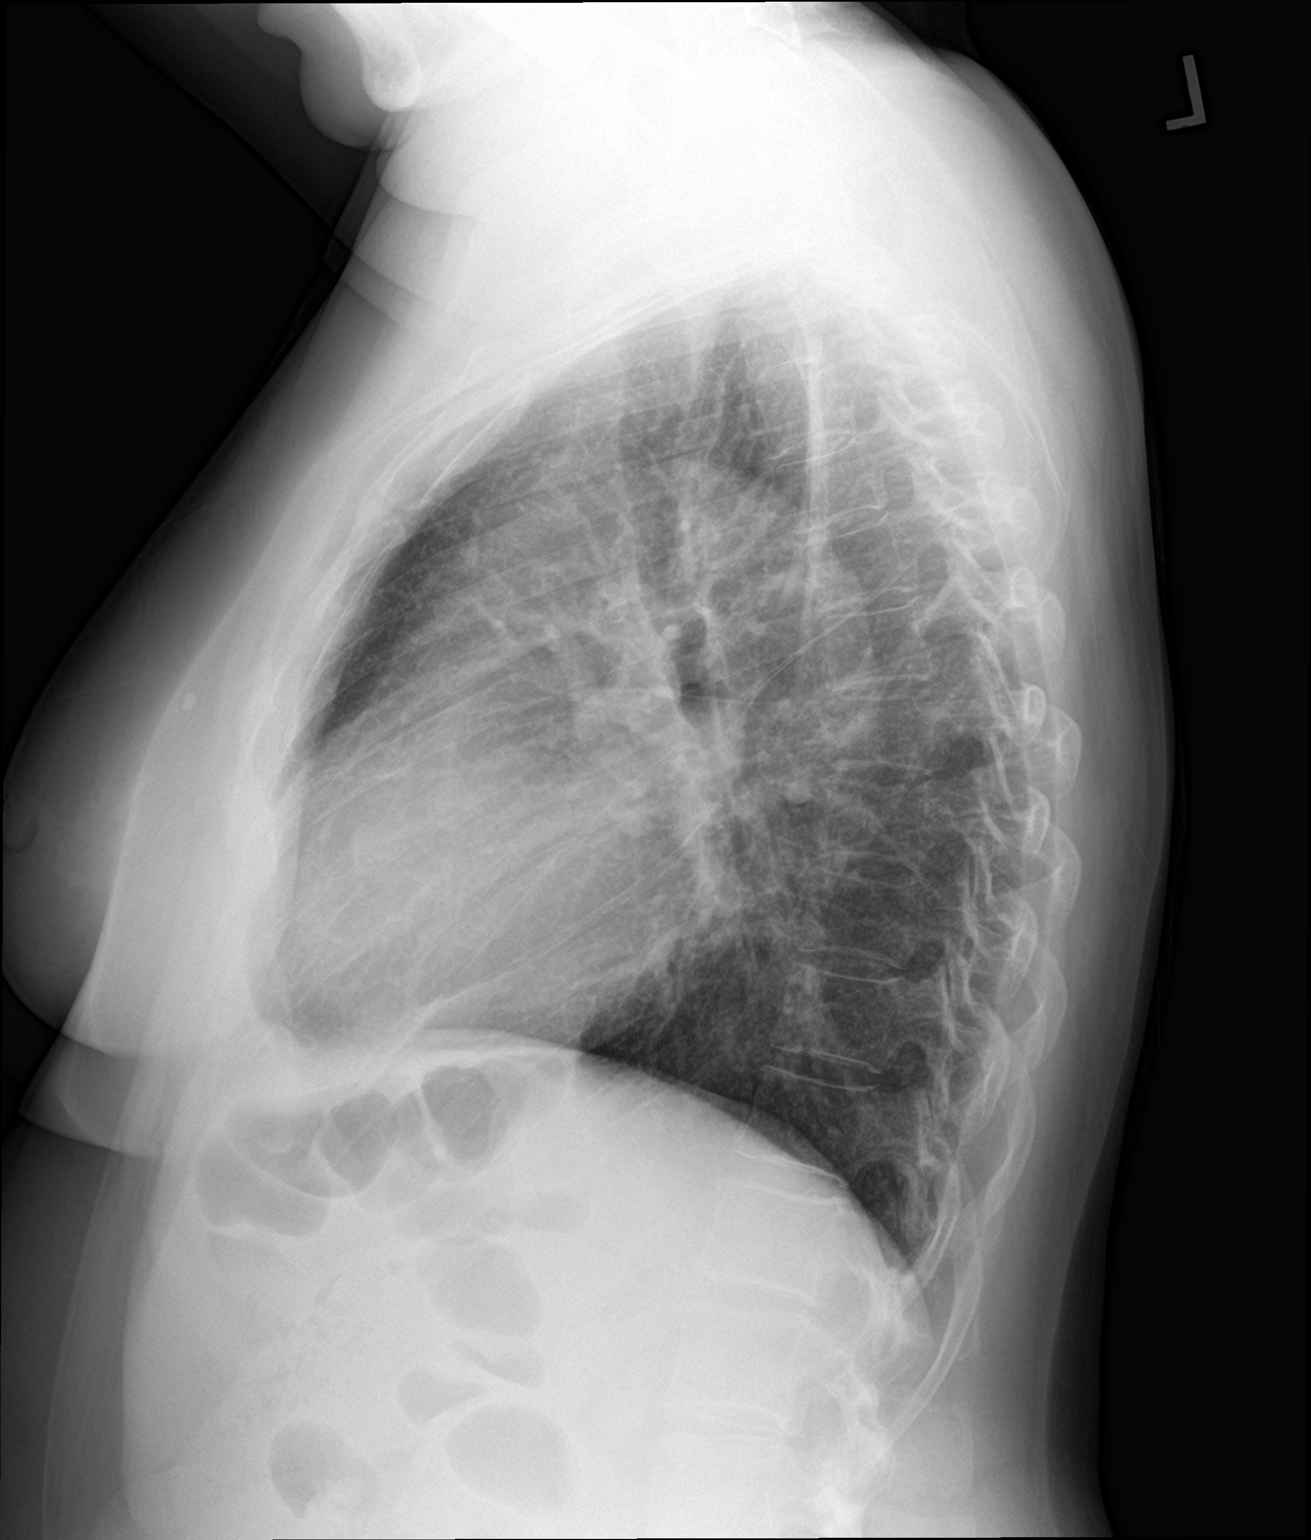

[2 of 2 positions shown; findings below may reference images not displayed]

FINDINGS: No acute consolidation or effusion. Borderline cardiomegaly with
central congestion. No pneumothorax.
IMPRESSION: 1. No focal pulmonary infiltrate
2. Borderline cardiomegaly.

## 2020-08-01 ENCOUNTER — Encounter (HOSPITAL_COMMUNITY): Payer: Self-pay | Admitting: Emergency Medicine

## 2020-08-01 ENCOUNTER — Other Ambulatory Visit: Payer: Self-pay

## 2020-08-01 ENCOUNTER — Ambulatory Visit (HOSPITAL_COMMUNITY)
Admission: EM | Admit: 2020-08-01 | Discharge: 2020-08-01 | Disposition: A | Discharge status: Home / Self Care | Payer: Self-pay | Attending: Internal Medicine | Admitting: Internal Medicine

## 2020-08-01 ENCOUNTER — Ambulatory Visit (INDEPENDENT_AMBULATORY_CARE_PROVIDER_SITE_OTHER): Payer: Self-pay

## 2020-08-01 DIAGNOSIS — M79645 Pain in left finger(s): Secondary | ICD-10-CM

## 2020-08-01 DIAGNOSIS — M1812 Unilateral primary osteoarthritis of first carpometacarpal joint, left hand: Secondary | ICD-10-CM

## 2020-08-01 DIAGNOSIS — M25442 Effusion, left hand: Secondary | ICD-10-CM

## 2020-08-01 MED ORDER — IBUPROFEN 600 MG PO TABS: 600.0000 mg | ORAL_TABLET | Freq: Four times a day (QID) | ORAL | 0 refills | Status: DC | PRN

## 2020-08-01 NOTE — ED Provider Notes (Signed)
[facility]    CSN: [id number] Arrival date & time: 08/01/20  1150      History   Chief Complaint Chief Complaint  Patient presents with  . Finger Injury    HPI Crystal Whitaker is a 54 y.o. female comes to the urgent care with left arm pain and swelling of 4-week duration.  Patient had her finger against the post 2 weeks ago.  He had significant pain.  Over the course of the week it appeared to recur more tolerable.  She had worsening of the pain today when she tried to use a pair scissors.  Pain is sharp, constant, aggravated by movement, no known relieving factors and associated with swelling of the left thumb.  HPI  Past Medical History:  Diagnosis Date  . Back pain   . Hypertension   . Sinusitis     Patient Active Problem List   Diagnosis Date Noted  . Headache(784.0) 02/22/2014  . Knee pain, acute 04/21/2013  . HYPERTENSION, BENIGN ESSENTIAL 12/15/2008  . DEGENERATIVE JOINT DISEASE, SHOULDER 12/15/2008  . LOW BACK PAIN SYNDROME 08/02/2007    Past Surgical History:  Procedure Laterality Date  . CESAREAN SECTION      OB History   No obstetric history on file.      Home Medications    Prior to Admission medications   Medication Sig Start Date End Date Taking? Authorizing Provider  acetaminophen (TYLENOL) 500 MG tablet Take 2 tablets (1,000 mg total) by mouth every 6 (six) hours as needed for moderate pain. 01/12/17   Lizbeth Bark, FNP  benzocaine (ORAJEL) 10 % mucosal gel Use as directed 1 application in the mouth or throat as needed for mouth pain. 01/12/17   Lizbeth Bark, FNP  diclofenac sodium (VOLTAREN) 1 % GEL Apply 4 g topically 4 (four) times daily. As needed to relieve pain in left knee 04/25/19   Fulp, Cammie, MD  hydrochlorothiazide (HYDRODIURIL) 25 MG tablet Take 1 tablet (25 mg total) by mouth daily. 02/20/20   Fulp, Cammie, MD  lisinopril (ZESTRIL) 20 MG tablet Take 1 tablet (20 mg total) by mouth daily. To  lower blood pressure 02/20/20   Fulp, Cammie, MD  potassium chloride (KLOR-CON) 10 MEQ tablet TAKE 2 TABLETS(20 MEQ) BY MOUTH DAILY 02/20/20   Fulp, Cammie, MD  rosuvastatin (CRESTOR) 10 MG tablet Take 1 tablet (10 mg total) by mouth daily. To lower cholesterol 03/05/20   Fulp, Cammie, MD  triamcinolone cream (KENALOG) 0.1 % Apply 1 application topically 2 (two) times daily. To area of skin rash 08/06/18   Cain Saupe, MD    Family History Family History  Problem Relation Age of Onset  . Hypertension Mother   . Hypertension Sister   . Hypertension Brother     Social History Social History   Tobacco Use  . Smoking status: Never Smoker  . Smokeless tobacco: Never Used  Substance Use Topics  . Alcohol use: No  . Drug use: No     Allergies   Patient has no known allergies.   Review of Systems Review of Systems  HENT: Negative.   Respiratory: Negative.   Cardiovascular: Negative.   Musculoskeletal: Positive for arthralgias and joint swelling. Negative for myalgias.  Skin: Negative.   Neurological: Negative.      Physical Exam Triage Vital Signs ED Triage Vitals  Enc Vitals Group     BP 08/01/20 1325 (!) 172/92     Pulse Rate 08/01/20 1325 75  Resp 08/01/20 1325 16     Temp 08/01/20 1325 98.2 F (36.8 C)     Temp Source 08/01/20 1325 Oral     SpO2 08/01/20 1325 97 %     Weight --      Height --      Head Circumference --      Peak Flow --      Pain Score 08/01/20 1324 7     Pain Loc --      Pain Edu? --      Excl. in GC? --    No data found.  Updated Vital Signs BP (!) 172/92 (BP Location: Right Arm)   Pulse 75   Temp 98.2 F (36.8 C) (Oral)   Resp 16   LMP 06/04/2011   SpO2 97%   Visual Acuity Right Eye Distance:   Left Eye Distance:   Bilateral Distance:    Right Eye Near:   Left Eye Near:    Bilateral Near:     Physical Exam Vitals and nursing note reviewed.  Constitutional:      General: She is not in acute distress.    Appearance: She  is not ill-appearing.  Cardiovascular:     Rate and Rhythm: Normal rate and regular rhythm.  Pulmonary:     Effort: Pulmonary effort is normal.     Breath sounds: Normal breath sounds.  Musculoskeletal:     Comments: Range of motion of the left first MCP is limited secondary to pain.  Swelling present.  No bruising.  Skin:    Capillary Refill: Capillary refill takes less than 2 seconds.  Neurological:     Mental Status: She is alert.      UC Treatments / Results  Labs (all labs ordered are listed, but only abnormal results are displayed) Labs Reviewed - No data to display  EKG   Radiology No results found.  Procedures Procedures (including critical care time)  Medications Ordered in UC Medications - No data to display  Initial Impression / Assessment and Plan / UC Course  I have reviewed the triage vital signs and the nursing notes.  Pertinent labs & imaging results that were available during my care of the patient were reviewed by me and considered in my medical decision making (see chart for details).     1.  Left thumb sprain: Ibuprofen 600 every 6 hours as needed for pain Thumb spica Gentle range of motion exercises Icing of the left thumb Return precautions given. Final Clinical Impressions(s) / UC Diagnoses   Final diagnoses:  None   Discharge Instructions   None    ED Prescriptions    None     PDMP not reviewed this encounter.   Merrilee Jansky, MD 08/02/20 1320

## 2020-08-01 NOTE — ED Triage Notes (Signed)
Pt presents with left thumb pain and swelling xs 1 wk. States hit finger at work.

## 2020-08-01 NOTE — Discharge Instructions (Addendum)
Gentle range of motion exercises Icing of the left arm. Take medications as prescribed.

## 2020-08-03 ENCOUNTER — Ambulatory Visit: Payer: Self-pay | Admitting: Family Medicine

## 2020-08-20 ENCOUNTER — Ambulatory Visit: Payer: Self-pay | Admitting: Family Medicine

## 2020-08-24 ENCOUNTER — Other Ambulatory Visit: Payer: Self-pay

## 2020-08-24 ENCOUNTER — Ambulatory Visit: Payer: Self-pay | Attending: Family Medicine | Admitting: Family Medicine

## 2020-08-24 ENCOUNTER — Encounter: Payer: Self-pay | Admitting: Family Medicine

## 2020-08-24 VITALS — BP 143/82 | HR 67 | Ht <= 58 in | Wt 121.4 lb

## 2020-08-24 DIAGNOSIS — I10 Essential (primary) hypertension: Secondary | ICD-10-CM

## 2020-08-24 DIAGNOSIS — M1812 Unilateral primary osteoarthritis of first carpometacarpal joint, left hand: Secondary | ICD-10-CM

## 2020-08-24 DIAGNOSIS — E876 Hypokalemia: Secondary | ICD-10-CM

## 2020-08-24 DIAGNOSIS — Z79899 Other long term (current) drug therapy: Secondary | ICD-10-CM

## 2020-08-24 DIAGNOSIS — R7303 Prediabetes: Secondary | ICD-10-CM

## 2020-08-24 DIAGNOSIS — E782 Mixed hyperlipidemia: Secondary | ICD-10-CM

## 2020-08-24 LAB — POCT GLYCOSYLATED HEMOGLOBIN (HGB A1C): Hemoglobin A1C: 5.4 % (ref 4.0–5.6)

## 2020-08-24 MED ORDER — HYDROCHLOROTHIAZIDE 25 MG PO TABS: 25.0000 mg | ORAL_TABLET | Freq: Every day | ORAL | 1 refills | Status: DC

## 2020-08-24 MED ORDER — LISINOPRIL 20 MG PO TABS: 20.0000 mg | ORAL_TABLET | Freq: Every day | ORAL | 1 refills | Status: DC

## 2020-08-24 MED ORDER — POTASSIUM CHLORIDE ER 10 MEQ PO TBCR: EXTENDED_RELEASE_TABLET | ORAL | 1 refills | Status: DC

## 2020-08-24 NOTE — Progress Notes (Signed)
Established Patient Office Visit  Subjective:  Patient ID: Crystal Whitaker, female    DOB: 04/14/1966  Age: 54 y.o. MRN: [id number]  CC:  Chief Complaint  Patient presents with  . Follow-up    UC 10/13    HPI Crystal Whitaker, 54 yo female who is seen in follow-up of chronic medical issues and is status post urgent care visit on 08/01/2020 due to pain and swelling in her left thumb. Patient is left handed and her job requires her to lift objects all day. She can lift the smaller boxes but cannot lift the larger ones as this causes her to have pain in her left thumb. Pain has improved since her urgent care visit but she still cannot fully bend her left thumb- left thumb does not touch the palm of her hand like the right thumb does.  She states that she has not yet returned to work because her workplace needs a note from her doctor stating that she can return without restrictions.  Patient however feels that she cannot lift the heavier boxes and she is not aware whether or not her workplace will allow her to have limits on the amount that she can lift.           She reports that she needs refills of her blood pressure medications as well as her potassium.  She has had a few occasional headaches when she was out of her blood pressure medication.  She denies any dizziness or lightheadedness.  She denies any muscle cramping related to low potassium.  She denies any chest pain or palpitations.  She reports that she is not currently taking her cholesterol medication.  She denies any increased thirst, blurred vision or frequent urination related to prior diagnosis of prediabetes.  Past Medical History:  Diagnosis Date  . Back pain   . Hypertension   . Sinusitis     Past Surgical History:  Procedure Laterality Date  . CESAREAN SECTION      Family History  Problem Relation Age of Onset  . Hypertension Mother   . Hypertension Sister   . Hypertension Brother      Social History   Socioeconomic History  . Marital status: Married    Spouse name: Not on file  . Number of children: Not on file  . Years of education: Not on file  . Highest education level: Not on file  Occupational History  . Not on file  Tobacco Use  . Smoking status: Never Smoker  . Smokeless tobacco: Never Used  Substance and Sexual Activity  . Alcohol use: No  . Drug use: No  . Sexual activity: Not on file  Other Topics Concern  . Not on file  Social History Narrative  . Not on file   Social Determinants of Health   Financial Resource Strain:   . Difficulty of Paying Living Expenses: Not on file  Food Insecurity:   . Worried About Programme researcher, broadcasting/film/videounning Out of Food in the Last Year: Not on file  . Ran Out of Food in the Last Year: Not on file  Transportation Needs:   . Lack of Transportation (Medical): Not on file  . Lack of Transportation (Non-Medical): Not on file  Physical Activity:   . Days of Exercise per Week: Not on file  . Minutes of Exercise per Session: Not on file  Stress:   . Feeling of Stress : Not on file  Social Connections:   . Frequency of  Communication with Friends and Family: Not on file  . Frequency of Social Gatherings with Friends and Family: Not on file  . Attends Religious Services: Not on file  . Active Member of Clubs or Organizations: Not on file  . Attends Banker Meetings: Not on file  . Marital Status: Not on file  Intimate Partner Violence:   . Fear of Current or Ex-Partner: Not on file  . Emotionally Abused: Not on file  . Physically Abused: Not on file  . Sexually Abused: Not on file    Outpatient Medications Prior to Visit  Medication Sig Dispense Refill  . acetaminophen (TYLENOL) 500 MG tablet Take 2 tablets (1,000 mg total) by mouth every 6 (six) hours as needed for moderate pain. 30 tablet 0  . benzocaine (ORAJEL) 10 % mucosal gel Use as directed 1 application in the mouth or throat as needed for mouth pain. 5.3 g 0   . diclofenac sodium (VOLTAREN) 1 % GEL Apply 4 g topically 4 (four) times daily. As needed to relieve pain in left knee 150 g 4  . hydrochlorothiazide (HYDRODIURIL) 25 MG tablet Take 1 tablet (25 mg total) by mouth daily. 90 tablet 1  . ibuprofen (ADVIL) 600 MG tablet Take 1 tablet (600 mg total) by mouth every 6 (six) hours as needed. 30 tablet 0  . lisinopril (ZESTRIL) 20 MG tablet Take 1 tablet (20 mg total) by mouth daily. To lower blood pressure 90 tablet 1  . potassium chloride (KLOR-CON) 10 MEQ tablet TAKE 2 TABLETS(20 MEQ) BY MOUTH DAILY 180 tablet 1  . triamcinolone cream (KENALOG) 0.1 % Apply 1 application topically 2 (two) times daily. To area of skin rash 30 g 0  . rosuvastatin (CRESTOR) 10 MG tablet Take 1 tablet (10 mg total) by mouth daily. To lower cholesterol (Patient not taking: Reported on 08/24/2020) 90 tablet 1   Facility-Administered Medications Prior to Visit  Medication Dose Route Frequency Provider Last Rate Last Admin  . methylPREDNISolone acetate (DEPO-MEDROL) injection 40 mg  40 mg Intra-articular Once Hilts, Michael, MD        No Known Allergies  ROS Review of Systems  Constitutional: Positive for fatigue (occasional). Negative for chills and fever.  HENT: Negative for sore throat and trouble swallowing.   Eyes: Negative for photophobia and visual disturbance.  Respiratory: Negative for cough and shortness of breath.   Cardiovascular: Negative for chest pain and palpitations.  Gastrointestinal: Negative for abdominal pain, constipation, diarrhea and nausea.  Endocrine: Negative for polydipsia, polyphagia and polyuria.  Genitourinary: Negative for dysuria and frequency.  Musculoskeletal: Positive for arthralgias. Negative for back pain.  Skin: Negative for rash and wound.  Neurological: Positive for headaches. Negative for dizziness.  Hematological: Negative for adenopathy. Does not bruise/bleed easily.  Psychiatric/Behavioral: Negative for suicidal ideas.  The patient is not nervous/anxious.       Objective:    Physical Exam Vitals and nursing note reviewed.  Constitutional:      Appearance: Normal appearance.     Comments: WNWD short statured older female in NAD  Neck:     Vascular: No carotid bruit.  Cardiovascular:     Rate and Rhythm: Normal rate and regular rhythm.  Pulmonary:     Effort: Pulmonary effort is normal.     Breath sounds: Normal breath sounds.  Abdominal:     General: Abdomen is flat.     Palpations: Abdomen is soft.     Tenderness: There is no abdominal tenderness. There is  no guarding or rebound.  Musculoskeletal:        General: Tenderness (patient with deformity at base on tle thumb and inability to fully flex the left thumb) present.     Cervical back: Normal range of motion and neck supple. No rigidity or tenderness.     Right lower leg: No edema.     Left lower leg: No edema.  Lymphadenopathy:     Cervical: No cervical adenopathy.  Skin:    General: Skin is warm and dry.  Neurological:     General: No focal deficit present.     Mental Status: She is alert and oriented to person, place, and time.  Psychiatric:        Mood and Affect: Mood normal.        Behavior: Behavior normal.     BP (!) 143/82 (BP Location: Left Arm, Patient Position: Sitting)   Pulse 67   Ht 4' 9.09" (1.45 m)   Wt 121 lb 6.4 oz (55.1 kg)   LMP 06/04/2011   SpO2 99%   BMI 26.19 kg/m  Wt Readings from Last 3 Encounters:  08/24/20 121 lb 6.4 oz (55.1 kg)  02/20/20 122 lb 3.2 oz (55.4 kg)  11/30/18 133 lb (60.3 kg)     Health Maintenance Due  Topic Date Due  . COVID-19 Vaccine (1) Never done  . HIV Screening  Never done  . PAP SMEAR-Modifier  Never done  . MAMMOGRAM  Never done  . COLONOSCOPY  Never done   Patient was offered but declined influenza immunization, she thinks that she will get this done in a few weeks but not today  Lab Results  Component Value Date   TSH 0.790 02/22/2014   Lab Results   Component Value Date   WBC 14.4 (H) 02/28/2014   HGB 11.6 (L) 02/28/2014   HCT 35.2 (L) 02/28/2014   MCV 82.1 02/28/2014   PLT 247 02/28/2014   Lab Results  Component Value Date   NA 139 02/20/2020   K 4.2 02/20/2020   CO2 24 02/20/2020   GLUCOSE 107 (H) 02/20/2020   BUN 9 02/20/2020   CREATININE 0.54 (L) 02/20/2020   BILITOT <0.2 02/20/2020   ALKPHOS 71 02/20/2020   AST 14 02/20/2020   ALT 10 02/20/2020   PROT 6.8 02/20/2020   ALBUMIN 4.2 02/20/2020   CALCIUM 9.7 02/20/2020   Lab Results  Component Value Date   CHOL 217 (H) 02/20/2020   Lab Results  Component Value Date   HDL 51 02/20/2020   Lab Results  Component Value Date   LDLCALC 129 (H) 02/20/2020   Lab Results  Component Value Date   TRIG 209 (H) 02/20/2020   Lab Results  Component Value Date   CHOLHDL 4.3 02/20/2020   Lab Results  Component Value Date   HGBA1C 5.8 (H) 02/20/2020      Assessment & Plan:  1. Essential hypertension; 5. Hypokalemia Refills provided of patient's current medications and electrolytes will be checked as part of CMET as patient with a history of low potassium.  - lisinopril (ZESTRIL) 20 MG tablet; Take 1 tablet (20 mg total) by mouth daily. To lower blood pressure  Dispense: 90 tablet; Refill: 1 - hydrochlorothiazide (HYDRODIURIL) 25 MG tablet; Take 1 tablet (25 mg total) by mouth daily.  Dispense: 90 tablet; Refill: 1 - potassium chloride (KLOR-CON) 10 MEQ tablet; TAKE 2 TABLETS(20 MEQ) BY MOUTH DAILY  Dispense: 180 tablet; Refill: 1 - Comprehensive metabolic panel  2. Mixed  hyperlipidemia; 5. Long term use of medications Patient reports that she is not currently taking her rosuvastatin. Will obtain LFT's as she did not return for LFT's are the initial start of the medication. Will have CMA check with patient's pharmacy to see when the medication was last filled.  - Comprehensive metabolic panel  3. Prediabetes Patient has had a prior hemoglobin A1c of 5.8 on 02/20/2019  and her hemoglobin A1c will be obtained at today's visit.  Hemoglobin A1c at today's visit is improved at 5.3. - HgB A1c  4. Osteoarthritis of thumb, left Records and x-ray reports from patient's urgent care visit reviewed and she will be referred to Ortho for further evaluation and treatment.  Note was written for patient to return to work with request for patient to be allowed to left no more than 20 pounds.  Patient will return to contact the office if this is not acceptable to her employer. - Ambulatory referral to Hand Surgery  5. Hypokalemia Refill of potassium and will check potassium level as part of CMET.  - potassium chloride (KLOR-CON) 10 MEQ tablet; TAKE 2 TABLETS(20 MEQ) BY MOUTH DAILY  Dispense: 180 tablet; Refill: 1 - Comprehensive metabolic panel    Follow-up: Return in about 4 months (around 12/22/2020) for chronic issues/fasting labs; sooner if needed.     Cain Saupe, MD

## 2020-08-24 NOTE — Progress Notes (Signed)
OUT OF BP MEDS LISINOPRIL, HCTZ /POTASSIUM RX NEEDS REFILL UC NOTES-Musculoskeletal: Positive for arthralgias and joint swelling

## 2020-08-25 LAB — COMPREHENSIVE METABOLIC PANEL WITH GFR
ALT: 9 IU/L (ref 0–32)
AST: 14 IU/L (ref 0–40)
Albumin/Globulin Ratio: 1.8 (ref 1.2–2.2)
Albumin: 4.3 g/dL (ref 3.8–4.9)
Alkaline Phosphatase: 67 IU/L (ref 44–121)
BUN/Creatinine Ratio: 18 (ref 9–23)
BUN: 12 mg/dL (ref 6–24)
Bilirubin Total: 0.4 mg/dL (ref 0.0–1.2)
CO2: 28 mmol/L (ref 20–29)
Calcium: 9.5 mg/dL (ref 8.7–10.2)
Chloride: 102 mmol/L (ref 96–106)
Creatinine, Ser: 0.66 mg/dL (ref 0.57–1.00)
GFR calc Af Amer: 114 mL/min/1.73
GFR calc non Af Amer: 99 mL/min/1.73
Globulin, Total: 2.4 g/dL (ref 1.5–4.5)
Glucose: 92 mg/dL (ref 65–99)
Potassium: 3.3 mmol/L — ABNORMAL LOW (ref 3.5–5.2)
Sodium: 140 mmol/L (ref 134–144)
Total Protein: 6.7 g/dL (ref 6.0–8.5)

## 2021-02-03 ENCOUNTER — Other Ambulatory Visit: Payer: Self-pay

## 2021-02-03 ENCOUNTER — Ambulatory Visit (HOSPITAL_COMMUNITY)
Admission: EM | Admit: 2021-02-03 | Discharge: 2021-02-03 | Disposition: A | Discharge status: Home / Self Care | Payer: Self-pay | Attending: Emergency Medicine | Admitting: Emergency Medicine

## 2021-02-03 ENCOUNTER — Ambulatory Visit (INDEPENDENT_AMBULATORY_CARE_PROVIDER_SITE_OTHER): Payer: Self-pay

## 2021-02-03 ENCOUNTER — Encounter (HOSPITAL_COMMUNITY): Payer: Self-pay | Admitting: Emergency Medicine

## 2021-02-03 DIAGNOSIS — M79671 Pain in right foot: Secondary | ICD-10-CM

## 2021-02-03 DIAGNOSIS — I10 Essential (primary) hypertension: Secondary | ICD-10-CM

## 2021-02-03 DIAGNOSIS — R030 Elevated blood-pressure reading, without diagnosis of hypertension: Secondary | ICD-10-CM

## 2021-02-03 MED ORDER — MELOXICAM 7.5 MG PO TABS: 7.5000 mg | ORAL_TABLET | Freq: Every day | ORAL | 0 refills | Status: DC

## 2021-02-03 NOTE — Discharge Instructions (Addendum)
Use Dr. Margart Sickles shoe insert for plantar fasciitis.    [facility] ([facility] Podiatrist in [place], [place] COVID-19 info: [email or web address] Get online care: [email or web address] Address: [address] [place], [place], [place] [address] Phone: [phone] Appointments: [email or web address]   [facility], [place], [place] Doctor in [place], [place] Address: [address] [place], [place] [address] Phone: [phone]

## 2021-02-03 NOTE — ED Provider Notes (Signed)
[facility]   MRN: [id number] DOB: 13-May-1966  Subjective:   Mahathi Abdelaal Abdalla Chapdelaine is a 55 y.o. female presenting for several month history of persistent right heel pain, worse in the past few weeks. No falls, trauma, redness, open wounds.  Denies history of diabetes.  She does have a history of high blood pressure, states that she does take her medications consistently.  Has been using Tylenol for pain control.   Current Facility-Administered Medications:  .  methylPREDNISolone acetate (DEPO-MEDROL) injection 40 mg, 40 mg, Intra-articular, Once, Hilts, Michael, MD  Current Outpatient Medications:  .  hydrochlorothiazide (HYDRODIURIL) 25 MG tablet, Take 1 tablet (25 mg total) by mouth daily., Disp: 90 tablet, Rfl: 1 .  lisinopril (ZESTRIL) 20 MG tablet, Take 1 tablet (20 mg total) by mouth daily. To lower blood pressure, Disp: 90 tablet, Rfl: 1 .  potassium chloride (KLOR-CON) 10 MEQ tablet, TAKE 2 TABLETS(20 MEQ) BY MOUTH DAILY, Disp: 180 tablet, Rfl: 1 .  acetaminophen (TYLENOL) 500 MG tablet, Take 2 tablets (1,000 mg total) by mouth every 6 (six) hours as needed for moderate pain., Disp: 30 tablet, Rfl: 0 .  benzocaine (ORAJEL) 10 % mucosal gel, Use as directed 1 application in the mouth or throat as needed for mouth pain., Disp: 5.3 g, Rfl: 0 .  diclofenac sodium (VOLTAREN) 1 % GEL, Apply 4 g topically 4 (four) times daily. As needed to relieve pain in left knee, Disp: 150 g, Rfl: 4 .  ibuprofen (ADVIL) 600 MG tablet, Take 1 tablet (600 mg total) by mouth every 6 (six) hours as needed., Disp: 30 tablet, Rfl: 0 .  rosuvastatin (CRESTOR) 10 MG tablet, Take 1 tablet (10 mg total) by mouth daily. To lower cholesterol (Patient not taking: No sig reported), Disp: 90 tablet, Rfl: 1 .  triamcinolone cream (KENALOG) 0.1 %, Apply 1 application topically 2 (two) times daily. To area of skin rash, Disp: 30 g, Rfl: 0   No Known Allergies  Past Medical History:   Diagnosis Date  . Back pain   . Hypertension   . Sinusitis      Past Surgical History:  Procedure Laterality Date  . CESAREAN SECTION      Family History  Problem Relation Age of Onset  . Hypertension Mother   . Hypertension Sister   . Hypertension Brother     Social History   Tobacco Use  . Smoking status: Never Smoker  . Smokeless tobacco: Never Used  Substance Use Topics  . Alcohol use: No  . Drug use: No    ROS   Objective:   Vitals: BP (!) 159/100   Pulse 75   Temp 98.6 F (37 C) (Oral)   Resp 16   LMP 06/04/2011   SpO2 97%   Physical Exam Constitutional:      General: She is not in acute distress.    Appearance: Normal appearance. She is well-developed. She is not ill-appearing, toxic-appearing or diaphoretic.  HENT:     Head: Normocephalic and atraumatic.     Nose: Nose normal.     Mouth/Throat:     Mouth: Mucous membranes are moist.     Pharynx: Oropharynx is clear.  Eyes:     General: No scleral icterus.       Right eye: No discharge.        Left eye: No discharge.     Extraocular Movements: Extraocular movements intact.     Conjunctiva/sclera: Conjunctivae normal.  Pupils: Pupils are equal, round, and reactive to light.  Cardiovascular:     Rate and Rhythm: Normal rate.  Pulmonary:     Effort: Pulmonary effort is normal.  Musculoskeletal:       Feet:  Skin:    General: Skin is warm and dry.  Neurological:     General: No focal deficit present.     Mental Status: She is alert and oriented to person, place, and time.     Motor: No weakness.     Coordination: Coordination normal.     Gait: Gait normal.     Deep Tendon Reflexes: Reflexes normal.  Psychiatric:        Mood and Affect: Mood normal.        Behavior: Behavior normal.        Thought Content: Thought content normal.        Judgment: Judgment normal.     Assessment and Plan :   PDMP not reviewed this encounter.  1. Foot pain, right   2. Essential hypertension    3. Elevated blood pressure reading     Radiology over-read pending. Suspect osteophytes about the talus inferiorly and posteriorly. Recommended conservative management with meloxicam, shoe inserts, icing after work shifts. Follow up with podiatry. Counseled patient on potential for adverse effects with medications prescribed/recommended today, ER and return-to-clinic precautions discussed, patient verbalized understanding.    Wallis Bamberg, PA-C 02/03/21 1430

## 2021-02-03 NOTE — ED Triage Notes (Signed)
Pain in right foot, started a few months ago, used to be intermittent, but is now constant. No injury. She was working for 5 months and standing at work made her pain worse. Pain is under right heel.

## 2021-02-12 ENCOUNTER — Ambulatory Visit: Payer: Self-pay | Admitting: Podiatry

## 2021-02-15 ENCOUNTER — Ambulatory Visit: Payer: Self-pay | Admitting: Internal Medicine

## 2021-02-25 ENCOUNTER — Encounter: Payer: Self-pay | Admitting: Internal Medicine

## 2021-02-25 ENCOUNTER — Ambulatory Visit: Payer: Self-pay | Attending: Internal Medicine | Admitting: Internal Medicine

## 2021-02-25 ENCOUNTER — Other Ambulatory Visit: Payer: Self-pay

## 2021-02-25 VITALS — BP 134/77 | HR 72 | Ht 60.0 in | Wt 119.2 lb

## 2021-02-25 DIAGNOSIS — Z1231 Encounter for screening mammogram for malignant neoplasm of breast: Secondary | ICD-10-CM

## 2021-02-25 DIAGNOSIS — E782 Mixed hyperlipidemia: Secondary | ICD-10-CM

## 2021-02-25 DIAGNOSIS — I10 Essential (primary) hypertension: Secondary | ICD-10-CM

## 2021-02-25 DIAGNOSIS — Z1211 Encounter for screening for malignant neoplasm of colon: Secondary | ICD-10-CM

## 2021-02-25 DIAGNOSIS — M545 Low back pain, unspecified: Secondary | ICD-10-CM

## 2021-02-25 DIAGNOSIS — G8929 Other chronic pain: Secondary | ICD-10-CM

## 2021-02-25 MED ORDER — POTASSIUM CHLORIDE ER 10 MEQ PO TBCR: EXTENDED_RELEASE_TABLET | ORAL | 1 refills | Status: DC

## 2021-02-25 MED ORDER — LISINOPRIL 20 MG PO TABS: 20.0000 mg | ORAL_TABLET | Freq: Every day | ORAL | 1 refills | Status: DC

## 2021-02-25 MED ORDER — HYDROCHLOROTHIAZIDE 25 MG PO TABS: 25.0000 mg | ORAL_TABLET | Freq: Every day | ORAL | 1 refills | Status: DC

## 2021-02-25 NOTE — Progress Notes (Addendum)
Patient ID: Crystal Whitaker, female    DOB: [birth year]  MRN: [id number]  CC: Back Pain   Subjective: Crystal Whitaker is a 55 y.o. female who presents for chronic disease management.  PCP was Dr. Jillyn Hidden who is no longer with the practice.Pt speaks some english Her concerns today include:  Patient with history of HTN, lower back pain syndrome, pre-DM (last A1C 08/2020 5.4), HL  HTN: reports compliance with Lisinopril and HCTZ and K+ supplement.  Did not take as yet today because she is fasting.  Limits salt in foods No CP/SOB/LE edema/HA/dizziness  HL: Crestor on med list but pt not taking it.  States pharmacy did not give her med in several mths.  Prefers cholesterol level checked to see if she still needs.   Reports chronic LBP that varies in intensity.  Across the lower back on both sides.  No radiation.  No numbness or tingling in the legs. Intermittent and has increase recently Worse when lifting something heavy. 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   Takes Ibuprofen sometimes, last took one 3-4 days ago.  HM:  Due for MMG, colon CA screen and PAP smear.  Patient Active Problem List   Diagnosis Date Noted  . Headache(784.0) 02/22/2014  . Knee pain, acute 04/21/2013  . HYPERTENSION, BENIGN ESSENTIAL 12/15/2008  . DEGENERATIVE JOINT DISEASE, SHOULDER 12/15/2008  . LOW BACK PAIN SYNDROME 08/02/2007     Current Outpatient Medications on File Prior to Visit  Medication Sig Dispense Refill  . acetaminophen (TYLENOL) 500 MG tablet Take 2 tablets (1,000 mg total) by mouth every 6 (six) hours as needed for moderate pain. 30 tablet 0  . ibuprofen (ADVIL) 600 MG tablet Take 1 tablet (600 mg total) by mouth every 6 (six) hours as needed. 30 tablet 0  . triamcinolone cream (KENALOG) 0.1 % Apply 1 application topically 2 (two) times daily. To area of skin rash 30 g 0  . diclofenac sodium (VOLTAREN) 1 % GEL Apply 4 g  topically 4 (four) times daily. As needed to relieve pain in left knee (Patient not taking: Reported on 02/25/2021) 150 g 4  . rosuvastatin (CRESTOR) 10 MG tablet Take 1 tablet (10 mg total) by mouth daily. To lower cholesterol (Patient not taking: No sig reported) 90 tablet 1   No current facility-administered medications on file prior to visit.    No Known Allergies  Social History   Socioeconomic History  . Marital status: Married    Spouse name: Not on file  . Number of children: Not on file  . Years of education: Not on file  . Highest education level: Not on file  Occupational History  . Not on file  Tobacco Use  . Smoking status: Never Smoker  . Smokeless tobacco: Never Used  Substance and Sexual Activity  . Alcohol use: No  . Drug use: No  . Sexual activity: Not on file  Other Topics Concern  . Not on file  Social History Narrative  . Not on file   Social Determinants of Health   Financial Resource Strain: Not on file  Food Insecurity: Not on file  Transportation Needs: Not on file  Physical Activity: Not on file  Stress: Not on file  Social Connections: Not on file  Intimate Partner Violence: Not on file    Family History  Problem Relation Age of Onset  . Hypertension Mother   . Hypertension  Sister   . Hypertension Brother     Past Surgical History:  Procedure Laterality Date  . CESAREAN SECTION      ROS: Review of Systems Negative except as stated above  PHYSICAL EXAM: BP 134/77   Pulse 72   Ht 5' (1.524 m)   Wt 119 lb 3.2 oz (54.1 kg)   LMP 06/04/2011   SpO2 98%   BMI 23.28 kg/m   Physical Exam  General appearance - alert, well appearing, and in no distress Mental status - normal mood, behavior, speech, dress, motor activity, and thought processes Neck - supple, no significant adenopathy Chest - clear to auscultation, no wheezes, rales or rhonchi, symmetric air entry Heart - normal rate, regular rhythm, normal S1, S2, 2/6 SEM at PMI and  along LSB Musculoskeletal -slight tenderness on palpation of the upper lumbar spine more so on the left side.  Straight leg raise negative.  Power in the lower extremities 5/5. Extremities -no lower extremity edema.   CMP Latest Ref Rng & Units 08/24/2020 02/20/2020 11/30/2018  Glucose 65 - 99 mg/dL 92 790(W) 409(B)  BUN 6 - 24 mg/dL 12 9 12   Creatinine 0.57 - 1.00 mg/dL 3.53) 2.99(M  Sodium 134 - 144 mmol/L 140 139 140  Potassium 3.5 - 5.2 mmol/L 3.3(L) 4.2 3.6  Chloride 96 - 106 mmol/L 102 101 99  CO2 20 - 29 mmol/L 28 24 26   Calcium 8.7 - 10.2 mg/dL 9.5 9.7 9.9  Total Protein 6.0 - 8.5 g/dL 6.7 6.8 -  Total Bilirubin 0.0 - 1.2 mg/dL 0.4 4.26 -  Alkaline Phos 44 - 121 IU/L 67 71 -  AST 0 - 40 IU/L 14 14 -  ALT 0 - 32 IU/L 9 10 -   Lipid Panel     Component Value Date/Time   CHOL 217 (H) 02/20/2020 0925   TRIG 209 (H) 02/20/2020 0925   HDL 51 02/20/2020 0925   CHOLHDL 4.3 02/20/2020 0925   CHOLHDL 4.0 10/23/2016 0954   VLDL 29 10/23/2016 0954   LDLCALC 129 (H) 02/20/2020 0925    CBC    Component Value Date/Time   WBC 14.4 (H) 02/28/2014 0848   RBC 4.29 02/28/2014 0848   HGB 11.6 (L) 02/28/2014 0848   HCT 35.2 (L) 02/28/2014 0848   PLT 247 02/28/2014 0848   MCV 82.1 02/28/2014 0848   MCH 27.0 02/28/2014 0848   MCHC 33.0 02/28/2014 0848   RDW 14.1 02/28/2014 0848   LYMPHSABS 0.8 02/28/2014 0848   MONOABS 0.8 02/28/2014 0848   EOSABS 0.0 02/28/2014 0848   BASOSABS 0.0 02/28/2014 0848    ASSESSMENT AND PLAN: 1. Essential hypertension Not at goal but patient has not taken medicines as yet for today.  Continue current medications and low-salt diet - CBC - lisinopril (ZESTRIL) 20 MG tablet; Take 1 tablet (20 mg total) by mouth daily. To lower blood pressure  Dispense: 90 tablet; Refill: 1 - hydrochlorothiazide (HYDRODIURIL) 25 MG tablet; Take 1 tablet (25 mg total) by mouth daily.  Dispense: 90 tablet; Refill: 1 - potassium chloride (KLOR-CON) 10 MEQ tablet; TAKE 2  TABLETS(20 MEQ) BY MOUTH DAILY  Dispense: 180 tablet; Refill: 1 - Comprehensive metabolic panel  2. Mixed hyperlipidemia We will recheck lipid profile.  If ASCVD score elevated, we will restart statin therapy - Lipid panel  3. Chronic bilateral low back pain without sciatica Likely arthritis or degenerative disc.  Worsened by the type of work she does.  Recommend continued use of ibuprofen  as needed since she uses it sparingly.  Also recommend getting heating pad to use in the evenings as needed.  Written information and demonstration given on back exercises.  4. Screening for colon cancer - Fecal occult blood, imunochemical([facility]/Sun[facility])  5. Encounter for screening mammogram for malignant neoplasm of breast - MM Digital Screening; Future     Patient was given the opportunity to ask questions.  Patient verbalized understanding of the plan and was able to repeat key elements of the plan.  [employer] Language interpreter used during this encounter. #[id number], Hussein  Orders Placed This Encounter  Procedures  . Fecal occult blood, imunochemical([facility])  . MM Digital Screening  . CBC  . Lipid panel  . Comprehensive metabolic panel     Requested Prescriptions   Signed Prescriptions Disp Refills  . lisinopril (ZESTRIL) 20 MG tablet 90 tablet 1    Sig: Take 1 tablet (20 mg total) by mouth daily. To lower blood pressure  . hydrochlorothiazide (HYDRODIURIL) 25 MG tablet 90 tablet 1    Sig: Take 1 tablet (25 mg total) by mouth daily.  . potassium chloride (KLOR-CON) 10 MEQ tablet 180 tablet 1    Sig: TAKE 2 TABLETS(20 MEQ) BY MOUTH DAILY    Return in about 6 weeks (around 04/08/2021) for PAP.  Jonah Blue, MD, FACP

## 2021-02-25 NOTE — Progress Notes (Signed)
Pain in feet and back. Needs refill on medication for out of town trip.

## 2021-02-25 NOTE — Patient Instructions (Signed)
Purchase a heating pad over-the-counter to use on your lower back as needed.   Back Exercises These exercises help to make your trunk and back strong. They also help to keep the lower back flexible. Doing these exercises can help to prevent back pain or lessen existing pain.  If you have back pain, try to do these exercises 2-3 times each day or as told by your doctor.  As you get better, do the exercises once each day. Repeat the exercises more often as told by your doctor.  To stop back pain from coming back, do the exercises once each day, or as told by your doctor. Exercises Single knee to chest Do these steps 3-5 times in a row for each leg: 1. Lie on your back on a firm bed or the floor with your legs stretched out. 2. Bring one knee to your chest. 3. Grab your knee or thigh with both hands and hold them it in place. 4. Pull on your knee until you feel a gentle stretch in your lower back or buttocks. 5. Keep doing the stretch for 10-30 seconds. 6. Slowly let go of your leg and straighten it. Pelvic tilt Do these steps 5-10 times in a row: 1. Lie on your back on a firm bed or the floor with your legs stretched out. 2. Bend your knees so they point up to the ceiling. Your feet should be flat on the floor. 3. Tighten your lower belly (abdomen) muscles to press your lower back against the floor. This will make your tailbone point up to the ceiling instead of pointing down to your feet or the floor. 4. Stay in this position for 5-10 seconds while you gently tighten your muscles and breathe evenly. Cat-cow Do these steps until your lower back bends more easily: 1. Get on your hands and knees on a firm surface. Keep your hands under your shoulders, and keep your knees under your hips. You may put padding under your knees. 2. Let your head hang down toward your chest. Tighten (contract) the muscles in your belly. Point your tailbone toward the floor so your lower back becomes rounded like  the back of a cat. 3. Stay in this position for 5 seconds. 4. Slowly lift your head. Let the muscles of your belly relax. Point your tailbone up toward the ceiling so your back forms a sagging arch like the back of a cow. 5. Stay in this position for 5 seconds.   Press-ups Do these steps 5-10 times in a row: 1. Lie on your belly (face-down) on the floor. 2. Place your hands near your head, about shoulder-width apart. 3. While you keep your back relaxed and keep your hips on the floor, slowly straighten your arms to raise the top half of your body and lift your shoulders. Do not use your back muscles. You may change where you place your hands in order to make yourself more comfortable. 4. Stay in this position for 5 seconds. 5. Slowly return to lying flat on the floor.   Bridges Do these steps 10 times in a row: 1. Lie on your back on a firm surface. 2. Bend your knees so they point up to the ceiling. Your feet should be flat on the floor. Your arms should be flat at your sides, next to your body. 3. Tighten your butt muscles and lift your butt off the floor until your waist is almost as high as your knees. If you do not feel  the muscles working in your butt and the back of your thighs, slide your feet 1-2 inches farther away from your butt. 4. Stay in this position for 3-5 seconds. 5. Slowly lower your butt to the floor, and let your butt muscles relax. If this exercise is too easy, try doing it with your arms crossed over your chest.   Belly crunches Do these steps 5-10 times in a row: 1. Lie on your back on a firm bed or the floor with your legs stretched out. 2. Bend your knees so they point up to the ceiling. Your feet should be flat on the floor. 3. Cross your arms over your chest. 4. Tip your chin a little bit toward your chest but do not bend your neck. 5. Tighten your belly muscles and slowly raise your chest just enough to lift your shoulder blades a tiny bit off of the floor. Avoid  raising your body higher than that, because it can put too much stress on your low back. 6. Slowly lower your chest and your head to the floor. Back lifts Do these steps 5-10 times in a row: 1. Lie on your belly (face-down) with your arms at your sides, and rest your forehead on the floor. 2. Tighten the muscles in your legs and your butt. 3. Slowly lift your chest off of the floor while you keep your hips on the floor. Keep the back of your head in line with the curve in your back. Look at the floor while you do this. 4. Stay in this position for 3-5 seconds. 5. Slowly lower your chest and your face to the floor. Contact a doctor if:  Your back pain gets a lot worse when you do an exercise.  Your back pain does not get better 2 hours after you exercise. If you have any of these problems, stop doing the exercises. Do not do them again unless your doctor says it is okay. Get help right away if:  You have sudden, very bad back pain. If this happens, stop doing the exercises. Do not do them again unless your doctor says it is okay. This information is not intended to replace advice given to you by your health care provider. Make sure you discuss any questions you have with your health care provider. Document Revised: 07/01/2018 Document Reviewed: 07/01/2018 Elsevier Patient Education  2021 [employer].

## 2021-02-26 LAB — LIPID PANEL
Chol/HDL Ratio: 3.6 ratio (ref 0.0–4.4)
Cholesterol, Total: 245 mg/dL — ABNORMAL HIGH (ref 100–199)
HDL: 68 mg/dL (ref >39)
LDL Chol Calc (NIH): 163 mg/dL — ABNORMAL HIGH (ref 0–99)
Triglycerides: 83 mg/dL (ref 0–149)
VLDL Cholesterol Cal: 14 mg/dL (ref 5–40)

## 2021-02-26 LAB — COMPREHENSIVE METABOLIC PANEL
ALT: 12 IU/L (ref 0–32)
AST: 15 IU/L (ref 0–40)
Albumin/Globulin Ratio: 1.8 (ref 1.2–2.2)
Albumin: 4.4 g/dL (ref 3.8–4.9)
Alkaline Phosphatase: 64 IU/L (ref 44–121)
BUN/Creatinine Ratio: 22 (ref 9–23)
BUN: 14 mg/dL (ref 6–24)
Bilirubin Total: 0.4 mg/dL (ref 0.0–1.2)
CO2: 25 mmol/L (ref 20–29)
Calcium: 9.6 mg/dL (ref 8.7–10.2)
Chloride: 102 mmol/L (ref 96–106)
Creatinine, Ser: 0.65 mg/dL (ref 0.57–1.00)
Globulin, Total: 2.4 g/dL (ref 1.5–4.5)
Glucose: 87 mg/dL (ref 65–99)
Potassium: 3.9 mmol/L (ref 3.5–5.2)
Sodium: 141 mmol/L (ref 134–144)
Total Protein: 6.8 g/dL (ref 6.0–8.5)
eGFR: 103 mL/min/{1.73_m2} (ref >59)

## 2021-02-26 LAB — CBC
Hematocrit: 37.3 % (ref 34.0–46.6)
Hemoglobin: 11.9 g/dL (ref 11.1–15.9)
MCH: 26.3 pg — ABNORMAL LOW (ref 26.6–33.0)
MCHC: 31.9 g/dL (ref 31.5–35.7)
MCV: 83 fL (ref 79–97)
Platelets: 240 10*3/uL (ref 150–450)
RBC: 4.52 x10E6/uL (ref 3.77–5.28)
RDW: 14.3 % (ref 11.7–15.4)
WBC: 4.7 10*3/uL (ref 3.4–10.8)

## 2021-02-27 ENCOUNTER — Other Ambulatory Visit: Payer: Self-pay | Admitting: Internal Medicine

## 2021-02-27 DIAGNOSIS — E782 Mixed hyperlipidemia: Secondary | ICD-10-CM

## 2021-02-27 DIAGNOSIS — Z79899 Other long term (current) drug therapy: Secondary | ICD-10-CM

## 2021-02-27 MED ORDER — ROSUVASTATIN CALCIUM 10 MG PO TABS: 10.0000 mg | ORAL_TABLET | Freq: Every day | ORAL | 3 refills | Status: DC

## 2021-03-15 ENCOUNTER — Other Ambulatory Visit: Payer: Self-pay

## 2021-08-06 ENCOUNTER — Encounter: Payer: Self-pay | Admitting: Physician Assistant

## 2021-08-06 ENCOUNTER — Other Ambulatory Visit: Payer: Self-pay

## 2021-08-06 ENCOUNTER — Ambulatory Visit: Payer: Self-pay | Attending: Physician Assistant | Admitting: Physician Assistant

## 2021-08-06 VITALS — BP 146/85 | HR 77 | Temp 98.3°F | Resp 18 | Ht 60.0 in | Wt 120.0 lb

## 2021-08-06 DIAGNOSIS — G8929 Other chronic pain: Secondary | ICD-10-CM

## 2021-08-06 DIAGNOSIS — M722 Plantar fascial fibromatosis: Secondary | ICD-10-CM

## 2021-08-06 DIAGNOSIS — I10 Essential (primary) hypertension: Secondary | ICD-10-CM

## 2021-08-06 DIAGNOSIS — E782 Mixed hyperlipidemia: Secondary | ICD-10-CM

## 2021-08-06 DIAGNOSIS — M545 Low back pain, unspecified: Secondary | ICD-10-CM

## 2021-08-06 MED ORDER — HYDROCHLOROTHIAZIDE 25 MG PO TABS: 25.0000 mg | ORAL_TABLET | Freq: Every day | ORAL | 1 refills | Status: DC

## 2021-08-06 MED ORDER — LISINOPRIL 20 MG PO TABS: 20.0000 mg | ORAL_TABLET | Freq: Every day | ORAL | 1 refills | Status: DC

## 2021-08-06 MED ORDER — POTASSIUM CHLORIDE ER 10 MEQ PO TBCR: EXTENDED_RELEASE_TABLET | ORAL | 1 refills | Status: DC

## 2021-08-06 NOTE — Progress Notes (Signed)
Patient has had tea with milk. Patient has not taken medication today states she forgot. Patient reports pain beginning at work and subsided with tylenol. Patient noticed pain in the right leg, middle of back and foot beginning last night described as aching and began while walking. Denies any injury.

## 2021-08-06 NOTE — Patient Instructions (Signed)
Please call to schedule your mammogram  Please start checking your blood pressure at home on a daily basis, keeping a written log and having that available for all your office visits.  Please return to the office if your blood pressure readings remain elevated.  Please start taking the cholesterol medication once daily and follow a low cholesterol diet  Roney Jaffe, PA-C Physician Assistant [facility] [email or web address]   ?????? ?????????? High Cholesterol ?????? ?????????? ?? ???? ?????? ????? ???? ?????? ????? ?????? ?????? (????????). ???? ???? ????. ???? ?????? ?? ?????????? ???? ?????? ?????. ?????? ??? ??????? ??? ????? ????? ?? ?????????? ???????? ?? ???? ???????. ??? ?????????? ?????? ?? ?????? ????? ????? ???? ?? ?????? ???? ???????. ???? ????? ?????????? ?? ????? ??? ???? ?????. ??? ??? ??? ????? ?? ?????????? ???????? ??? ?????? ???????? (????????) ??? ????? ??????? ???????. ????????? ?? ??????? ??????? ???? ???? ???? ?? ?????. ??? ???? ???????? ??? ??? ???????? ???????. ???? ?????? ?????????? ?? ????? ????? ????? ????? ????? ??????. ??? ????? ?? ??????? ??????? ?????? ??????? ?? ?????? ??? ??????? ?????????? ???? ?? ???? ???. ?? ????? ????? ?????? ???? ????? ??????? ??????? ?? ?????? ??????? ???? ??????: ????? ??????? ???? ????? ??? ???? ????? ?? ?????? ????????? (?????? ???????)?? ??????????. ????? ?????. ??? ?????? ?? ???? ?? ????????? ????????. ????? ???? ?? ??????? ?? ????? ?????? ?????????? (??? ????????? ???? ???????). ??????? ?????? ?????. ??????? ???? ??????. ?? ?????? ?????? ?? ???????? ?? ???? ???????? ?? ???? ????? ??? ??????? ??????? ???? ???????????. ?? ??????? ???????? ?? ????? ??????? ?????????? ???????? ???? ??: ?????? ????? ??? ????? (??????? ???????). ???? ????? ?? ?????? ??? ????? ?????? ?? ????? ????? (?????? ?? ??????). ??? ????? ??? ??????? ???? ????? ??? ?????? ?????? ???????? ??? ????? ???  ????. ???? ??? ???? ???? ?? 20 ?????? ??? ???? ????? ??????? ?????? ??????? ?????????? ???? ?? 4-6 ?????. ?? ???? ???? ?????? ???? ??? ??? ????? ?? ?????? ?????????? ?? ????? ??? ???? ???? ?????. ?????? ?????????? ?? ???? ????: ?????????? "?????"? ?? ????????? ???????? ??????????? ????? ???????? (LDL). ???? ????? ??????? ?? ?????????? ???? ????? ??? ?????. ???????? ???????? ?? ??? ?????? ?? 100 ???/??????? (2.59 ??????/???). ?????????? "??????"? ?? ????????? ???????? ??????????? ????????? ????????? (HDL). ?????? ???????? ??????????? ????????? ????????? (HDL) ?? ??????? ?? ????? ????? ?????? ???????? ???? ???????? ??????????? ????? ???????? (LDL) ??? ????? ????????. ???????? ???????? ???????? ??????????? ????????? ????????? (HDL) ??? ??? ?? 60 ???/??????? (1.55 ??????/???). ?????? ?????? ?????????. ???? ?? ?????? ???? ???? ????? ??????? ?? ????? ?????? ??? ??????. ???????? ???????? ?? ??? ?????? ?? 150 ???/??????? (1.69 ??????/???). ?????????? ?????. ???? ?????? ????? ?????????? ?? ????? ??? ?? ???? ????????? ???????? ??????????? ????? ???????? ?????????? ???????? ??????????? ????????? ????????? ?????? ?????????. ???????? ???????? ?? ??? ?????? ?? 200 ???/??????? (5.17 ??????/???). ??? ?????? ??? ??????? ???? ???? ?????? ???? ??????????? ?????? ????? ??????? ??? ?????? ??????? ??????????. ??????? ?? ?????? ???????. ?? ????? ??? ????? ????? ????? ??? ?????? ?? ??????? ??????? ??????? ????? ?? ????? ??????. ????? ??????? ?? ??? ??????. ??? ???? ??? ?????? ???????? ???????? ??????? ??????? ??? ??? ??? ?????? ?? ??????? ?????? ?????. ?????. ????? ????? ????? ?? ???? ??????? ?????? ??????? ???? ??????. ?? ???? ??? ???? ?????? ?? ???????? ?? ??? ??????? ?????????? ????. ???? ??? ????????? ?? ??????: ????? ??????  ????? ??? ????? ???? ????? ??? ???????. ???? ??? ??????? ???????: ????? ????? ?? ??????? ????????? ??????? ?? ??????? ?? ???????. ????? ????? ?? ??????? ??????? ?????? ????????. ??????? ??????  ?????? ??????? ????????. ????? ??????? ???????? ??? ?????? ???? ????? ??????? ?? ??????? ??????? ??????? ????? ?????. ?????? ?????? ?? ????????? ???? ??????? ??????? ???? ????? ??? ??????? ??????? ??????  3. ???? ????? ????? ????? ??? ????? ????????. ???? ??????? ?????????? ???? ????? ??? ????? ???????. ?????? ?????? ????? ?????? ??? ??????? ????? ???????? ?????? ?????? ???????? ??????? ???????? ??????. ???? ??? ????? ???????? ????? ??????. ??? ??? ?????? ????????? ????????: ??? ???? ??????? ???? ???????? ??? ????? ??????: ????? ???? ?????? ??? ?????? ?????? ??? ????????. ??????? ??? ???? ??????. ???? ???? ?????? ?? ?? ?????. ?? ???????? ???????? ????? ??????? ?????? ???? ??? 12 ????? (355 ??) ?? ?????? ?? ??? ??? 5 ?????? (148 ??) ?? ?????? ?? ??? ??? ????? ???? (44 ??) ?? ????????? ???????? ??????. ??? ??????  ??????? ???????? ???????? ???????. ???? ?????? ???????? ???? 150 ????? ???????? ?? ??????. ?? ????? ????? ??????? ?????? ??? ??????? ?????? ?????? ???????. ?? ?????? ?????? ????? ??? ????????? ?? ?????. ????? ??? ??????? ???? ????? ?????? ??????? ???????????? ??? ??????? ???????????. ????? ???? ??????? ?????? ??? ??? ????? ??? ???????? ??????? ?? ???????. ??????? ???? ????? ??????? ??? ??????? ???? ??????? ??????? ????? ????? ????? ???? ?? ??? ???? ????. ????? ????? ?????? ????????. ???? ??? ???. ????? ?????? ??? ?????? ?? ?????????: ????? ????? ????????? (American Heart Association):? [email or web address] ?????? ?????? ????? ?????? ????? (National Heart, Lung, and Blood Institute):? [email or web address] ???? ?????? ??????? ?????? ?? ??????? ???????: ?????? ????? ?? ????? ???? ????? ??? ?? ??? ??? ?? ?????? ????. ??? ?????? ?????? ??????. ??? ?????? ??? ??????? ?? ???????. ???? ???????? ??? ????? ?? ??????? ???????: ??????? ???? ?? ?????. ??? ????? ?? ????? ?? ??????. ?????? ???? ???? ?? ??? ?? ??? ?? ????? ?? ???? ?? ???? ?? ?????. ??????? ?????? ?????? ????????. ???????? "BE FAST" ?????  ???? ????? ??? ???????? ????????? ????? ???? ??????: B - ???????. ???????? ?? ?????? ?? ????? ?????? ?? ????? ?? ????? ?????? ??? ???????. E - ???????. ???????? ?? ????? ?? ?????? ?? ???? ????? ?? ??????. F - ?????. ???????? ?? ??? ????? ?? ????? ?? ????? ?? ???? ????? ?? ????? ??? ??? ????????. A - ????????. ???????? ?? ??? ?????? ?? ?????? ?????? ???. ???? ??? ????? ????? ????? ?? ??? ????? ?????. S - ??????. ???????? ?? ????? ?????? ?? ?????? ?? ????? ?????? ?? ????? ??? ?? ????? ???????. T - ?????. ??? ????? ??????? ????????. ???? ??? ????? ???????. ???? ?????? ???? ?????? ????????? ???: ??????? ????? ???? ????? ?? ??? ???? ??? ?????. ??????? ?? ??????. ???? ??????. ???? ???? ??? ??????? ??? ????? ????? ????? ???? ???? ?????. ??? ?? ????? ??? ?? ???? ???????. ?? ???? ???????? ?????? ??? ?????. ???? ?????? ??????? ??????? (911 ?? ???????? ??????? ?????????). ?? ??? ??????? ????? ??? ????????. ???? ???? ?????? ?????????? ?? ????? ????? ????? ????? ????? ??????. ??? ????? ?? ??????? ??????? ?????? ??????? ?? ?????? ??? ??????? ?????????? ???? ?? ???? ???. ????? ?????? ??????? ????? ????????? ????? ???????? ???????? ????? ??? ??? ???. ?? ?????? ?????? ????? ??? ????????? ?? ?????. ????? ??? ??????? ???? ????? ?????? ??????? ???????????? ??? ??????? ???????????. ???? ???????? ????? ??? ???? ???? ??????? ?? ????? ?????? ????????. ??? ????? ?? ??? ????????? ?? ???? ?????? ????????? ???? ?????? ???? ??????? ??????. ???? ?? ?????? ??? ????? ???? ?? ???? ?? ???? ??????? ??????.? Document Revised: 01/01/2021 Document Reviewed: 01/01/2021 Elsevier Patient Education  2022 [employer]  ?????? ??????? ???????? Plantar Fasciitis ?????? ??????? ???????? ????? ????? ????? ???? ????? ????? ??? ?????. ????? ??? ?????? ??????? ???? ??? ??? ????? ????? ????? ????? (??????? ????????). ????? ??? ?????? ????? ??????? ?? ?????? ????????? ?? ??? ?????? ????? ?????? (????? ????? ?????????). ?????? ???????  ???????? ?? ???? ???? ???? ???? ?? ?? ???? ????? ???? ?? ??? ??? ??? ???? ????? ???? ???? ????? ?? ??????. ????? ?? ???? ??? ??? ???? ?? ?????? ?? ?????? ??? ????????? ?? ??? ?????? ?? ????????? ?????. ?? ?????? ????? ????? ??? ????? ????? ?? ????? ?? ??????. ?? ????? ??? ??????? ???? ???? ??? ?????? ????? ??: ?????? ?????? ?????. ?????? ????? ?? ???? ??? ????? ???? ???. ?????? ????? ???? ??? ??????? (????? ????? ???????). ????? ??? ???????? ????????? ???? ???? ????? ????? ???? (?????? ?????????)? ??? ?????. ????? ?????. ????? ??? (???) ??? ??????. ???? ??????? ?? ????? ?????? ?? ???? ????? (??????). ?????? ????? ?????? ?? ???? ????? ?? ??????? ???????? ?????????. ????? ?? ?????? ???? ????? ????. ?? ?????? ??? ?????? ?? ???????? ?????? ?????? ???? ?????? ?? ??? ???? ???? ?????. ?? ???? ????? ??? ?? ???: ??? ????? ?????? ??? ???? ?? ??????? ??????? ?? ?????? ??? ????????? ?? ??? ?????? ?? ????????? ????? ?? ?????. ?????? ?????? ????? ??? ????. ?? ??? ????? ???  30-45 ????? ?? ?????? ??? ????? ??????. ??? ????? ??? ??????? ???? ????? ??? ?????? ??? ?????? ?????? ?????? ?????? ???????? ???? ????? ????. ??????? ???? ??????? ?????? ??????? ?? ????? ??? ???: ?? ??? ?? ???? ????? ???? ??? ????. ?????? ????? ?????? ?? ???? ???? ?? ??????? ???????? ?????????. ??? ?? ????? ????? ????. ????? ????? ?????. ??? ?? ???? ????? ????? ?????? ???????? ???: ?????? ???????. ??????? ??? ???????. ?????????? ?????????? ????????????? (MRI). ??? ?????? ??? ??????? ????? ???? ?????? ??????? ???????? ??? ??? ??? ??????. ???? ???? ???? ??????: ????????? ???? ????? ?????? ??? ???? ??????? ???? ????? ???????. ?????? ??? ?????? ???????? ????? RICE. ?? ???? ???? ??????? ?????? ?????? ???? RICE ?? ?????? ????? ???? ?? ?????? ???? ???? ???? ??????? ??? ?????. ?????? ????????? ???????? ?????? ???? ?????? ???????? ????????. ????? ????? ???? ???? ?? ??? ???? ?????? ????? ?? ????? ????? (????? ?????). ?????? ??????? ??????  ??????? ???????? ?? ???? ?????? ?? ????????. ??? ???? ??????? (??????????) ?????? ????? ?????????. ????? ??????? ???????? ??????? ???????? ???????? (?????? ???????? ????????? ????????). ????? ?? ???? ??? ?? ??? ???? ????? ??? ?????? ???????. ???????? ???? ?? ?? ???? ??? ?????? ?????? ??? ??? 12 ?????. ???? ??? ????????? ?? ??????: ??????? ??? ????? ??????? ???????  ?? ????? ??? ??????? ???????? ??? ?????? ????. ????? ??? ?????? ??????? ???????: ?? ????? ?? ??? ???????? ?? ?????? ????? ??? ????. ?? ????? ??? ???? ?????? ?? ???????. ??? ????? ?????? ?? ???? ??? ????? ?? ???????. ??? ??? 2-3 ???? ??????? ???? 20 ????? ?? ?? ???. ???? ??????? ???????? ??????? ?????? ?????? ??????? ?? ?????? (????) ?? ???? ??????? ?????? ??????? ?????? ??????? ???????? ??????? ??????? ????????. ???? ???? ???? ?? ????? ????? ?? ????? ?????? ?? ?????????. ?????? ???? ??????? ???? ???? ?????. ????? ???? ??????? ?????? ?? ??????? ?????? ??. ???? ?????? ?????? ??????? ?? ?????? ??????? ??? ??????? ??????? ??????? ??????. ??? ?????? ??????? ????????? ???? ?? ???? ??????? (??????? ??? ??????? ???????). ????? ??????? ??????? ????????? ???????? ??????? ????? ????????. ??????? ???? ????? ??????? ??? ?? ????? ?? ???? ??????? ??????? ????? ????? ????? ???? ?? ??? ???? ????. ???? ??????? ??????? ?? ????? ????? ?? ???? ??????? ??????? ?????? ????. ?? ??????? ?? ???? ???? ????? ????? ?? ?????? ?? ??? ???? ????? ?????? ????. ???? ??? ??? ??? ????? ?? ???? ?? ??????? ??????? ?????? ??????? ?? ?????? ???? ??? ??????. ????? ????? ?????? ????????. ???? ??? ???. ???? ???????? ??????? ?????? ?? ??????? ???????: ??????? ?????? ?? ???? ???????? ???????? ????????. ?????? ???? ??????. ???? ???? ?? ????? ??? ?????? ?? ?????? ?????? ??????? ?????????. ???? ?????? ??????? ???????? ????? ????? ????? ???? ????? ????? ??? ?????. ????? ??? ?????? ??????? ???? ??? ??? ????? ????? ????? ????? (??????? ????????). ??? ????? ?? ????? ?????? ????  ??????. ???? ????? ?? ???? ??? ??????? ?? ?????? ???????? ???????? ?? ?????? ??? ???? ????? ?????. ????? ?????? ?? ???? ?????? ????? ???? ???? ???????? ???? ?????? ??????? ???? ????? ???????. ?????? ??? ?????? ???????? ????? RICE. ???? ????? ??????? ??????? ???? ????? ??? ???? ???? ??????? ?? ?????. ??? ????? ?? ??? ????????? ?? ???? ?????? ????????? ???? ?????? ???? ??????? ??????. ???? ?? ?????? ??? ????? ???? ?? ???? ?? ???? ??????? ??????.? Document Revised: 04/20/2020 Document Reviewed: 04/20/2020 Elsevier Patient Education  2022 [employer].

## 2021-08-06 NOTE — Progress Notes (Signed)
Established Patient Office Visit  Subjective:  Patient ID: Crystal Whitaker, female    DOB: 18-Aug-1966  Age: 55 y.o. MRN: [id number]  CC:  Chief Complaint  Patient presents with   Medication Refill     HPI [name] states that she did not start the statin, states thtat she did not receive the message regarding her lab results.  States that she is not checking her BP at home, states that she is taking her medication as direced, but did forget to take it this morning.  States that she has been having pain in her lower back for the past two years, will use Tylenol with relief, but does endorse that the pain comes back easily.  Denies injury or trauma, denies radiation.  Denies numbness or tingling.  Does endorse that she works on her feet all day.  States that she has been having pain in the bottom of her right foot for the past month.  Does endorse that it is worse when she first wakes up and improves as the day goes on.  Reports that she does wear safety shoes at work and will wear sandals around the house.  Has not tried anything for relief.  Denies numbness or tingling  Due to language barrier, an interpreter was present during the history-taking and subsequent discussion (and for part of the physical exam) with this patient.   Past Medical History:  Diagnosis Date   Back pain    Hypertension    Sinusitis     Past Surgical History:  Procedure Laterality Date   CESAREAN SECTION      Family History  Problem Relation Age of Onset   Hypertension Mother    Hypertension Sister    Hypertension Brother     Social History   Socioeconomic History   Marital status: Married    Spouse name: Not on file   Number of children: Not on file   Years of education: Not on file   Highest education level: Not on file  Occupational History   Not on file  Tobacco Use   Smoking status: Never   Smokeless tobacco: Never  Substance and Sexual  Activity   Alcohol use: No   Drug use: No   Sexual activity: Not Currently  Other Topics Concern   Not on file  Social History Narrative   Not on file   Social Determinants of Health   Financial Resource Strain: Not on file  Food Insecurity: Not on file  Transportation Needs: Not on file  Physical Activity: Not on file  Stress: Not on file  Social Connections: Not on file  Intimate Partner Violence: Not on file    Outpatient Medications Prior to Visit  Medication Sig Dispense Refill   acetaminophen (TYLENOL) 500 MG tablet Take 2 tablets (1,000 mg total) by mouth every 6 (six) hours as needed for moderate pain. 30 tablet 0   ibuprofen (ADVIL) 600 MG tablet Take 1 tablet (600 mg total) by mouth every 6 (six) hours as needed. 30 tablet 0   rosuvastatin (CRESTOR) 10 MG tablet Take 1 tablet (10 mg total) by mouth daily. To lower cholesterol 30 tablet 3   triamcinolone cream (KENALOG) 0.1 % Apply 1 application topically 2 (two) times daily. To area of skin rash 30 g 0   hydrochlorothiazide (HYDRODIURIL) 25 MG tablet Take 1 tablet (25 mg total) by mouth daily. 90 tablet 1   lisinopril (ZESTRIL) 20 MG tablet Take 1 tablet (  20 mg total) by mouth daily. To lower blood pressure 90 tablet 1   potassium chloride (KLOR-CON) 10 MEQ tablet TAKE 2 TABLETS(20 MEQ) BY MOUTH DAILY 180 tablet 1   diclofenac sodium (VOLTAREN) 1 % GEL Apply 4 g topically 4 (four) times daily. As needed to relieve pain in left knee (Patient not taking: No sig reported) 150 g 4   No facility-administered medications prior to visit.    No Known Allergies  ROS Review of Systems  Constitutional: Negative.   HENT: Negative.    Eyes: Negative.   Respiratory:  Negative for shortness of breath.   Cardiovascular:  Negative for chest pain.  Gastrointestinal: Negative.   Endocrine: Negative.   Genitourinary: Negative.   Musculoskeletal:  Positive for arthralgias and back pain.  Skin: Negative.   Allergic/Immunologic:  Negative.   Neurological: Negative.   Hematological: Negative.   Psychiatric/Behavioral: Negative.       Objective:    Physical Exam Vitals and nursing note reviewed.  Constitutional:      Appearance: Normal appearance.  HENT:     Head: Normocephalic and atraumatic.     Right Ear: External ear normal.     Left Ear: External ear normal.     Nose: Nose normal.     Mouth/Throat:     Mouth: Mucous membranes are moist.     Pharynx: Oropharynx is clear.  Eyes:     Extraocular Movements: Extraocular movements intact.     Conjunctiva/sclera: Conjunctivae normal.     Pupils: Pupils are equal, round, and reactive to light.  Cardiovascular:     Rate and Rhythm: Normal rate and regular rhythm.     Pulses: Normal pulses.     Heart sounds: Normal heart sounds.  Pulmonary:     Breath sounds: Normal breath sounds.  Musculoskeletal:     Cervical back: Normal, normal range of motion and neck supple.     Thoracic back: Normal.     Lumbar back: Tenderness present. No swelling. Decreased range of motion.     Right foot: Normal range of motion. No swelling or tenderness.     Left foot: Normal.  Skin:    General: Skin is warm and dry.  Neurological:     General: No focal deficit present.     Mental Status: She is alert and oriented to person, place, and time.  Psychiatric:        Mood and Affect: Mood normal.        Behavior: Behavior normal.        Thought Content: Thought content normal.        Judgment: Judgment normal.    BP (!) 146/85 (BP Location: Left Arm, Patient Position: Sitting, Cuff Size: Normal)   Pulse 77   Temp 98.3 F (36.8 C) (Oral)   Resp 18   Ht 5' (1.524 m)   Wt 120 lb (54.4 kg)   LMP 06/04/2011   SpO2 98%   BMI 23.44 kg/m  Wt Readings from Last 3 Encounters:  08/06/21 120 lb (54.4 kg)  02/25/21 119 lb 3.2 oz (54.1 kg)  08/24/20 121 lb 6.4 oz (55.1 kg)     Health Maintenance Due  Topic Date Due   COVID-19 Vaccine (1) Never done   HIV Screening  Never  done   Hepatitis C Screening  Never done   Zoster Vaccines- Shingrix (1 of 2) Never done   PAP SMEAR-Modifier  Never done   COLONOSCOPY (Pts 45-81yr Insurance coverage will need to be  confirmed)  Never done   MAMMOGRAM  Never done   INFLUENZA VACCINE  05/20/2021    There are no preventive care reminders to display for this patient.  Lab Results  Component Value Date   TSH 0.790 02/22/2014   Lab Results  Component Value Date   WBC 4.7 02/25/2021   HGB 11.9 02/25/2021   HCT 37.3 02/25/2021   MCV 83 02/25/2021   PLT 240 02/25/2021   Lab Results  Component Value Date   NA 141 02/25/2021   K 3.9 02/25/2021   CO2 25 02/25/2021   GLUCOSE 87 02/25/2021   BUN 14 02/25/2021   CREATININE 0.65 02/25/2021   BILITOT 0.4 02/25/2021   ALKPHOS 64 02/25/2021   AST 15 02/25/2021   ALT 12 02/25/2021   PROT 6.8 02/25/2021   ALBUMIN 4.4 02/25/2021   CALCIUM 9.6 02/25/2021   EGFR 103 02/25/2021   Lab Results  Component Value Date   CHOL 245 (H) 02/25/2021   Lab Results  Component Value Date   HDL 68 02/25/2021   Lab Results  Component Value Date   LDLCALC 163 (H) 02/25/2021   Lab Results  Component Value Date   TRIG 83 02/25/2021   Lab Results  Component Value Date   CHOLHDL 3.6 02/25/2021   Lab Results  Component Value Date   HGBA1C 5.4 08/24/2020      Assessment & Plan:   Problem List Items Addressed This Visit       Cardiovascular and Mediastinum   Essential hypertension   Relevant Medications   hydrochlorothiazide (HYDRODIURIL) 25 MG tablet   lisinopril (ZESTRIL) 20 MG tablet   potassium chloride (KLOR-CON) 10 MEQ tablet   Elevated blood pressure reading in office with diagnosis of hypertension - Primary   Relevant Medications   hydrochlorothiazide (HYDRODIURIL) 25 MG tablet   lisinopril (ZESTRIL) 20 MG tablet     Other   LOW BACK PAIN SYNDROME   Relevant Orders   DG Lumbar Spine Complete   Mixed hyperlipidemia   Relevant Medications    hydrochlorothiazide (HYDRODIURIL) 25 MG tablet   lisinopril (ZESTRIL) 20 MG tablet   Other Visit Diagnoses     Plantar fasciitis of right foot           Meds ordered this encounter  Medications   hydrochlorothiazide (HYDRODIURIL) 25 MG tablet    Sig: Take 1 tablet (25 mg total) by mouth daily.    Dispense:  90 tablet    Refill:  1    Order Specific Question:   Supervising Provider    Answer:   Asencion Noble E [[id number]]   lisinopril (ZESTRIL) 20 MG tablet    Sig: Take 1 tablet (20 mg total) by mouth daily. To lower blood pressure    Dispense:  90 tablet    Refill:  1    Order Specific Question:   Supervising Provider    Answer:   Asencion Noble E [[id number]]   potassium chloride (KLOR-CON) 10 MEQ tablet    Sig: TAKE 2 TABLETS(20 MEQ) BY MOUTH DAILY    Dispense:  180 tablet    Refill:  1    Order Specific Question:   Supervising Provider    Answer:   WRIGHT, PATRICK E [[id number]]   1. Essential hypertension Encourage patient to check blood pressure at home on a daily basis, keep a written log and have available for all office visits.  Patient was given 62-monthfollow-up appointment with her primary care provider, encouraged to return to  clinic if blood pressures are elevated.  Continue current regimen.  Red flags given for prompt reevaluation. - hydrochlorothiazide (HYDRODIURIL) 25 MG tablet; Take 1 tablet (25 mg total) by mouth daily.  Dispense: 90 tablet; Refill: 1 - lisinopril (ZESTRIL) 20 MG tablet; Take 1 tablet (20 mg total) by mouth daily. To lower blood pressure  Dispense: 90 tablet; Refill: 1 - potassium chloride (KLOR-CON) 10 MEQ tablet; TAKE 2 TABLETS(20 MEQ) BY MOUTH DAILY  Dispense: 180 tablet; Refill: 1  2. Elevated blood pressure reading in office with diagnosis of hypertension   3. Mixed hyperlipidemia Discussed cardiovascular risk score with patient, patient agreeable to start medication.  4. Chronic bilateral low back pain without sciatica Patient education given on  supportive care, previous imaging done in 2018 CLINICAL DATA:  Recent fall with low back pain, initial encounter   EXAM: LUMBAR SPINE - COMPLETE 4+ VIEW   COMPARISON:  None.   FINDINGS: Mild hypertrophic facet changes are noted. No vertebral body compression deformity is seen. Mild anterolisthesis is noted at the lumbosacral junction slightly increased from a prior exam of 2014. No soft tissue abnormality is seen.   IMPRESSION: Mild degenerative change without acute abnormality.     Electronically Signed   By: Inez Catalina M.D.   On: 12/31/2016 09:48  - DG Lumbar Spine Complete; Future     5. Plantar fasciitis of right foot Patient education given on supportive care.   I have reviewed the patient's medical history (PMH, PSH, Social History, Family History, Medications, and allergies) , and have been updated if relevant. I spent 30 minutes reviewing chart and  face to face time with patient.   Follow-up: Return in about 3 months (around 11/06/2021).    Loraine Grip Mayers, PA-C

## 2021-08-15 ENCOUNTER — Ambulatory Visit: Payer: Self-pay

## 2021-09-05 ENCOUNTER — Ambulatory Visit: Payer: Self-pay | Admitting: Internal Medicine

## 2021-10-22 ENCOUNTER — Ambulatory Visit (INDEPENDENT_AMBULATORY_CARE_PROVIDER_SITE_OTHER): Payer: 59 | Admitting: Podiatry

## 2021-10-22 ENCOUNTER — Other Ambulatory Visit: Payer: Self-pay

## 2021-10-22 ENCOUNTER — Encounter: Payer: Self-pay | Admitting: Podiatry

## 2021-10-22 ENCOUNTER — Ambulatory Visit (INDEPENDENT_AMBULATORY_CARE_PROVIDER_SITE_OTHER): Payer: 59

## 2021-10-22 DIAGNOSIS — M722 Plantar fascial fibromatosis: Secondary | ICD-10-CM

## 2021-10-22 DIAGNOSIS — M2141 Flat foot [pes planus] (acquired), right foot: Secondary | ICD-10-CM | POA: Diagnosis not present

## 2021-10-22 DIAGNOSIS — M2142 Flat foot [pes planus] (acquired), left foot: Secondary | ICD-10-CM | POA: Diagnosis not present

## 2021-10-22 MED ORDER — MELOXICAM 15 MG PO TABS: 15.0000 mg | ORAL_TABLET | Freq: Every day | ORAL | 3 refills | Status: DC

## 2021-10-22 NOTE — Patient Instructions (Signed)

## 2021-10-24 NOTE — Progress Notes (Signed)
°  Subjective:  Patient ID: Crystal Whitaker, female    DOB: 1966/04/07,  MRN: [id number]  Heel pain and left heel for 1 week previously treated for plantar fasciitis on the right in April 2022  56 y.o. female presents with the above complaint. History confirmed with patient.  Its worse first in the morning when she gets up  Objective:  Physical Exam: warm, good capillary refill, no trophic changes or ulcerative lesions, normal DP and PT pulses, and normal sensory exam. Left Foot: point tenderness over the heel pad Right Foot: normal exam, no swelling, tenderness, instability; ligaments intact, full range of motion of all ankle/foot joints  No images are attached to the encounter.  Radiographs: Multiple views x-ray of the left foot: no fracture, dislocation, swelling or degenerative changes noted and plantar calcaneal spur Assessment:   1. Pes planus of both feet   2. Plantar fasciitis of left foot      Plan:  Patient was evaluated and treated and all questions answered.  Discussed the etiology and treatment options for plantar fasciitis including stretching, formal physical therapy, supportive shoegears such as a running shoe or sneaker, pre fabricated orthoses, injection therapy, and oral medications. We also discussed the role of surgical treatment of this for patients who do not improve after exhausting non-surgical treatment options.   -XR reviewed with patient -Educated patient on stretching and icing of the affected limb -Injection delivered to the plantar fascia of the left foot. -Rx for meloxicam. Educated on use, risks and benefits of the medication -Power step prefabricated foot orthoses were also recommended for arch support.  She purchases today.   After sterile prep with povidone-iodine solution and alcohol, the left heel was injected with 0.5cc 2% xylocaine plain, 0.5cc 0.5% marcaine plain, 5mg  triamcinolone acetonide, and 2mg  dexamethasone was injected along the  medial plantar fascia at the insertion on the plantar calcaneus. The patient tolerated the procedure well without complication.  Return in about 6 weeks (around 12/03/2021) for recheck plantar fasciitis.

## 2021-10-29 ENCOUNTER — Telehealth: Payer: Self-pay | Admitting: Podiatry

## 2021-10-29 MED ORDER — METHYLPREDNISOLONE 4 MG PO TBPK: ORAL_TABLET | ORAL | 0 refills | Status: DC

## 2021-10-29 NOTE — Telephone Encounter (Signed)
Pt notified of Rx sent to [facility]

## 2021-10-29 NOTE — Addendum Note (Signed)
Addended byLilian Kapur, Damari Suastegui R on: 10/29/2021 05:15 PM   Modules accepted: Orders

## 2021-10-29 NOTE — Telephone Encounter (Signed)
Patient called and would like Dr.McDonald to give her a call. She has some concern about her foot and she can barely walk

## 2021-11-07 ENCOUNTER — Ambulatory Visit: Payer: 59 | Attending: Internal Medicine | Admitting: Internal Medicine

## 2021-11-07 ENCOUNTER — Other Ambulatory Visit: Payer: Self-pay

## 2021-11-07 ENCOUNTER — Encounter: Payer: Self-pay | Admitting: Internal Medicine

## 2021-11-07 VITALS — BP 128/68 | HR 71 | Resp 16 | Wt 126.6 lb

## 2021-11-07 DIAGNOSIS — I10 Essential (primary) hypertension: Secondary | ICD-10-CM | POA: Diagnosis not present

## 2021-11-07 DIAGNOSIS — M722 Plantar fascial fibromatosis: Secondary | ICD-10-CM

## 2021-11-07 DIAGNOSIS — E782 Mixed hyperlipidemia: Secondary | ICD-10-CM

## 2021-11-07 NOTE — Progress Notes (Signed)
Patient ID: Crystal Whitaker, female    DOB: 03/10/1966  MRN: [id number]  CC: Hypertension and Leg Pain (left)   Subjective: Crystal Whitaker is a 56 y.o. female who presents for chronic ds management Her concerns today include:  Patient with history of HTN, lower back pain syndrome, pre-DM (last A1C 08/2020 5.4), HL   Patient was seen by physician assistant in October complaining of pain in the right foot.  Diagnosed with plantars fasciitis and referred to podiatry Dr. Chong SicilianMacDonell.  She saw Dr. Chong SicilianMacDonell 10/22/2021.  She complained of left heel pain for 1 week.  She was diagnosed with pes planus of both feet and plantar fasciitis of the left foot.  X-ray was obtained but I do not see the results in the system.  Patient was educated on stretching and icing.  Injection given to the left heel, placed on meloxicam.  She has a follow-up with him next month.  She tells me that the pain in the left foot persist.  She wears orthotics in the shoes.  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  The orthotics help some but after several hours the left foot/heel begins to hurt again.  She is wanting to know what else can be done to help.    HTN: Is on lisinopril/HCTZ and potassium took already for today. Checks BP 3x a wk. she tells me that her systolic blood pressure ranged from 1 28-135.  She does not recall diastolic blood pressure.  She limits salt in foods.  HL: Reports compliance with taking Crestor. Patient Active Problem List   Diagnosis Date Noted   Elevated blood pressure reading in office with diagnosis of hypertension 08/06/2021   Mixed hyperlipidemia 08/06/2021   Headache(784.0) 02/22/2014   Knee pain, acute 04/21/2013   Essential hypertension 12/15/2008   DEGENERATIVE JOINT DISEASE, SHOULDER 12/15/2008   LOW BACK PAIN SYNDROME 08/02/2007     Current Outpatient Medications on File Prior to Visit  Medication Sig  Dispense Refill   acetaminophen (TYLENOL) 500 MG tablet Take 2 tablets (1,000 mg total) by mouth every 6 (six) hours as needed for moderate pain. 30 tablet 0   diclofenac sodium (VOLTAREN) 1 % GEL Apply 4 g topically 4 (four) times daily. As needed to relieve pain in left knee (Patient not taking: No sig reported) 150 g 4   hydrochlorothiazide (HYDRODIURIL) 25 MG tablet Take 1 tablet (25 mg total) by mouth daily. 90 tablet 1   lisinopril (ZESTRIL) 20 MG tablet Take 1 tablet (20 mg total) by mouth daily. To lower blood pressure 90 tablet 1   meloxicam (MOBIC) 15 MG tablet Take 1 tablet (15 mg total) by mouth daily. 30 tablet 3   methylPREDNISolone (MEDROL DOSEPAK) 4 MG TBPK tablet 6 day dose pack - take as directed 21 tablet 0   potassium chloride (KLOR-CON) 10 MEQ tablet TAKE 2 TABLETS(20 MEQ) BY MOUTH DAILY 180 tablet 1   rosuvastatin (CRESTOR) 10 MG tablet Take 1 tablet (10 mg total) by mouth daily. To lower cholesterol 30 tablet 3   triamcinolone cream (KENALOG) 0.1 % Apply 1 application topically 2 (two) times daily. To area of skin rash 30 g 0   No current facility-administered medications on file prior to visit.    No Known Allergies  Social History   Socioeconomic History   Marital status: Married    Spouse name: Not on file   Number  of children: Not on file   Years of education: Not on file   Highest education level: Not on file  Occupational History   Not on file  Tobacco Use   Smoking status: Never   Smokeless tobacco: Never  Substance and Sexual Activity   Alcohol use: No   Drug use: No   Sexual activity: Not Currently  Other Topics Concern   Not on file  Social History Narrative   Not on file   Social Determinants of Health   Financial Resource Strain: Not on file  Food Insecurity: Not on file  Transportation Needs: Not on file  Physical Activity: Not on file  Stress: Not on file  Social Connections: Not on file  Intimate Partner Violence: Not on file     Family History  Problem Relation Age of Onset   Hypertension Mother    Hypertension Sister    Hypertension Brother     Past Surgical History:  Procedure Laterality Date   CESAREAN SECTION      ROS: Review of Systems Negative except as stated above  PHYSICAL EXAM: BP 128/68    Pulse 71    Resp 16    Wt 126 lb 9.6 oz (57.4 kg)    LMP 06/04/2011    SpO2 98%    BMI 24.72 kg/m   Physical Exam  General appearance - alert, well appearing, and in no distress Mental status -patient tearful at times Chest - clear to auscultation, no wheezes, rales or rhonchi, symmetric air entry Heart - normal rate, regular rhythm, normal S1, S2, no murmurs, rubs, clicks or gallops Extremities - peripheral pulses normal, no pedal edema, no clubbing or cyanosis MSK: Mild to moderate tenderness on palpation of the left plantar heel.  CMP Latest Ref Rng & Units 02/25/2021 08/24/2020 02/20/2020  Glucose 65 - 99 mg/dL 87 92 465(K)  BUN 6 - 24 mg/dL 14 12 9   Creatinine 0.57 - 1.00 mg/dL 8.12 7.51)  Sodium 134 - 144 mmol/L 141 140 139  Potassium 3.5 - 5.2 mmol/L 3.9 3.3(L) 4.2  Chloride 96 - 106 mmol/L 102 102 101  CO2 20 - 29 mmol/L 25 28 24   Calcium 8.7 - 10.2 mg/dL 9.6 9.5 9.7  Total Protein 6.0 - 8.5 g/dL 6.8 6.7 6.8  Total Bilirubin 0.0 - 1.2 mg/dL 0.4 0.4 7.00(F  Alkaline Phos 44 - 121 IU/L 64 67 71  AST 0 - 40 IU/L 15 14 14   ALT 0 - 32 IU/L 12 9 10    Lipid Panel     Component Value Date/Time   CHOL 245 (H) 02/25/2021 1517   TRIG 83 02/25/2021 1517   HDL 68 02/25/2021 1517   CHOLHDL 3.6 02/25/2021 1517   CHOLHDL 4.0 10/23/2016 0954   VLDL 29 10/23/2016 0954   LDLCALC 163 (H) 02/25/2021 1517    CBC    Component Value Date/Time   WBC 4.7 02/25/2021 1517   WBC 14.4 (H) 02/28/2014 0848   RBC 4.52 02/25/2021 1517   RBC 4.29 02/28/2014 0848   HGB 11.9 02/25/2021 1517   HCT 37.3 02/25/2021 1517   PLT 240 02/25/2021 1517   MCV 83 02/25/2021 1517   MCH 26.3 (L) 02/25/2021 1517    MCH 27.0 02/28/2014 0848   MCHC 31.9 02/25/2021 1517   MCHC 33.0 02/28/2014 0848   RDW 14.3 02/25/2021 1517   LYMPHSABS 0.8 02/28/2014 0848   MONOABS 0.8 02/28/2014 0848   EOSABS 0.0 02/28/2014 0848   BASOSABS 0.0 02/28/2014 0848  ASSESSMENT AND PLAN: 1. Essential hypertension At goal.  Continue current medications and low-salt diet.  2. Mixed hyperlipidemia Continue Crestor.  3. Plantar fasciitis, left Keep follow-up appointment with podiatry. We discussed writing a letter for her requesting accommodation at work where she can be allowed to sit for 10 minutes at the top of every hour.  Patient is agreeable.  [employer] Language interpreter used during this encounter. #[id number], Mervat  Patient was given the opportunity to ask questions.  Patient verbalized understanding of the plan and was able to repeat key elements of the plan.   No orders of the defined types were placed in this encounter.    Requested Prescriptions    No prescriptions requested or ordered in this encounter    Return in about 1 month (around 12/08/2021) for PAP.  Jonah Blue, MD, FACP

## 2021-12-03 ENCOUNTER — Other Ambulatory Visit: Payer: Self-pay

## 2021-12-03 ENCOUNTER — Ambulatory Visit (INDEPENDENT_AMBULATORY_CARE_PROVIDER_SITE_OTHER): Payer: 59 | Admitting: Podiatry

## 2021-12-03 ENCOUNTER — Encounter: Payer: Self-pay | Admitting: Podiatry

## 2021-12-03 DIAGNOSIS — M722 Plantar fascial fibromatosis: Secondary | ICD-10-CM | POA: Diagnosis not present

## 2021-12-03 NOTE — Patient Instructions (Signed)
Call [phone] to schedule your physical therapy

## 2021-12-04 ENCOUNTER — Other Ambulatory Visit: Payer: Self-pay | Admitting: Podiatry

## 2021-12-08 ENCOUNTER — Encounter: Payer: Self-pay | Admitting: Podiatry

## 2021-12-08 NOTE — Progress Notes (Signed)
°  Subjective:  Patient ID: Crystal Whitaker, female    DOB: 01-29-1966,  MRN: [id number]  Heel pain and left heel for 1 week previously treated for plantar fasciitis on the right in April 2022  56 y.o. female presents with the above complaint. History confirmed with patient.  She is doing a little better but still painful.  Objective:  Physical Exam: warm, good capillary refill, no trophic changes or ulcerative lesions, normal DP and PT pulses, and normal sensory exam. Left Foot: point tenderness over the heel pad Right Foot: normal exam, no swelling, tenderness, instability; ligaments intact, full range of motion of all ankle/foot joints  No images are attached to the encounter.  Radiographs: Multiple views x-ray of the left foot: no fracture, dislocation, swelling or degenerative changes noted and plantar calcaneal spur Assessment:   No diagnosis found.    Plan:  Patient was evaluated and treated and all questions answered.  Discussed the etiology and treatment options for plantar fasciitis including stretching, formal physical therapy, supportive shoegears such as a running shoe or sneaker, pre fabricated orthoses, injection therapy, and oral medications. We also discussed the role of surgical treatment of this for patients who do not improve after exhausting non-surgical treatment options.   She has had quite a bit of improvement with the last injection.  I recommend she continue to take the meloxicam as needed.  May begin to get some benefit from physical therapy formally as well.  I sent a referral to [facility].  Return in about 8 weeks (around 01/28/2022) for recheck plantar fasciitis.

## 2021-12-09 ENCOUNTER — Ambulatory Visit: Payer: 59 | Admitting: Internal Medicine

## 2021-12-24 ENCOUNTER — Ambulatory Visit: Payer: 59 | Admitting: Podiatry

## 2022-01-02 ENCOUNTER — Ambulatory Visit (INDEPENDENT_AMBULATORY_CARE_PROVIDER_SITE_OTHER): Payer: 59

## 2022-01-02 ENCOUNTER — Encounter: Payer: Self-pay | Admitting: Podiatry

## 2022-01-02 ENCOUNTER — Ambulatory Visit (INDEPENDENT_AMBULATORY_CARE_PROVIDER_SITE_OTHER): Payer: 59 | Admitting: Podiatry

## 2022-01-02 ENCOUNTER — Other Ambulatory Visit: Payer: Self-pay

## 2022-01-02 DIAGNOSIS — M25572 Pain in left ankle and joints of left foot: Secondary | ICD-10-CM | POA: Diagnosis not present

## 2022-01-02 DIAGNOSIS — M7752 Other enthesopathy of left foot: Secondary | ICD-10-CM

## 2022-01-02 DIAGNOSIS — M7750 Other enthesopathy of unspecified foot: Secondary | ICD-10-CM

## 2022-01-07 NOTE — Progress Notes (Signed)
?  Subjective:  ?Patient ID: Crystal Whitaker, female    DOB: [birth year],  MRN: [id number] ? ?Heel pain and left heel for 1 week previously treated for plantar fasciitis on the right in April 2022 ? ?56 y.o. female presents with the above complaint. History confirmed with patient.  Doing much better with heel pain.  She is now having pain towards the outside of the left foot and ankle ? ?Objective:  ?Physical Exam: ?warm, good capillary refill, no trophic changes or ulcerative lesions, normal DP and PT pulses, and normal sensory exam.  No heel pain to palpation or range of motion or pain in the mid fascia.  She does have pain in the sinus tarsi of the left foot and with range of motion of the subtalar joint which is unlimited ? ? ?Radiographs: ?Multiple views x-ray of the left ankle taken today: There are mild degenerative changes in the lateral ankle gutter and sinus tarsi and subtalar joint noted ?Assessment:  ? 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 ? ? ? ?Plan:  ?Patient was evaluated and treated and all questions answered. ? ?Plantar fasciitis seems to be resolved at this point.  Continue exercise until she is 100% pain-free. ? ?Today she had what appeared to be sinus tarsitis of the left ankle and subtalar joint.  I discussed the etiology and treatment options of sinus tarsi syndrome as well as injection therapy, and support with immobilization or brace.  She was hesitant to proceed with injection at this point.  Advised to take anti-inflammatories and she is currently taking the meloxicam.  Tri-Lock ankle brace was dispensed for support.  If not improved by next visit would recommend injection of the ankle. ? ? ?Return in about 6 weeks (around 02/13/2022) for recheck ankle left.  ? ?

## 2022-01-28 ENCOUNTER — Ambulatory Visit (INDEPENDENT_AMBULATORY_CARE_PROVIDER_SITE_OTHER): Payer: 59 | Admitting: Podiatry

## 2022-01-28 DIAGNOSIS — M25572 Pain in left ankle and joints of left foot: Secondary | ICD-10-CM

## 2022-01-28 DIAGNOSIS — M2141 Flat foot [pes planus] (acquired), right foot: Secondary | ICD-10-CM

## 2022-01-28 DIAGNOSIS — M2142 Flat foot [pes planus] (acquired), left foot: Secondary | ICD-10-CM

## 2022-01-28 MED ORDER — METHYLPREDNISOLONE 4 MG PO TBPK: ORAL_TABLET | ORAL | 0 refills | Status: DC

## 2022-01-28 MED ORDER — MELOXICAM 15 MG PO TABS: 15.0000 mg | ORAL_TABLET | Freq: Every day | ORAL | 3 refills | Status: DC

## 2022-02-01 ENCOUNTER — Encounter: Payer: Self-pay | Admitting: Podiatry

## 2022-02-01 NOTE — Progress Notes (Signed)
?  Subjective:  ?Patient ID: Crystal Whitaker, female    DOB: 05/15/1966,  MRN: [id number] ? ?Heel pain and left heel for 1 week previously treated for plantar fasciitis on the right in April 2022 ? ?56 y.o. female returns for follow-up.  Heel is still slightly tender.  The outside of the ankle still hurts ? ?Objective:  ?Physical Exam: ?warm, good capillary refill, no trophic changes or ulcerative lesions, normal DP and PT pulses, and normal sensory exam.  No heel pain to palpation or range of motion or pain in the mid fascia.  She does have pain in the sinus tarsi of the left foot and with range of motion of the subtalar joint which is unlimited ? ? ?Radiographs: ?Multiple views x-ray of the left ankle taken today: There are mild degenerative changes in the lateral ankle gutter and sinus tarsi and subtalar joint noted ?Assessment:  ? 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2. Pes planus of both feet   ? ? ? ? ? ?Plan:  ?Patient was evaluated and treated and all questions answered. ? ?Continue home exercise plan for Planter fasciitis.  I recommend she continue to use the ankle brace as needed.  We again discussed injection for the sinus tarsi syndrome and supportive shoe gear.  She declined the injection and requested another round of anti-inflammatories.  This was prescribed again.  I will see her back as needed at this point. ? ? ?Return if symptoms worsen or fail to improve.  ? ?

## 2022-03-13 ENCOUNTER — Ambulatory Visit: Payer: 59 | Admitting: Podiatry

## 2022-03-25 ENCOUNTER — Encounter: Payer: Self-pay | Admitting: Podiatry

## 2022-03-25 ENCOUNTER — Ambulatory Visit (INDEPENDENT_AMBULATORY_CARE_PROVIDER_SITE_OTHER): Payer: 59 | Admitting: Podiatry

## 2022-03-25 DIAGNOSIS — M76821 Posterior tibial tendinitis, right leg: Secondary | ICD-10-CM

## 2022-03-25 DIAGNOSIS — M722 Plantar fascial fibromatosis: Secondary | ICD-10-CM

## 2022-03-25 DIAGNOSIS — M6788 Other specified disorders of synovium and tendon, other site: Secondary | ICD-10-CM | POA: Diagnosis not present

## 2022-03-25 DIAGNOSIS — M25572 Pain in left ankle and joints of left foot: Secondary | ICD-10-CM | POA: Diagnosis not present

## 2022-03-25 NOTE — Patient Instructions (Signed)
Call [facility] to schedule your MRI at the below locations.  Please allow at least 1 business day after your visit to process the referral.  It may take longer depending on approval from insurance.  Please let me know if you have issues or problems scheduling the MRI   [facility]  [phone] [address] [place], [place] [address]  [facility] [phone] [address] [place], [place] [address]  

## 2022-03-25 NOTE — Progress Notes (Signed)
  Subjective:  Patient ID: Crystal Whitaker, female    DOB: [birth year],  MRN: [id number]  Continuing worsening pain still bothersome in the left is much worse she wears the Tri-Lock but it does not help  56 y.o. female returns for follow-up.  Still quite painful.  Most the pain is in the outside of the left ankle.  Sometimes has pain on the inside of the right heel and right ankle medially  Objective:  Physical Exam: warm, good capillary refill, no trophic changes or ulcerative lesions, normal DP and PT pulses, and normal sensory exam.  Today she has pain on palpation along the posterior tibial tendon the plantar fascia bilateral and the peroneal tendons in the left with limited range of motion with resistance and eversion on the left and inversion on the right   Radiographs: Multiple views x-ray of the left ankle taken previously: There are mild degenerative changes in the lateral ankle gutter and sinus tarsi and subtalar joint noted, she does have a small heel spur on the left, her previous right foot radiographs 02/03/2021 show small heel spur as well Assessment:   1. Sinus tarsi syndrome of left ankle   2. Plantar fasciitis of left foot   3. Posterior tibial tendinitis, right   4. Left peroneal tendinosis        Plan:  Patient was evaluated and treated and all questions answered.  At this point this is been going on for at least 5 months.  She has had several rounds of anti-inflammatories at home physical therapy and has not improved.  A Tri-Lock ankle brace has not been helpful in eliminating this either.  I recommended increasing her mobilization into a short cam boot and this was dispensed today.  I would like to order MRIs for evaluation of the posterior tibial and peroneal tendons and plantar fascia of the bilateral ankle and foot.  These be ordered in [facility] imaging I will see her back in 6 weeks for reevaluation.  Continue CAM boot and home therapy until then.  She is not able  to work in a walking boot and so we will take her out of work for 6 weeks until the next visit.  We discussed this may require surgical intervention ultimately   Return in about 6 weeks (around 05/06/2022) for after MRI to review, re-check peroneal tendinitis, re-check Achilles tendon.

## 2022-03-27 ENCOUNTER — Telehealth: Payer: Self-pay | Admitting: *Deleted

## 2022-03-27 NOTE — Telephone Encounter (Signed)
Patient called stating that [facility] does not accept her health plan, refaxed to [employer] for prior approval to [facility]. Waiting for authorization.

## 2022-03-27 NOTE — Telephone Encounter (Signed)
[employer] has pre-approved ,cpt code: 27035, authorization #[id number] Effective dates: 03/27/22-06/27/22 Patient has been scheduled at [facility] July 10 th @12 :00,1:00.

## 2022-04-01 ENCOUNTER — Telehealth: Payer: Self-pay | Admitting: Podiatry

## 2022-04-01 NOTE — Telephone Encounter (Signed)
Ammie had this approved at [facility]. Is this different? Please coordinate

## 2022-04-01 NOTE — Telephone Encounter (Signed)
Waiting on prior authorization from health plan

## 2022-04-01 NOTE — Telephone Encounter (Signed)
Pt scheduled for a mri but the location does not accept her insurance. She has [employer]. Could you please change the location for her to get the mri and let her know.

## 2022-04-07 ENCOUNTER — Telehealth: Payer: Self-pay | Admitting: *Deleted

## 2022-04-07 NOTE — Telephone Encounter (Signed)
Patient has been pre authorizated thru [employer]- #[id number],T/ l MRI lower extremity Joint w w/o .valid date from : 03/27/22-06/27/22-[facility] [address] [phone]. Patient has been notified.

## 2022-04-12 ENCOUNTER — Ambulatory Visit (HOSPITAL_BASED_OUTPATIENT_CLINIC_OR_DEPARTMENT_OTHER)
Admission: RE | Admit: 2022-04-12 | Discharge: 2022-04-12 | Disposition: A | Discharge status: Home / Self Care | Payer: 59 | Source: Ambulatory Visit | Attending: Podiatry | Admitting: Podiatry

## 2022-04-12 DIAGNOSIS — M76821 Posterior tibial tendinitis, right leg: Secondary | ICD-10-CM | POA: Diagnosis present

## 2022-04-12 DIAGNOSIS — M722 Plantar fascial fibromatosis: Secondary | ICD-10-CM | POA: Diagnosis present

## 2022-04-12 DIAGNOSIS — M25572 Pain in left ankle and joints of left foot: Secondary | ICD-10-CM | POA: Insufficient documentation

## 2022-04-12 DIAGNOSIS — M6788 Other specified disorders of synovium and tendon, other site: Secondary | ICD-10-CM | POA: Diagnosis present

## 2022-04-15 IMAGING — DX DG FINGER THUMB 2+V*L*
3 series · 3 of 3 positions shown · non-contrast
Comparison: None.

CLINICAL DATA: MCP joint left thumb pain and swelling for 1 week.

EXAM:
LEFT THUMB 2+V

[finger ap]
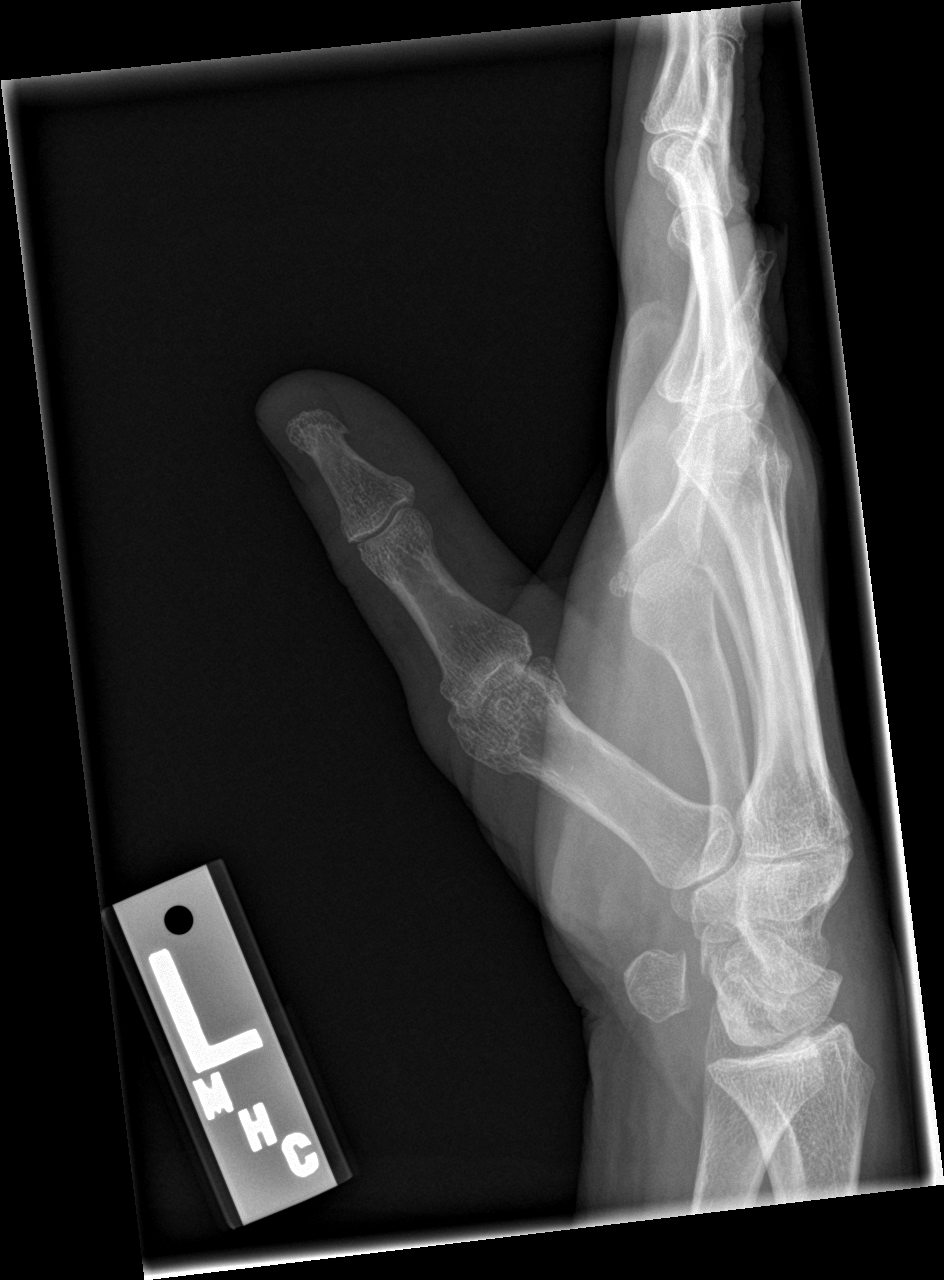

[finger obl]
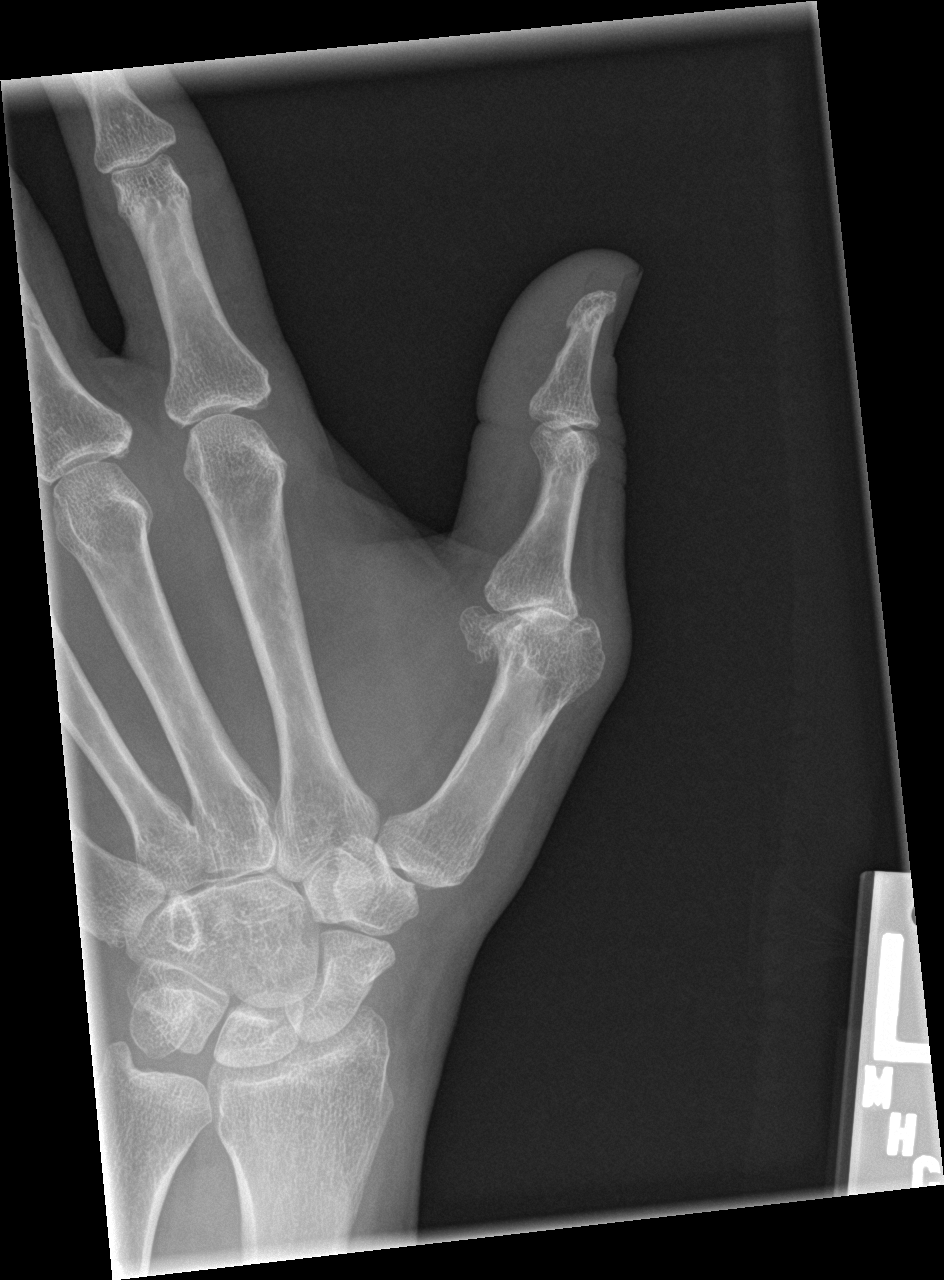

[finger lat]
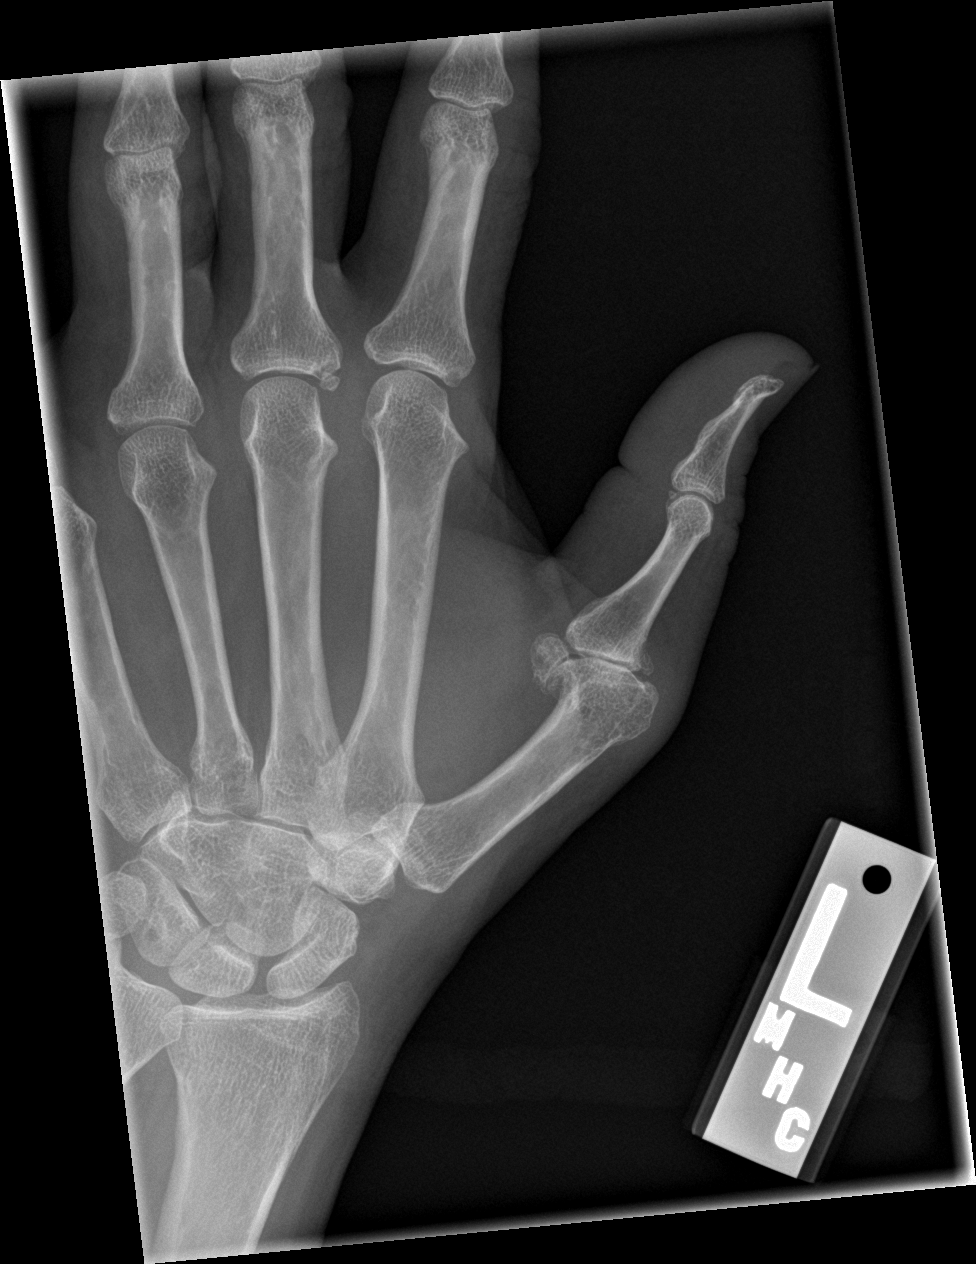

[3 of 3 positions shown; findings below may reference images not displayed]

FINDINGS: No fracture or dislocation. No suspicious focal osseous lesions.
Moderate first MCP joint osteoarthritis. No radiopaque foreign
bodies.
IMPRESSION: Moderate first MCP joint osteoarthritis. No acute osseous
abnormality.

## 2022-04-28 ENCOUNTER — Other Ambulatory Visit (HOSPITAL_COMMUNITY): Payer: 59

## 2022-05-06 ENCOUNTER — Encounter: Payer: Self-pay | Admitting: Podiatry

## 2022-05-06 ENCOUNTER — Ambulatory Visit (INDEPENDENT_AMBULATORY_CARE_PROVIDER_SITE_OTHER): Payer: 59 | Admitting: Podiatry

## 2022-05-06 DIAGNOSIS — M722 Plantar fascial fibromatosis: Secondary | ICD-10-CM | POA: Diagnosis not present

## 2022-05-06 DIAGNOSIS — M6788 Other specified disorders of synovium and tendon, other site: Secondary | ICD-10-CM | POA: Diagnosis not present

## 2022-05-06 NOTE — Patient Instructions (Signed)
Call to schedule physical therapy: [facility] at [place] [address]  [phone]  

## 2022-05-10 NOTE — Progress Notes (Signed)
  Subjective:  Patient ID: Crystal Whitaker, female    DOB: Oct 13, 1966,  MRN: [id number]  Continuing worsening pain still bothersome in the left is much worse she wears the Tri-Lock but it does not help  56 y.o. female returns for follow-up.  Still quite painful.  Most the pain is in the outside of the left ankle.  Sometimes has pain on the inside of the right heel and right ankle medially  Interval history: She returns for follow-up after completing the MRIs.  Still fairly painful for her, the worst is the left lateral ankle  Objective:  Physical Exam: warm, good capillary refill, no trophic changes or ulcerative lesions, normal DP and PT pulses, and normal sensory exam.  Today she has pain on palpation along the posterior tibial tendon the plantar fascia bilateral and the peroneal tendons in the left with limited range of motion with resistance and eversion on the left and inversion on the right   Radiographs: Multiple views x-ray of the left ankle taken previously: There are mild degenerative changes in the lateral ankle gutter and sinus tarsi and subtalar joint noted, she does have a small heel spur on the left, her previous right foot radiographs 02/03/2021 show small heel spur as well   IMPRESSION: Tendinosis and tenosynovitis of the inframalleolar peroneal tendons, with possible small interstitial tear of the peroneal brevis and small focal split tear of the peroneal longus just prior to the cuboid. Adjacent edema in the soft tissues and within the lateral calcaneus.   Plantar fasciitis involving the proximal central bundle with adjacent reactive edema in the plantar calcaneus with small plantar calcaneal spur.   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   Intact ankle ligaments.     Electronically Signed   By: Caprice Renshaw M.D.   On: 04/14/2022 10:10    IMPRESSION: Mild plantar fasciitis involving the proximal central bundle near the calcaneal  insertion.   Mild tendinosis and tenosynovitis of the infra malleolar peroneal tendons.   Mild Achilles peritendinitis.   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     Electronically Signed   By: Caprice Renshaw M.D.   On: 04/14/2022 10:19     Assessment:   1. Plantar fasciitis of left foot   2. Left peroneal tendinosis        Plan:  Patient was evaluated and treated and all questions answered.  We reviewed the results of the MRI findings in detail.  We discussed the presence of the possible split tear on the left ankle.  We discussed surgical and nonsurgical treatment.  She is hesitant to proceed with surgery still at this point and would like to try further physical therapy.  A referral was sent and I will see her back for follow-up in about a month.  If it worsens or does not improve then I would recommend we proceed with surgical correction.   Return in about 6 weeks (around 06/17/2022) for re-check peroneal tendinitis.

## 2022-05-12 NOTE — Therapy (Signed)
OUTPATIENT PHYSICAL THERAPY LOWER EXTREMITY EVALUATION   Patient Name: Crystal Whitaker MRN: [id number] DOB:1966/02/13, 56 y.o., female Today's Date: 05/13/2022   PT End of Session - 05/13/22 1103     Visit Number 1    Number of Visits 13    Date for PT Re-Evaluation 06/28/22    Authorization Type [employer]    PT Start Time 1103    PT Stop Time 1143    PT Time Calculation (min) 40 min    Activity Tolerance Patient tolerated treatment well    Behavior During Therapy WFL for tasks assessed/performed             Past Medical History:  Diagnosis Date   Back pain    Hypertension    Sinusitis    Past Surgical History:  Procedure Laterality Date   CESAREAN SECTION     Patient Active Problem List   Diagnosis Date Noted   Elevated blood pressure reading in office with diagnosis of hypertension 08/06/2021   Mixed hyperlipidemia 08/06/2021   Headache(784.0) 02/22/2014   Knee pain, acute 04/21/2013   Essential hypertension 12/15/2008   DEGENERATIVE JOINT DISEASE, SHOULDER 12/15/2008   LOW BACK PAIN SYNDROME 08/02/2007    PCP: Marcine Matar, MD  REFERRING PROVIDER: Edwin Cap, DPM   REFERRING DIAG: M72.2 (ICD-10-CM) - Plantar fasciitis of left foot M67.88 (ICD-10-CM) - Left peroneal tendinosis   THERAPY DIAG:  Pain in left ankle and joints of left foot  Muscle weakness (generalized)  Difficulty in walking, not elsewhere classified  Localized edema  Rationale for Evaluation and Treatment Rehabilitation  ONSET DATE: chronic   SUBJECTIVE:   SUBJECTIVE STATEMENT: Patient reports pain about the left side of the left foot that began about 6 months ago of insidious onset. The pain is along the plantar aspect of the foot and courses along the lateral aspect of the ankle. She has received an injection, which provided short term pain relief. She also notices swelling about the ankle with prolonged standing/walking. She has been wearing an ankle brace since March. She  also reports intermittent pain about the plantar aspect of the Rt foot that has been ongoing for a year and has received 4 injections.   PERTINENT HISTORY: None   PAIN:  Are you having pain? Yes: NPRS scale: 8/10 Pain location: Lt lateral ankle Pain description: unable to describe; intermittent Aggravating factors: first thing in the morning, walking, standing Relieving factors: rest, medication  PRECAUTIONS: None  WEIGHT BEARING RESTRICTIONS No  FALLS:  Has patient fallen in last 6 months? 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 Adjacent edema in the soft tissues and within the lateral calcaneus.   Plantar fasciitis involving the proximal central bundle with adjacent reactive edema in the plantar calcaneus with small plantar calcaneal spur.   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   Intact ankle ligaments.  Right ankle MRI: IMPRESSION: Mild plantar fasciitis involving the proximal central bundle near the calcaneal insertion.   Mild tendinosis and tenosynovitis of the infra malleolar peroneal  tendons.   Mild Achilles peritendinitis.   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 PATIENT SURVEYS:  N/A due to language   COGNITION:  Overall cognitive status: Within functional limits for tasks assessed     SENSATION: WFL  EDEMA:  Mild non-pitting edema about the Lt lateral ankle    POSTURE: No  Significant postural limitations  PALPATION: TTP   LOWER EXTREMITY ROM:  Active ROM Right eval Left eval  Hip flexion    Hip extension    Hip abduction    Hip adduction    Hip internal rotation    Hip external rotation    Knee flexion    Knee extension    Ankle dorsiflexion 5 2  Ankle plantarflexion WNL WNL  Ankle inversion WNL WNL pn!  Ankle eversion WNL WNL pn!   (Blank rows = not tested)  LOWER EXTREMITY MMT:  MMT Right eval Left eval  Hip flexion    Hip extension    Hip abduction    Hip adduction    Hip internal rotation    Hip external rotation    Knee flexion    Knee extension    Ankle dorsiflexion 5 5  Ankle plantarflexion Able to complete full range calf raise  Unable to complete calf raise   Ankle inversion 5 4-  Ankle eversion 5 3+ pain   (Blank rows = not tested)  LOWER EXTREMITY SPECIAL TESTS:  N/A  FUNCTIONAL TESTS:  SLS 7 seconds LLE; 30 seconds RLE  Lifting: no hip or knee flexion; completes by flexing only at the lumbar spine; pain   GAIT: Distance walked: 20 ft  Assistive device utilized: None Level of assistance: Complete Independence Comments: excessive pronation; decreased push off on the LLE     TODAY'S TREATMENT: [facility] Adult PT Treatment:                                                DATE: 05/13/22 Therapeutic Exercise: Demonstrated and issue initial HEP.   Therapeutic Activity: Education on assessment findings that will be addressed throughout duration of POC.      PATIENT EDUCATION:  Education details: see treatment  Person educated: patient  Education method: Explanation, Demonstration, Tactile cues, Verbal cues, and Handouts Education comprehension: verbalized understanding, returned demonstration, verbal cues required, tactile cues required, and needs further education   HOME EXERCISE PROGRAM: Access Code: [email or web address] URL: [email or web address] Date: 05/13/2022 Prepared by: Letitia Libra  Exercises - Isometric Ankle Eversion at Wall  - 1 x daily - 7 x weekly - 2 sets - 10 reps - Long Sitting Calf Stretch with Strap  - 1 x daily - 7 x weekly - 3 sets - 30 sec  hold - Seated Ankle Plantarflexion with Resistance  - 1 x daily - 7 x weekly - 2 sets - 10 reps  ASSESSMENT:  CLINICAL IMPRESSION: Patient is a 56 y.o. female who was seen today for physical therapy evaluation and treatment for chronic Lt foot pain of insidious onset. Her pain is localized to the lateral ankle and plantar aspect of the foot. She is noted to have limited ankle DF AROM and pain with ankle inversion/eversion AROM. She has weakness about the Lt ankle most notable in the peroneals and gastroc. She is also noted to have gait and balance impairments. She will benefit from skilled PT to address  the above stated deficits in order to optimize her function.    OBJECTIVE IMPAIRMENTS Abnormal gait, decreased activity tolerance, decreased balance, difficulty walking, decreased ROM, decreased strength, increased edema, improper body mechanics, and pain.   ACTIVITY LIMITATIONS carrying, lifting, standing, squatting, stairs, and locomotion level  PARTICIPATION LIMITATIONS: meal prep, cleaning, laundry, shopping, community activity, occupation, and yard work  PERSONAL FACTORS Age, Profession, and Time since onset of injury/illness/exacerbation are also affecting patient's functional outcome.   REHAB POTENTIAL: Good  CLINICAL DECISION MAKING: Stable/uncomplicated  EVALUATION COMPLEXITY: Low   GOALS: Goals reviewed with patient? No  SHORT TERM GOALS: Target date: 06/03/2022  Patient will be independent and compliant with initial HEP.   Baseline: issued at eval  Goal status: INITIAL  2.  Patient will be able to perform SL calf raise on the LLE to improve gait mechanics.  Baseline: unable  Goal status: INITIAL  3.  Patient will demonstrate proper lifting mechanics without an increase in Lt foot pain.   Baseline: see above  Goal status: INITIAL    LONG TERM GOALS: Target date: 06/24/2022   Patient will demonstrate at least 4/5 Lt ankle inversion and eversion strength to improve her gait stability.   Baseline: see above  Goal status: INITIAL  2.  Patient will maintain SLS on the LLE for at least 15 seconds to improve her stability with walking on uneven terrain.  Baseline: see above  Goal status: INITIAL  3.  Patient will report pain at worst rated as 5/10 to improve her tolerance to walking and standing activity.  Baseline: see above  Goal status: INITIAL   PLAN: PT FREQUENCY: 2x/week  PT DURATION: 6 weeks  PLANNED INTERVENTIONS: Therapeutic exercises, Therapeutic activity, Neuromuscular re-education, Balance training, Gait training, Patient/Family education, Self Care, Joint mobilization, Stair training, Dry Needling, Electrical stimulation, Cryotherapy, Moist heat, Vasopneumatic device, Ultrasound, Ionotophoresis 4mg /ml Dexamethasone, Manual therapy, and Re-evaluation  PLAN FOR NEXT SESSION: review/update HEP PRN, progress ankle strengthening as tolerated, manual to peroneals/plantar fascia, foot intrinsic strengthening   , PT, DPT, ATC 05/13/22 4:25 PM

## 2022-05-13 ENCOUNTER — Ambulatory Visit: Payer: Commercial Managed Care - HMO | Attending: Podiatry

## 2022-05-13 DIAGNOSIS — M722 Plantar fascial fibromatosis: Secondary | ICD-10-CM | POA: Diagnosis not present

## 2022-05-13 DIAGNOSIS — M6788 Other specified disorders of synovium and tendon, other site: Secondary | ICD-10-CM | POA: Diagnosis not present

## 2022-05-13 DIAGNOSIS — R262 Difficulty in walking, not elsewhere classified: Secondary | ICD-10-CM | POA: Diagnosis present

## 2022-05-13 DIAGNOSIS — M25572 Pain in left ankle and joints of left foot: Secondary | ICD-10-CM | POA: Diagnosis present

## 2022-05-13 DIAGNOSIS — R600 Localized edema: Secondary | ICD-10-CM | POA: Diagnosis present

## 2022-05-13 DIAGNOSIS — M6281 Muscle weakness (generalized): Secondary | ICD-10-CM | POA: Diagnosis present

## 2022-05-19 NOTE — Therapy (Signed)
OUTPATIENT PHYSICAL THERAPY TREATMENT NOTE   Patient Name: Crystal Whitaker MRN: [id number] DOB:11-29-1965, 56 y.o., female Today's Date: 05/20/2022  PCP: Marcine Matar, MD   REFERRING PROVIDER: Edwin Cap, DPM    END OF SESSION:   PT End of Session - 05/20/22 1158     Visit Number 2    Number of Visits 13    Date for PT Re-Evaluation 06/28/22    Authorization Type [employer]    PT Start Time 1200    PT Stop Time 1240    PT Time Calculation (min) 40 min    Activity Tolerance Patient tolerated treatment well    Behavior During Therapy WFL for tasks assessed/performed             Past Medical History:  Diagnosis Date   Back pain    Hypertension    Sinusitis    Past Surgical History:  Procedure Laterality Date   CESAREAN SECTION     Patient Active Problem List   Diagnosis Date Noted   Elevated blood pressure reading in office with diagnosis of hypertension 08/06/2021   Mixed hyperlipidemia 08/06/2021   Headache(784.0) 02/22/2014   Knee pain, acute 04/21/2013   Essential hypertension 12/15/2008   DEGENERATIVE JOINT DISEASE, SHOULDER 12/15/2008   LOW BACK PAIN SYNDROME 08/02/2007    REFERRING DIAG: Plantar fasciitis of left foot, Left peroneal tendinosis   THERAPY DIAG:  Pain in left ankle and joints of left foot  Muscle weakness (generalized)  Difficulty in walking, not elsewhere classified  Localized edema  Rationale for Evaluation and Treatment Rehabilitation  PERTINENT HISTORY: None  PRECAUTIONS: None   SUBJECTIVE: Patient reports she is good. She works on the exercises at home. The outside of her ankle feels better.  Offered patient video interpreter but she denied.  PAIN:  Are you having pain? Yes:  NPRS scale: 6/10 Pain location: Left lateral ankle Pain description: unable to describe; intermittent Aggravating factors: first thing in the morning, walking, standing Relieving factors: rest, medication  PATIENT GOALS "feel  better"   OBJECTIVE: (objective measures completed at initial evaluation unless otherwise dated) EDEMA:  Mild non-pitting edema about the Lt lateral ankle    PALPATION: TTP    LOWER EXTREMITY ROM:   Active ROM Right eval Left eval Rt / Lt 05/20/2022  Hip flexion       Hip extension       Hip abduction       Hip adduction       Hip internal rotation       Hip external rotation       Knee flexion       Knee extension       Ankle dorsiflexion 5 2 8  / 6 deg  Ankle plantarflexion WNL WNL   Ankle inversion WNL WNL pn!   Ankle eversion WNL WNL pn!    (Blank rows = not tested)   LOWER EXTREMITY MMT:   MMT Right eval Left eval  Hip flexion      Hip extension      Hip abduction      Hip adduction      Hip internal rotation      Hip external rotation      Knee flexion      Knee extension      Ankle dorsiflexion 5 5  Ankle plantarflexion Able to complete full range calf raise  Unable to complete calf raise   Ankle inversion 5 4-  Ankle eversion 5 3+ pain   (  Blank rows = not tested)   LOWER EXTREMITY SPECIAL TESTS:  N/A   FUNCTIONAL TESTS:  SLS 7 seconds LLE; 30 seconds RLE  Lifting: no hip or knee flexion; completes by flexing only at the lumbar spine; pain    GAIT: Distance walked: 20 ft  Assistive device utilized: None Level of assistance: Complete Independence Comments: excessive pronation; decreased push off on the LLE        TODAY'S TREATMENT: [facility] Adult PT Treatment:                                                DATE: 05/20/2022 Therapeutic Exercise: NuStep L4 x 5 min with UE/LE while taking subjective Longsitting calf stretch with strap 2 x 30 sec each 4-way banded ankle with yellow 2 x 20 (PF with blue) Seated towel scrunches 2 x 20 Seated towel slide inversion and eversion with 2# 2 x 3 each Instructed on self banded eversion with yellow for HEP   [facility] Adult PT Treatment:                                                DATE: 05/13/22 Therapeutic  Exercise: Demonstrated and issue initial HEP.  Therapeutic Activity: Education on assessment findings that will be addressed throughout duration of POC.     PATIENT EDUCATION:  Education details: HEP update Person educated: Patient  Education method: Explanation, Demonstration, Tactile cues, Verbal cues, and Handouts Education comprehension: Verbalized understanding, returned demonstration, Verbal cues required, tactile cues required, and needs further education   HOME EXERCISE PROGRAM: Access Code: [email or web address]    ASSESSMENT: CLINICAL IMPRESSION: Patient tolerated therapy well with no adverse effects. Therapy focused on progress ankle mobility and strength with good tolerance. Exercises were primarily mat based but she was able to progress with resistance for ankle strengthening without report of increased pain. Updated HEP to incorporate peroneal strengthening. Patient would benefit from continued skilled PT to progress her mobility and strength in order to reduce pain and maximize functional ability.     OBJECTIVE IMPAIRMENTS Abnormal gait, decreased activity tolerance, decreased balance, difficulty walking, decreased ROM, decreased strength, increased edema, improper body mechanics, and pain.    ACTIVITY LIMITATIONS carrying, lifting, standing, squatting, stairs, and locomotion level   PARTICIPATION LIMITATIONS: meal prep, cleaning, laundry, shopping, community activity, occupation, and yard work   PERSONAL FACTORS Age, Profession, and Time since onset of injury/illness/exacerbation are also affecting patient's functional outcome.      GOALS: Goals reviewed with patient? No   SHORT TERM GOALS: Target date: 06/03/2022   Patient will be independent and compliant with initial HEP.  Baseline: issued at eval  Goal status: INITIAL   2.  Patient will be able to perform SL calf raise on the LLE to improve gait mechanics.  Baseline: unable  Goal status: INITIAL   3.  Patient will  demonstrate proper lifting mechanics without an increase in Lt foot pain.  Baseline: see above  Goal status: INITIAL   LONG TERM GOALS: Target date: 06/24/2022    Patient will demonstrate at least 4/5 Lt ankle inversion and eversion strength to improve her gait stability.   Baseline: see above  Goal status: INITIAL   2.  Patient will maintain SLS  on the LLE for at least 15 seconds to improve her stability with walking on uneven terrain.  Baseline: see above  Goal status: INITIAL   3.  Patient will report pain at worst rated as 5/10 to improve her tolerance to walking and standing activity.  Baseline: see above  Goal status: INITIAL     PLAN: PT FREQUENCY: 2x/week   PT DURATION: 6 weeks   PLANNED INTERVENTIONS: Therapeutic exercises, Therapeutic activity, Neuromuscular re-education, Balance training, Gait training, Patient/Family education, Self Care, Joint mobilization, Stair training, Dry Needling, Electrical stimulation, Cryotherapy, Moist heat, Vasopneumatic device, Ultrasound, Ionotophoresis 4mg /ml Dexamethasone, Manual therapy, and Re-evaluation   PLAN FOR NEXT SESSION: review/update HEP PRN, progress ankle strengthening as tolerated, manual to peroneals/plantar fascia, foot intrinsic strengthening     , PT, DPT, LAT, ATC 05/20/22  1:11 PM Phone: [phone] Fax: [phone]

## 2022-05-20 ENCOUNTER — Other Ambulatory Visit: Payer: Self-pay

## 2022-05-20 ENCOUNTER — Encounter: Payer: Self-pay | Admitting: Physical Therapy

## 2022-05-20 ENCOUNTER — Other Ambulatory Visit: Payer: Self-pay | Admitting: Internal Medicine

## 2022-05-20 ENCOUNTER — Ambulatory Visit: Payer: Commercial Managed Care - HMO | Attending: Podiatry | Admitting: Physical Therapy

## 2022-05-20 DIAGNOSIS — R600 Localized edema: Secondary | ICD-10-CM | POA: Insufficient documentation

## 2022-05-20 DIAGNOSIS — M25572 Pain in left ankle and joints of left foot: Secondary | ICD-10-CM | POA: Insufficient documentation

## 2022-05-20 DIAGNOSIS — I10 Essential (primary) hypertension: Secondary | ICD-10-CM

## 2022-05-20 DIAGNOSIS — R262 Difficulty in walking, not elsewhere classified: Secondary | ICD-10-CM | POA: Insufficient documentation

## 2022-05-20 DIAGNOSIS — M6281 Muscle weakness (generalized): Secondary | ICD-10-CM | POA: Diagnosis present

## 2022-05-20 NOTE — Patient Instructions (Signed)
Access Code: [email or web address] URL: [email or web address] Date: 05/20/2022 Prepared by: Rosana Hoes  Exercises - Isometric Ankle Eversion at Wall  - 1 x daily - 7 x weekly - 2 sets - 10 reps - Long Sitting Calf Stretch with Strap  - 1 x daily - 7 x weekly - 3 sets - 30 sec  hold - Seated Ankle Plantarflexion with Resistance  - 1 x daily - 7 x weekly - 3 sets - 20 reps - Long Sitting Ankle Eversion with Resistance  - 1 x daily - 7 x weekly - 3 sets - 20 reps

## 2022-05-23 ENCOUNTER — Ambulatory Visit: Payer: Commercial Managed Care - HMO | Admitting: Physical Therapy

## 2022-05-23 ENCOUNTER — Encounter: Payer: Self-pay | Admitting: Physical Therapy

## 2022-05-23 DIAGNOSIS — M25572 Pain in left ankle and joints of left foot: Secondary | ICD-10-CM | POA: Diagnosis not present

## 2022-05-23 DIAGNOSIS — R600 Localized edema: Secondary | ICD-10-CM

## 2022-05-23 DIAGNOSIS — R262 Difficulty in walking, not elsewhere classified: Secondary | ICD-10-CM

## 2022-05-23 DIAGNOSIS — M6281 Muscle weakness (generalized): Secondary | ICD-10-CM

## 2022-05-23 NOTE — Therapy (Signed)
OUTPATIENT PHYSICAL THERAPY TREATMENT NOTE   Patient Name: Crystal Whitaker MRN: [id number] DOB:06/07/1966, 56 y.o., female Today's Date: 05/23/2022  PCP: Marcine Matar, MD   REFERRING PROVIDER: Edwin Cap, DPM    END OF SESSION:   PT End of Session - 05/23/22 1227     Visit Number 3    Number of Visits 13    Date for PT Re-Evaluation 06/28/22    Authorization Type [employer]    PT Start Time 1228    PT Stop Time 1307    PT Time Calculation (min) 39 min             Past Medical History:  Diagnosis Date   Back pain    Hypertension    Sinusitis    Past Surgical History:  Procedure Laterality Date   CESAREAN SECTION     Patient Active Problem List   Diagnosis Date Noted   Elevated blood pressure reading in office with diagnosis of hypertension 08/06/2021   Mixed hyperlipidemia 08/06/2021   Headache(784.0) 02/22/2014   Knee pain, acute 04/21/2013   Essential hypertension 12/15/2008   DEGENERATIVE JOINT DISEASE, SHOULDER 12/15/2008   LOW BACK PAIN SYNDROME 08/02/2007    REFERRING DIAG: Plantar fasciitis of left foot, Left peroneal tendinosis   THERAPY DIAG:  Pain in left ankle and joints of left foot  Muscle weakness (generalized)  Difficulty in walking, not elsewhere classified  Localized edema  Rationale for Evaluation and Treatment Rehabilitation  PERTINENT HISTORY: None  PRECAUTIONS: None   SUBJECTIVE: Patient reports she is good. She works on the exercises at home. The outside of her ankle feels better.  Offered patient video interpreter but she denied.  PAIN:  Are you having pain? Yes:  NPRS scale: 8/10 Pain location: Left lateral ankle Pain description: unable to describe; intermittent Aggravating factors: first thing in the morning, walking, standing Relieving factors: rest, medication  PATIENT GOALS "feel better"   OBJECTIVE: (objective measures completed at initial evaluation unless otherwise dated) EDEMA:  Mild non-pitting  edema about the Lt lateral ankle    PALPATION: TTP    LOWER EXTREMITY ROM:   Active ROM Right eval Left eval Rt / Lt 05/20/2022  Hip flexion       Hip extension       Hip abduction       Hip adduction       Hip internal rotation       Hip external rotation       Knee flexion       Knee extension       Ankle dorsiflexion 5 2 8  / 6 deg  Ankle plantarflexion WNL WNL   Ankle inversion WNL WNL pn!   Ankle eversion WNL WNL pn!    (Blank rows = not tested)   LOWER EXTREMITY MMT:   MMT Right eval Left eval  Hip flexion      Hip extension      Hip abduction      Hip adduction      Hip internal rotation      Hip external rotation      Knee flexion      Knee extension      Ankle dorsiflexion 5 5  Ankle plantarflexion Able to complete full range calf raise  Unable to complete calf raise   Ankle inversion 5 4-  Ankle eversion 5 3+ pain   (Blank rows = not tested)   LOWER EXTREMITY SPECIAL TESTS:  N/A   FUNCTIONAL TESTS:  SLS 7 seconds LLE; 30 seconds RLE  Lifting: no hip or knee flexion; completes by flexing only at the lumbar spine; pain    GAIT: Distance walked: 20 ft  Assistive device utilized: None Level of assistance: Complete Independence Comments: excessive pronation; decreased push off on the LLE        [facility] Adult PT Treatment:                                                DATE: 05/23/2022 Therapeutic Exercise: NuStep L4 x 5 min with UE/LE while taking subjective Runners stretch , soleus stretch Wooden rocker board x 1 minute Bilateral heel raise x 15 Slant board stretch SLS 30 sec without UE Left  3-way banded ankle with 2 x 15 red Seated towel scrunches 2 x 20 Modalities: Iontophoresis  1 mL dex 4-6 hours, slow release patch, left lateral ankle   [facility] Adult PT Treatment:                                                DATE: 05/20/2022 Therapeutic Exercise: NuStep L4 x 5 min with UE/LE while taking subjective Longsitting calf stretch with strap 2  x 30 sec each 4-way banded ankle with yellow 2 x 20 (PF with blue) Seated towel scrunches 2 x 20 Seated towel slide inversion and eversion with 2# 2 x 3 each Instructed on self banded eversion with yellow for HEP   [facility] Adult PT Treatment:                                                DATE: 05/13/22 Therapeutic Exercise: Demonstrated and issue initial HEP.  Therapeutic Activity: Education on assessment findings that will be addressed throughout duration of POC.     PATIENT EDUCATION:  Education details: HEP update Person educated: Patient  Education method: Explanation, Demonstration, Tactile cues, Verbal cues, and Handouts Education comprehension: Verbalized understanding, returned demonstration, Verbal cues required, tactile cues required, and needs further education   HOME EXERCISE PROGRAM: Access Code: [email or web address]    ASSESSMENT: CLINICAL IMPRESSION: Patient tolerated therapy well with no adverse effects. Therapy focused on progression of  ankle mobility and strength with good tolerance. Began closed chain stretching and strengthening with min c/o pain with soleus stretch. Trial of ionophoresis patch to point tenderness at lateral left ankle. Will assess response.  Patient would benefit from continued skilled PT to progress her mobility and strength in order to reduce pain and maximize functional ability.     OBJECTIVE IMPAIRMENTS Abnormal gait, decreased activity tolerance, decreased balance, difficulty walking, decreased ROM, decreased strength, increased edema, improper body mechanics, and pain.    ACTIVITY LIMITATIONS carrying, lifting, standing, squatting, stairs, and locomotion level   PARTICIPATION LIMITATIONS: meal prep, cleaning, laundry, shopping, community activity, occupation, and yard work   PERSONAL FACTORS Age, Profession, and Time since onset of injury/illness/exacerbation are also affecting patient's functional outcome.      GOALS: Goals reviewed with patient?  No   SHORT TERM GOALS: Target date: 06/03/2022   Patient will be independent and compliant with initial HEP.  Baseline:  issued at eval  Goal status: INITIAL   2.  Patient will be able to perform SL calf raise on the LLE to improve gait mechanics.  Baseline: unable  Goal status: INITIAL   3.  Patient will demonstrate proper lifting mechanics without an increase in Lt foot pain.  Baseline: see above  Goal status: INITIAL   LONG TERM GOALS: Target date: 06/24/2022    Patient will demonstrate at least 4/5 Lt ankle inversion and eversion strength to improve her gait stability.   Baseline: see above  Goal status: INITIAL   2.  Patient will maintain SLS on the LLE for at least 15 seconds to improve her stability with walking on uneven terrain.  Baseline: see above  Goal status: INITIAL   3.  Patient will report pain at worst rated as 5/10 to improve her tolerance to walking and standing activity.  Baseline: see above  Goal status: INITIAL     PLAN: PT FREQUENCY: 2x/week   PT DURATION: 6 weeks   PLANNED INTERVENTIONS: Therapeutic exercises, Therapeutic activity, Neuromuscular re-education, Balance training, Gait training, Patient/Family education, Self Care, Joint mobilization, Stair training, Dry Needling, Electrical stimulation, Cryotherapy, Moist heat, Vasopneumatic device, Ultrasound, Ionotophoresis 4mg /ml Dexamethasone, Manual therapy, and Re-evaluation   PLAN FOR NEXT SESSION: assess response to IONTO and repeat prn. review/update HEP PRN, progress ankle strengthening as tolerated, manual to peroneals/plantar fascia, foot intrinsic strengthening     , PTA 05/23/22 1:12 PM Phone: [phone] Fax: [phone]

## 2022-05-26 NOTE — Therapy (Signed)
OUTPATIENT PHYSICAL THERAPY TREATMENT NOTE   Patient Name: Crystal Whitaker MRN: [id number] DOB:20-Apr-1966, 56 y.o., female 28 Date: 05/27/2022  PCP: Marcine Matar, MD   REFERRING PROVIDER: Edwin Cap, DPM    END OF SESSION:   PT End of Session - 05/27/22 1135     Visit Number 4    Number of Visits 13    Date for PT Re-Evaluation 06/28/22    Authorization Type [employer]    PT Start Time 1146    PT Stop Time 1228    PT Time Calculation (min) 42 min    Activity Tolerance Patient tolerated treatment well    Behavior During Therapy WFL for tasks assessed/performed              Past Medical History:  Diagnosis Date   Back pain    Hypertension    Sinusitis    Past Surgical History:  Procedure Laterality Date   CESAREAN SECTION     Patient Active Problem List   Diagnosis Date Noted   Elevated blood pressure reading in office with diagnosis of hypertension 08/06/2021   Mixed hyperlipidemia 08/06/2021   Headache(784.0) 02/22/2014   Knee pain, acute 04/21/2013   Essential hypertension 12/15/2008   DEGENERATIVE JOINT DISEASE, SHOULDER 12/15/2008   LOW BACK PAIN SYNDROME 08/02/2007    REFERRING DIAG: Plantar fasciitis of left foot, Left peroneal tendinosis   THERAPY DIAG:  Pain in left ankle and joints of left foot  Muscle weakness (generalized)  Difficulty in walking, not elsewhere classified  Localized edema  Rationale for Evaluation and Treatment Rehabilitation  PERTINENT HISTORY: None  PRECAUTIONS: None   SUBJECTIVE: Patient reports her foot continues to feel better. She reports the ionto patch helped to reduce her pain.   Offered patient video interpreter but she denied.  PAIN:  Are you having pain? Yes:  NPRS scale: 8/10 Pain location: Left lateral ankle Pain description: unable to describe; intermittent Aggravating factors: first thing in the morning, walking, standing Relieving factors: rest, medication  PATIENT GOALS "feel  better"   OBJECTIVE: (objective measures completed at initial evaluation unless otherwise dated) EDEMA:  Mild non-pitting edema about the Lt lateral ankle    PALPATION: TTP    LOWER EXTREMITY ROM:   Active ROM Right eval Left eval Rt / Lt 05/20/2022  Hip flexion       Hip extension       Hip abduction       Hip adduction       Hip internal rotation       Hip external rotation       Knee flexion       Knee extension       Ankle dorsiflexion 5 2 8  / 6 deg  Ankle plantarflexion WNL WNL   Ankle inversion WNL WNL pn!   Ankle eversion WNL WNL pn!    (Blank rows = not tested)   LOWER EXTREMITY MMT:   MMT Right eval Left eval  Hip flexion      Hip extension      Hip abduction      Hip adduction      Hip internal rotation      Hip external rotation      Knee flexion      Knee extension      Ankle dorsiflexion 5 5  Ankle plantarflexion Able to complete full range calf raise  Unable to complete calf raise   Ankle inversion 5 4-  Ankle eversion 5 3+  pain   (Blank rows = not tested)   LOWER EXTREMITY SPECIAL TESTS:  N/A   FUNCTIONAL TESTS:  SLS 7 seconds LLE; 30 seconds RLE  Lifting: no hip or knee flexion; completes by flexing only at the lumbar spine; pain    GAIT: Distance walked: 20 ft  Assistive device utilized: None Level of assistance: Complete Independence Comments: excessive pronation; decreased push off on the LLE       [facility] Adult PT Treatment:                                                DATE: 05/27/22 Therapeutic Exercise: NuStep level 5 x 5 minutes UE/LE Gastroc and soleus stretch with strap 2 x 30 sec each  [place] pickups x 15 each  Ankle rockerboard A/P and lateral 2 x 10 each  Seated toe raise 2 x 10  Seated calf raise with 10 lb dumbbell 2 x 15  Resisted ankle inversion red band 2 x 10  Resisted ankle eversion red band 2 x 10   Modalities: Iontophoresis  1 mL dex 4-6 hours, slow release patch, left lateral ankle  [facility] Adult PT Treatment:                                                 DATE: 05/23/2022 Therapeutic Exercise: NuStep L4 x 5 min with UE/LE while taking subjective Runners stretch , soleus stretch Wooden rocker board x 1 minute Bilateral heel raise x 15 Slant board stretch SLS 30 sec without UE Left  3-way banded ankle with 2 x 15 red Seated towel scrunches 2 x 20 Modalities: Iontophoresis  1 mL dex 4-6 hours, slow release patch, left lateral ankle   [facility] Adult PT Treatment:                                                DATE: 05/20/2022 Therapeutic Exercise: NuStep L4 x 5 min with UE/LE while taking subjective Longsitting calf stretch with strap 2 x 30 sec each 4-way banded ankle with yellow 2 x 20 (PF with blue) Seated towel scrunches 2 x 20 Seated towel slide inversion and eversion with 2# 2 x 3 each Instructed on self banded eversion with yellow for HEP     PATIENT EDUCATION:  Education details: ionto; ice for swelling/pain Person educated: patient Education method: instruction Education comprehension: verbalized understanding   HOME EXERCISE PROGRAM: Access Code: [email or web address]    ASSESSMENT: CLINICAL IMPRESSION: Patient continues to report high pain level about the Lt ankle, though in no obvious distress throughout session; question language barrier for accurate numeric pain scale assessment as she reports overall improvement in her pain and based upon FACES scale she does not exhibit pain over a 2/10 during session. Continued to progress ankle strengthening and mobility, which she tolerated well without an increase in pain. Iontophoresis was completed again as she reports this did help reduce her pain last session.      OBJECTIVE IMPAIRMENTS Abnormal gait, decreased activity tolerance, decreased balance, difficulty walking, decreased ROM, decreased strength, increased edema, improper body mechanics, and pain.  ACTIVITY LIMITATIONS carrying, lifting, standing, squatting, stairs, and locomotion level    PARTICIPATION LIMITATIONS: meal prep, cleaning, laundry, shopping, community activity, occupation, and yard work   PERSONAL FACTORS Age, Profession, and Time since onset of injury/illness/exacerbation are also affecting patient's functional outcome.      GOALS: Goals reviewed with patient? No   SHORT TERM GOALS: Target date: 06/03/2022   Patient will be independent and compliant with initial HEP.  Baseline: issued at eval  Goal status: INITIAL   2.  Patient will be able to perform SL calf raise on the LLE to improve gait mechanics.  Baseline: unable  Goal status: INITIAL   3.  Patient will demonstrate proper lifting mechanics without an increase in Lt foot pain.  Baseline: see above  Goal status: INITIAL   LONG TERM GOALS: Target date: 06/24/2022    Patient will demonstrate at least 4/5 Lt ankle inversion and eversion strength to improve her gait stability.   Baseline: see above  Goal status: INITIAL   2.  Patient will maintain SLS on the LLE for at least 15 seconds to improve her stability with walking on uneven terrain.  Baseline: see above  Goal status: INITIAL   3.  Patient will report pain at worst rated as 5/10 to improve her tolerance to walking and standing activity.  Baseline: see above  Goal status: INITIAL     PLAN: PT FREQUENCY: 2x/week   PT DURATION: 6 weeks   PLANNED INTERVENTIONS: Therapeutic exercises, Therapeutic activity, Neuromuscular re-education, Balance training, Gait training, Patient/Family education, Self Care, Joint mobilization, Stair training, Dry Needling, Electrical stimulation, Cryotherapy, Moist heat, Vasopneumatic device, Ultrasound, Ionotophoresis 4mg /ml Dexamethasone, Manual therapy, and Re-evaluation   PLAN FOR NEXT SESSION: . review/update HEP PRN, progress ankle strengthening as tolerated, manual to peroneals/plantar fascia, foot intrinsic strengthening; ionto prn  , PT, DPT, ATC 05/27/22 12:31 PM

## 2022-05-27 ENCOUNTER — Ambulatory Visit: Payer: Commercial Managed Care - HMO

## 2022-05-27 DIAGNOSIS — R262 Difficulty in walking, not elsewhere classified: Secondary | ICD-10-CM

## 2022-05-27 DIAGNOSIS — M25572 Pain in left ankle and joints of left foot: Secondary | ICD-10-CM | POA: Diagnosis not present

## 2022-05-27 DIAGNOSIS — M6281 Muscle weakness (generalized): Secondary | ICD-10-CM

## 2022-05-27 DIAGNOSIS — R600 Localized edema: Secondary | ICD-10-CM

## 2022-05-29 ENCOUNTER — Other Ambulatory Visit: Payer: Self-pay

## 2022-05-29 ENCOUNTER — Ambulatory Visit: Payer: Commercial Managed Care - HMO | Admitting: Physical Therapy

## 2022-05-29 ENCOUNTER — Encounter: Payer: Self-pay | Admitting: Physical Therapy

## 2022-05-29 DIAGNOSIS — M6281 Muscle weakness (generalized): Secondary | ICD-10-CM

## 2022-05-29 DIAGNOSIS — R600 Localized edema: Secondary | ICD-10-CM

## 2022-05-29 DIAGNOSIS — R262 Difficulty in walking, not elsewhere classified: Secondary | ICD-10-CM

## 2022-05-29 DIAGNOSIS — M25572 Pain in left ankle and joints of left foot: Secondary | ICD-10-CM | POA: Diagnosis not present

## 2022-05-29 NOTE — Therapy (Signed)
OUTPATIENT PHYSICAL THERAPY TREATMENT NOTE   Patient Name: Crystal Whitaker MRN: [id number] DOB:February 07, 1966, 56 y.o., female Today's Date: 05/29/2022  PCP: Marcine Matar, MD   REFERRING PROVIDER: Edwin Cap, DPM    END OF SESSION:   PT End of Session - 05/29/22 1121     Visit Number 5    Number of Visits 13    Date for PT Re-Evaluation 06/28/22    Authorization Type [employer]    PT Start Time 1130    PT Stop Time 1210    PT Time Calculation (min) 40 min    Activity Tolerance Patient tolerated treatment well    Behavior During Therapy WFL for tasks assessed/performed               Past Medical History:  Diagnosis Date   Back pain    Hypertension    Sinusitis    Past Surgical History:  Procedure Laterality Date   CESAREAN SECTION     Patient Active Problem List   Diagnosis Date Noted   Elevated blood pressure reading in office with diagnosis of hypertension 08/06/2021   Mixed hyperlipidemia 08/06/2021   Headache(784.0) 02/22/2014   Knee pain, acute 04/21/2013   Essential hypertension 12/15/2008   DEGENERATIVE JOINT DISEASE, SHOULDER 12/15/2008   LOW BACK PAIN SYNDROME 08/02/2007    REFERRING DIAG: Plantar fasciitis of left foot, Left peroneal tendinosis   THERAPY DIAG:  Pain in left ankle and joints of left foot  Muscle weakness (generalized)  Difficulty in walking, not elsewhere classified  Localized edema  Rationale for Evaluation and Treatment Rehabilitation  PERTINENT HISTORY: None  PRECAUTIONS: None   SUBJECTIVE: Patient reports her foot continues to feel better. She reports the ionto patch helped to reduce her pain.   Offered patient video interpreter but she denied.  PAIN:  Are you having pain? Yes:  NPRS scale: 8/10 Pain location: Left lateral ankle Pain description: unable to describe; intermittent Aggravating factors: first thing in the morning, walking, standing Relieving factors: rest, medication  PATIENT GOALS "feel  better"   OBJECTIVE: (objective measures completed at initial evaluation unless otherwise dated) EDEMA:  Mild non-pitting edema about the Lt lateral ankle    PALPATION: TTP    LOWER EXTREMITY ROM:   Active ROM Right eval Left eval Rt / Lt 05/20/2022  Hip flexion       Hip extension       Hip abduction       Hip adduction       Hip internal rotation       Hip external rotation       Knee flexion       Knee extension       Ankle dorsiflexion 5 2 8  / 6 deg  Ankle plantarflexion WNL WNL   Ankle inversion WNL WNL pn!   Ankle eversion WNL WNL pn!    (Blank rows = not tested)   LOWER EXTREMITY MMT:   MMT Right eval Left eval Left 05/29/2022  Hip flexion       Hip extension       Hip abduction       Hip adduction       Hip internal rotation       Hip external rotation       Knee flexion       Knee extension       Ankle dorsiflexion 5 5 5   Ankle plantarflexion Able to complete full range calf raise  Unable to complete calf  raise  4  Ankle inversion 5 4- 4  Ankle eversion 5 3+ pain 4   (Blank rows = not tested)   LOWER EXTREMITY SPECIAL TESTS:  N/A   FUNCTIONAL TESTS:  SLS 7 seconds LLE; 30 seconds RLE  Lifting: no hip or knee flexion; completes by flexing only at the lumbar spine; pain    GAIT: Distance walked: 20 ft  Assistive device utilized: None Level of assistance: Complete Independence Comments: excessive pronation; decreased push off on the LLE     TODAY'S TREATMENT [facility] Adult PT Treatment:                                                DATE: 05/29/22 Therapeutic Exercise: NuStep level 5 x 5 minutes UE/LE while taking subjective Slant board calf stretch 3 x 30 sec  Rockerboard A/P taps 2 x 1 min - requires UE support Standing heel raises 2 x 20 Tandem stance 2 x 30 sec each 4-way ankle with red 2 x 20 Seated marble pick-up with cup placed laterally to encourage eversion x 5 rounds   [facility] Adult PT Treatment:                                                 DATE: 05/27/22 Therapeutic Exercise: NuStep level 5 x 5 minutes UE/LE Gastroc and soleus stretch with strap 2 x 30 sec each  Marble pickups x 15 each  Ankle rockerboard A/P and lateral 2 x 10 each  Seated toe raise 2 x 10  Seated calf raise with 10 lb dumbbell 2 x 15  Resisted ankle inversion red band 2 x 10  Resisted ankle eversion red band 2 x 10  Modalities: Iontophoresis  1 mL dex 4-6 hours, slow release patch, left lateral ankle  [facility] Adult PT Treatment:                                                DATE: 05/23/2022 Therapeutic Exercise: NuStep L4 x 5 min with UE/LE while taking subjective Runners stretch , soleus stretch Wooden rocker board x 1 minute Bilateral heel raise x 15 Slant board stretch SLS 30 sec without UE Left  3-way banded ankle with 2 x 15 red Seated towel scrunches 2 x 20 Modalities: Iontophoresis  1 mL dex 4-6 hours, slow release patch, left lateral ankle  PATIENT EDUCATION:  Education details: HEP Person educated: Patient Education method: Engineer, structural Education comprehension: Verbalized understanding    HOME EXERCISE PROGRAM: Access Code: [email or web address]    ASSESSMENT: CLINICAL IMPRESSION: Patient tolerated therapy well with no adverse effects. Therapy focused on continued stretching for ankle mobility and strengthening with progression to more weight bearing exercises. Also incorporated some balance and ankle stability stability this visit with good tolerance. She does demonstrate improved ankle strength this visit but did report increased lateral ankle pain with eversion exercise. Overall she reports improvement in her left ankle pain. No changes made to HEP this visit. Patient would benefit from continued skilled PT to progress her mobility and strength in order to reduce pain and maximize functional ability.  OBJECTIVE IMPAIRMENTS Abnormal gait, decreased activity tolerance, decreased balance, difficulty walking, decreased ROM, decreased  strength, increased edema, improper body mechanics, and pain.    ACTIVITY LIMITATIONS carrying, lifting, standing, squatting, stairs, and locomotion level   PARTICIPATION LIMITATIONS: meal prep, cleaning, laundry, shopping, community activity, occupation, and yard work   PERSONAL FACTORS Age, Profession, and Time since onset of injury/illness/exacerbation are also affecting patient's functional outcome.      GOALS: Goals reviewed with patient? No   SHORT TERM GOALS: Target date: 06/03/2022   Patient will be independent and compliant with initial HEP.  Baseline: issued at eval  Goal status: INITIAL   2.  Patient will be able to perform SL calf raise on the LLE to improve gait mechanics.  Baseline: unable  Goal status: INITIAL   3.  Patient will demonstrate proper lifting mechanics without an increase in Lt foot pain.  Baseline: see above  Goal status: INITIAL   LONG TERM GOALS: Target date: 06/24/2022    Patient will demonstrate at least 4/5 Lt ankle inversion and eversion strength to improve her gait stability.   Baseline: see above  Goal status: INITIAL   2.  Patient will maintain SLS on the LLE for at least 15 seconds to improve her stability with walking on uneven terrain.  Baseline: see above  Goal status: INITIAL   3.  Patient will report pain at worst rated as 5/10 to improve her tolerance to walking and standing activity.  Baseline: see above  Goal status: INITIAL     PLAN: PT FREQUENCY: 2x/week   PT DURATION: 6 weeks   PLANNED INTERVENTIONS: Therapeutic exercises, Therapeutic activity, Neuromuscular re-education, Balance training, Gait training, Patient/Family education, Self Care, Joint mobilization, Stair training, Dry Needling, Electrical stimulation, Cryotherapy, Moist heat, Vasopneumatic device, Ultrasound, Ionotophoresis 4mg /ml Dexamethasone, Manual therapy, and Re-evaluation   PLAN FOR NEXT SESSION: . review/update HEP PRN, progress ankle strengthening as  tolerated, manual to peroneals/plantar fascia, foot intrinsic strengthening; ionto prn   , PT, DPT, LAT, ATC 05/29/22  12:21 PM Phone: [phone] Fax: [phone]

## 2022-06-02 NOTE — Therapy (Signed)
OUTPATIENT PHYSICAL THERAPY TREATMENT NOTE   Patient Name: Crystal Whitaker MRN: [id number] DOB:07-18-1966, 56 y.o., female Today's Date: 06/03/2022  PCP: Marcine Matar, MD   REFERRING PROVIDER: Edwin Cap, DPM    END OF SESSION:   PT End of Session - 06/03/22 1144     Visit Number 6    Number of Visits 13    Date for PT Re-Evaluation 06/28/22    Authorization Type [employer]    PT Start Time 1145    PT Stop Time 1225    PT Time Calculation (min) 40 min    Activity Tolerance Patient tolerated treatment well    Behavior During Therapy WFL for tasks assessed/performed                Past Medical History:  Diagnosis Date   Back pain    Hypertension    Sinusitis    Past Surgical History:  Procedure Laterality Date   CESAREAN SECTION     Patient Active Problem List   Diagnosis Date Noted   Elevated blood pressure reading in office with diagnosis of hypertension 08/06/2021   Mixed hyperlipidemia 08/06/2021   Headache(784.0) 02/22/2014   Knee pain, acute 04/21/2013   Essential hypertension 12/15/2008   DEGENERATIVE JOINT DISEASE, SHOULDER 12/15/2008   LOW BACK PAIN SYNDROME 08/02/2007    REFERRING DIAG: Plantar fasciitis of left foot, Left peroneal tendinosis   THERAPY DIAG:  Pain in left ankle and joints of left foot  Muscle weakness (generalized)  Difficulty in walking, not elsewhere classified  Localized edema  Rationale for Evaluation and Treatment Rehabilitation  PERTINENT HISTORY: None  PRECAUTIONS: None   SUBJECTIVE: Patient reports "a little bit" of pain currently. She reports compliance with HEP. She reports overall her pain is improving since she started PT.   Offered patient video interpreter but she denied.  PAIN:  Are you having pain? Yes:  NPRS scale: 5/10 Pain location: Left lateral ankle Pain description: unable to describe; intermittent Aggravating factors: first thing in the morning, walking, standing Relieving factors:  rest, medication  PATIENT GOALS "feel better"   OBJECTIVE: (objective measures completed at initial evaluation unless otherwise dated) EDEMA:  Mild non-pitting edema about the Lt lateral ankle    PALPATION: TTP    LOWER EXTREMITY ROM:   Active ROM Right eval Left eval Rt / Lt 05/20/2022  Hip flexion       Hip extension       Hip abduction       Hip adduction       Hip internal rotation       Hip external rotation       Knee flexion       Knee extension       Ankle dorsiflexion 5 2 8  / 6 deg  Ankle plantarflexion WNL WNL   Ankle inversion WNL WNL pn!   Ankle eversion WNL WNL pn!    (Blank rows = not tested)   LOWER EXTREMITY MMT:   MMT Right eval Left eval Left 05/29/2022 06/03/22  Hip flexion        Hip extension        Hip abduction        Hip adduction        Hip internal rotation        Hip external rotation        Knee flexion        Knee extension        Ankle dorsiflexion 5 5  5   Ankle plantarflexion Able to complete full range calf raise  Unable to complete calf raise  4 Lt: partial range SL calf raise, increased pain  Ankle inversion 5 4- 4   Ankle eversion 5 3+ pain 4    (Blank rows = not tested)   LOWER EXTREMITY SPECIAL TESTS:  N/A   FUNCTIONAL TESTS:  SLS 7 seconds LLE; 30 seconds RLE  Lifting: no hip or knee flexion; completes by flexing only at the lumbar spine; pain    GAIT: Distance walked: 20 ft  Assistive device utilized: None Level of assistance: Complete Independence Comments: excessive pronation; decreased push off on the LLE     TODAY'S TREATMENT [facility] Adult PT Treatment:                                                DATE: 06/03/22 Therapeutic Exercise: NuStep level 5 x 5 minutes  Calf stretch on wedge x 60 seconds  Standing calf raise 2 x 10 Standing toe raise 2 x 10  Mini squat with UE support attempted Wall squat partial range 2 x 10  Lateral step ups on airex 2 x 10  Romberg eyes closed on airex 3 x 30 seconds  Tandem  on airex 3 x 30 sec each  Standing rockerboard A/P 2 x 1 minute   [facility] Adult PT Treatment:                                                DATE: 05/29/22 Therapeutic Exercise: NuStep level 5 x 5 minutes UE/LE while taking subjective Slant board calf stretch 3 x 30 sec  Rockerboard A/P taps 2 x 1 min - requires UE support Standing heel raises 2 x 20 Tandem stance 2 x 30 sec each 4-way ankle with red 2 x 20 Seated marble pick-up with cup placed laterally to encourage eversion x 5 rounds   [facility] Adult PT Treatment:                                                DATE: 05/27/22 Therapeutic Exercise: NuStep level 5 x 5 minutes UE/LE Gastroc and soleus stretch with strap 2 x 30 sec each  Marble pickups x 15 each  Ankle rockerboard A/P and lateral 2 x 10 each  Seated toe raise 2 x 10  Seated calf raise with 10 lb dumbbell 2 x 15  Resisted ankle inversion red band 2 x 10  Resisted ankle eversion red band 2 x 10  Modalities: Iontophoresis  1 mL dex 4-6 hours, slow release patch, left lateral ankle    PATIENT EDUCATION:  Education details: N/A Person educated: N/A Education method: N/A Education comprehension: N/A  HOME EXERCISE PROGRAM: Access Code: [email or web address]    ASSESSMENT: CLINICAL IMPRESSION: Patient tolerated session well today with continued progression of standing strengthening and balance activity. Worked on squatting mechanics today with patient unable to perform mini bodyweight squat with BUE support with proper form even with heavy verbal, tactile, and visual cues she is unable to coordinate hip and knee flexion. She is better able to  perform partial range wall squat, but still requires heavy cues to perform correctly. With static balance activity she has occasional LOB with tandem stance on unstable surface requiring use of reaching strategy to maintain balance. She reports occasional lateral ankle pain with static balance activity, otherwise no reports of increased ankle pain.       OBJECTIVE IMPAIRMENTS Abnormal gait, decreased activity tolerance, decreased balance, difficulty walking, decreased ROM, decreased strength, increased edema, improper body mechanics, and pain.    ACTIVITY LIMITATIONS carrying, lifting, standing, squatting, stairs, and locomotion level   PARTICIPATION LIMITATIONS: meal prep, cleaning, laundry, shopping, community activity, occupation, and yard work   PERSONAL FACTORS Age, Profession, and Time since onset of injury/illness/exacerbation are also affecting patient's functional outcome.      GOALS: Goals reviewed with patient? No   SHORT TERM GOALS: Target date: 06/03/2022   Patient will be independent and compliant with initial HEP.  Baseline: issued at eval  Goal status: achieved    2.  Patient will be able to perform SL calf raise on the LLE to improve gait mechanics.  Baseline: unable  Goal status: ongoing    3.  Patient will demonstrate proper lifting mechanics without an increase in Lt foot pain.  Baseline: see above  Goal status: ongoing; progressing as appropriate    LONG TERM GOALS: Target date: 06/24/2022    Patient will demonstrate at least 4/5 Lt ankle inversion and eversion strength to improve her gait stability.   Baseline: see above  Goal status: INITIAL   2.  Patient will maintain SLS on the LLE for at least 15 seconds to improve her stability with walking on uneven terrain.  Baseline: see above  Goal status: INITIAL   3.  Patient will report pain at worst rated as 5/10 to improve her tolerance to walking and standing activity.  Baseline: see above  Goal status: INITIAL     PLAN: PT FREQUENCY: 2x/week   PT DURATION: 6 weeks   PLANNED INTERVENTIONS: Therapeutic exercises, Therapeutic activity, Neuromuscular re-education, Balance training, Gait training, Patient/Family education, Self Care, Joint mobilization, Stair training, Dry Needling, Electrical stimulation, Cryotherapy, Moist heat, Vasopneumatic device,  Ultrasound, Ionotophoresis 4mg /ml Dexamethasone, Manual therapy, and Re-evaluation   PLAN FOR NEXT SESSION: . review/update HEP PRN, progress ankle strengthening as tolerated, manual to peroneals/plantar fascia, foot intrinsic strengthening; ionto prn  , PT, DPT, ATC 06/03/22 12:25 PM

## 2022-06-03 ENCOUNTER — Ambulatory Visit: Payer: Commercial Managed Care - HMO

## 2022-06-03 DIAGNOSIS — R600 Localized edema: Secondary | ICD-10-CM

## 2022-06-03 DIAGNOSIS — R262 Difficulty in walking, not elsewhere classified: Secondary | ICD-10-CM

## 2022-06-03 DIAGNOSIS — M6281 Muscle weakness (generalized): Secondary | ICD-10-CM

## 2022-06-03 DIAGNOSIS — M25572 Pain in left ankle and joints of left foot: Secondary | ICD-10-CM

## 2022-06-06 ENCOUNTER — Ambulatory Visit: Payer: Commercial Managed Care - HMO

## 2022-06-06 DIAGNOSIS — M25572 Pain in left ankle and joints of left foot: Secondary | ICD-10-CM | POA: Diagnosis not present

## 2022-06-06 DIAGNOSIS — R600 Localized edema: Secondary | ICD-10-CM

## 2022-06-06 DIAGNOSIS — R262 Difficulty in walking, not elsewhere classified: Secondary | ICD-10-CM

## 2022-06-06 DIAGNOSIS — M6281 Muscle weakness (generalized): Secondary | ICD-10-CM

## 2022-06-06 NOTE — Therapy (Signed)
OUTPATIENT PHYSICAL THERAPY TREATMENT NOTE   Patient Name: Crystal Whitaker MRN: [id number] DOB:[birth year], 56 y.o., female Today's Date: 06/06/2022  PCP: Marcine Matar, MD   REFERRING PROVIDER: Edwin Cap, DPM    END OF SESSION:   PT End of Session - 06/06/22 1145     Visit Number 7    Number of Visits 13    Date for PT Re-Evaluation 06/28/22    Authorization Type [employer]    PT Start Time 1145    PT Stop Time 1225    PT Time Calculation (min) 40 min    Activity Tolerance Patient tolerated treatment well    Behavior During Therapy WFL for tasks assessed/performed                Past Medical History:  Diagnosis Date   Back pain    Hypertension    Sinusitis    Past Surgical History:  Procedure Laterality Date   CESAREAN SECTION     Patient Active Problem List   Diagnosis Date Noted   Elevated blood pressure reading in office with diagnosis of hypertension 08/06/2021   Mixed hyperlipidemia 08/06/2021   Headache(784.0) 02/22/2014   Knee pain, acute 04/21/2013   Essential hypertension 12/15/2008   DEGENERATIVE JOINT DISEASE, SHOULDER 12/15/2008   LOW BACK PAIN SYNDROME 08/02/2007    REFERRING DIAG: Plantar fasciitis of left foot, Left peroneal tendinosis   THERAPY DIAG:  Pain in left ankle and joints of left foot  Muscle weakness (generalized)  Difficulty in walking, not elsewhere classified  Localized edema  Rationale for Evaluation and Treatment Rehabilitation  PERTINENT HISTORY: None  PRECAUTIONS: None   SUBJECTIVE: Patient reports still having pain in the same area of the ankle. She reports compliance with HEP.   Offered patient video interpreter but she denied.  PAIN:  Are you having pain? Yes:  NPRS scale: 5/10 Pain location: Left lateral ankle Pain description: unable to describe; intermittent Aggravating factors: first thing in the morning, walking, standing Relieving factors: rest, medication  PATIENT GOALS "feel  better"   OBJECTIVE: (objective measures completed at initial evaluation unless otherwise dated) EDEMA:  Mild non-pitting edema about the Lt lateral ankle    PALPATION: TTP    LOWER EXTREMITY ROM:   Active ROM Right eval Left eval Rt / Lt 05/20/2022  Hip flexion       Hip extension       Hip abduction       Hip adduction       Hip internal rotation       Hip external rotation       Knee flexion       Knee extension       Ankle dorsiflexion 5 2 8  / 6 deg  Ankle plantarflexion WNL WNL   Ankle inversion WNL WNL pn!   Ankle eversion WNL WNL pn!    (Blank rows = not tested)   LOWER EXTREMITY MMT:   MMT Right eval Left eval Left 05/29/2022 06/03/22  Hip flexion        Hip extension        Hip abduction        Hip adduction        Hip internal rotation        Hip external rotation        Knee flexion        Knee extension        Ankle dorsiflexion 5 5 5    Ankle plantarflexion Able to  complete full range calf raise  Unable to complete calf raise  4 Lt: partial range SL calf raise, increased pain  Ankle inversion 5 4- 4   Ankle eversion 5 3+ pain 4    (Blank rows = not tested)   LOWER EXTREMITY SPECIAL TESTS:  N/A   FUNCTIONAL TESTS:  SLS 7 seconds LLE; 30 seconds RLE  Lifting: no hip or knee flexion; completes by flexing only at the lumbar spine; pain    GAIT: Distance walked: 20 ft  Assistive device utilized: None Level of assistance: Complete Independence Comments: excessive pronation; decreased push off on the LLE     TODAY'S TREATMENT [facility] Adult PT Treatment:                                                DATE: 06/06/22 Therapeutic Exercise: NuStep level 5 x 5 minutes  Calf stretch on wedge x 60 sec  Leg press 2 x 10; 20 lbs  Wall squats 2 x 10  Standing calf raise 2 x 10  Standing toe raise 2 x 10  Body weight squats 1 x 10  Ankle rockerboard 2 x 10 A/P no UE support  4 way ankle red 1 x 10  Ankle circles CW/CCW 1 x 10 each  Updated HEP  [facility]  Adult PT Treatment:                                                DATE: 06/03/22 Therapeutic Exercise: NuStep level 5 x 5 minutes  Calf stretch on wedge x 60 seconds  Standing calf raise 2 x 10 Standing toe raise 2 x 10  Mini squat with UE support attempted Wall squat partial range 2 x 10  Lateral step ups on airex 2 x 10  Romberg eyes closed on airex 3 x 30 seconds  Tandem on airex 3 x 30 sec each  Standing rockerboard A/P 2 x 1 minute   [facility] Adult PT Treatment:                                                DATE: 05/29/22 Therapeutic Exercise: NuStep level 5 x 5 minutes UE/LE while taking subjective Slant board calf stretch 3 x 30 sec  Rockerboard A/P taps 2 x 1 min - requires UE support Standing heel raises 2 x 20 Tandem stance 2 x 30 sec each 4-way ankle with red 2 x 20 Seated marble pick-up with cup placed laterally to encourage eversion x 5 rounds      PATIENT EDUCATION:  Education details: HEP Person educated: patient Education method: Manufacturing engineer, cues, handout Education comprehension: returned demo, cues, needs further education   HOME EXERCISE PROGRAM: Access Code: [email or web address]    ASSESSMENT: CLINICAL IMPRESSION: Patient tolerated session well today focusing on CKC strengthening. Worked more on her squatting technique as she had extreme difficulty properly performing this movement at last session. She was able to complete leg press with good form without reports of ankle pain, but remains challenged in performing wall squats and body weight squats with UE support. With wall squats she  has initial difficulty keeping her back against the wall and has notable hip shift. With continued reps and cueing she is able to properly perform. Continues to be unable to coordinate body weight squat with UE support demonstrating either excessive anterior tibial translation or trunk flexion. Mild ankle pain reported with resisted ankle inversion and eversion, otherwise no reports of pain with  ther ex.      OBJECTIVE IMPAIRMENTS Abnormal gait, decreased activity tolerance, decreased balance, difficulty walking, decreased ROM, decreased strength, increased edema, improper body mechanics, and pain.    ACTIVITY LIMITATIONS carrying, lifting, standing, squatting, stairs, and locomotion level   PARTICIPATION LIMITATIONS: meal prep, cleaning, laundry, shopping, community activity, occupation, and yard work   PERSONAL FACTORS Age, Profession, and Time since onset of injury/illness/exacerbation are also affecting patient's functional outcome.      GOALS: Goals reviewed with patient? No   SHORT TERM GOALS: Target date: 06/03/2022   Patient will be independent and compliant with initial HEP.  Baseline: issued at eval  Goal status: achieved    2.  Patient will be able to perform SL calf raise on the LLE to improve gait mechanics.  Baseline: unable  Goal status: ongoing    3.  Patient will demonstrate proper lifting mechanics without an increase in Lt foot pain.  Baseline: see above  Goal status: ongoing; progressing as appropriate    LONG TERM GOALS: Target date: 06/24/2022    Patient will demonstrate at least 4/5 Lt ankle inversion and eversion strength to improve her gait stability.   Baseline: see above  Goal status: INITIAL   2.  Patient will maintain SLS on the LLE for at least 15 seconds to improve her stability with walking on uneven terrain.  Baseline: see above  Goal status: INITIAL   3.  Patient will report pain at worst rated as 5/10 to improve her tolerance to walking and standing activity.  Baseline: see above  Goal status: INITIAL     PLAN: PT FREQUENCY: 2x/week   PT DURATION: 6 weeks   PLANNED INTERVENTIONS: Therapeutic exercises, Therapeutic activity, Neuromuscular re-education, Balance training, Gait training, Patient/Family education, Self Care, Joint mobilization, Stair training, Dry Needling, Electrical stimulation, Cryotherapy, Moist heat,  Vasopneumatic device, Ultrasound, Ionotophoresis 4mg /ml Dexamethasone, Manual therapy, and Re-evaluation   PLAN FOR NEXT SESSION: . review/update HEP PRN, progress ankle strengthening as tolerated, manual to peroneals/plantar fascia, foot intrinsic strengthening  , PT, DPT, ATC 06/06/22 12:25 PM

## 2022-06-09 NOTE — Therapy (Signed)
OUTPATIENT PHYSICAL THERAPY TREATMENT NOTE   Patient Name: Crystal Whitaker MRN: [id number] DOB:12/18/1965, 56 y.o., female 38 Date: 06/10/2022  PCP: Ladell Pier, MD   REFERRING PROVIDER: Criselda Peaches, DPM    END OF SESSION:   PT End of Session - 06/10/22 1146     Visit Number 8    Number of Visits 13    Date for PT Re-Evaluation 06/28/22    Authorization Type [employer]    PT Start Time 1146    PT Stop Time 1226    PT Time Calculation (min) 40 min    Activity Tolerance Patient tolerated treatment well    Behavior During Therapy WFL for tasks assessed/performed                 Past Medical History:  Diagnosis Date   Back pain    Hypertension    Sinusitis    Past Surgical History:  Procedure Laterality Date   CESAREAN SECTION     Patient Active Problem List   Diagnosis Date Noted   Elevated blood pressure reading in office with diagnosis of hypertension 08/06/2021   Mixed hyperlipidemia 08/06/2021   Headache(784.0) 02/22/2014   Knee pain, acute 04/21/2013   Essential hypertension 12/15/2008   DEGENERATIVE JOINT DISEASE, SHOULDER 12/15/2008   LOW BACK PAIN SYNDROME 08/02/2007    REFERRING DIAG: Plantar fasciitis of left foot, Left peroneal tendinosis   THERAPY DIAG:  Pain in left ankle and joints of left foot  Muscle weakness (generalized)  Difficulty in walking, not elsewhere classified  Localized edema  Rationale for Evaluation and Treatment Rehabilitation  PERTINENT HISTORY: None  PRECAUTIONS: None   SUBJECTIVE: "A little pain right now." She reports compliance with HEP.   Declined interpreter   PAIN:  Are you having pain? Yes:  NPRS scale: 6/10 Pain location: Left lateral ankle Pain description: unable to describe; intermittent Aggravating factors: first thing in the morning, walking, standing Relieving factors: rest, medication  PATIENT GOALS "feel better"   OBJECTIVE: (objective measures completed at initial  evaluation unless otherwise dated) EDEMA:  Mild non-pitting edema about the Lt lateral ankle    PALPATION: TTP    LOWER EXTREMITY ROM:   Active ROM Right eval Left eval Rt / Lt 05/20/2022  Hip flexion       Hip extension       Hip abduction       Hip adduction       Hip internal rotation       Hip external rotation       Knee flexion       Knee extension       Ankle dorsiflexion '5 2 8 ' / 6 deg  Ankle plantarflexion WNL WNL   Ankle inversion WNL WNL pn!   Ankle eversion WNL WNL pn!    (Blank rows = not tested)   LOWER EXTREMITY MMT:   MMT Right eval Left eval Left 05/29/2022 06/03/22 06/10/22  Hip flexion         Hip extension         Hip abduction         Hip adduction         Hip internal rotation         Hip external rotation         Knee flexion         Knee extension         Ankle dorsiflexion '5 5 5    ' Ankle plantarflexion Able to  complete full range calf raise  Unable to complete calf raise  4 Lt: partial range SL calf raise, increased pain Lt: full range SL calf raise   Ankle inversion 5 4- 4    Ankle eversion 5 3+ pain 4     (Blank rows = not tested)   LOWER EXTREMITY SPECIAL TESTS:  N/A   FUNCTIONAL TESTS:  SLS 7 seconds LLE; 30 seconds RLE  Lifting: no hip or knee flexion; completes by flexing only at the lumbar spine; pain    GAIT: Distance walked: 20 ft  Assistive device utilized: None Level of assistance: Complete Independence Comments: excessive pronation; decreased push off on the LLE     TODAY'S TREATMENT [facility] Adult PT Treatment:                                                DATE: 06/10/22 Therapeutic Exercise: NuStep level 5 x 5 minutes  Calf stretch on wedge x 60 sec Heel walks 2 x 10 ft Toe walks attempted unable due to pain Tandem walks 4 x 10 ft  Eccentric calf raise LLE 2 x 10  Marble pickups 2 x 15 LLE Ankle circles 2 x 10; CW;CCW SL calf raise LLE 2 x 10  Resisted ankle DF with eversion (therapist resistance) 1 x 10  Resisted  ankle PF with eversion (therapist resistance) 1 x 10  Body weight squats with BUE 2 x 10   Self-care: discussed appropriate footwear to allow for improved arch/ankle support.   [facility] Adult PT Treatment:                                                DATE: 06/06/22 Therapeutic Exercise: NuStep level 5 x 5 minutes  Calf stretch on wedge x 60 sec  Leg press 2 x 10; 20 lbs  Wall squats 2 x 10  Standing calf raise 2 x 10  Standing toe raise 2 x 10  Body weight squats 1 x 10  Ankle rockerboard 2 x 10 A/P no UE support  4 way ankle red 1 x 10  Ankle circles CW/CCW 1 x 10 each  Updated HEP  [facility] Adult PT Treatment:                                                DATE: 06/03/22 Therapeutic Exercise: NuStep level 5 x 5 minutes  Calf stretch on wedge x 60 seconds  Standing calf raise 2 x 10 Standing toe raise 2 x 10  Mini squat with UE support attempted Wall squat partial range 2 x 10  Lateral step ups on airex 2 x 10  Romberg eyes closed on airex 3 x 30 seconds  Tandem on airex 3 x 30 sec each  Standing rockerboard A/P 2 x 1 minute        PATIENT EDUCATION:  Education details: see treatment Person educated: patient Education method: instruction  Education comprehension: verbalized understanding  HOME EXERCISE PROGRAM: Access Code: [email or web address]    ASSESSMENT: CLINICAL IMPRESSION:  Patient tolerated session well today with progression of ankle and foot intrinsic  strengthening. She reported increased lateral ankle pain when attempting toe walking, so this exercise was discontinued. She was able to complete full range SL calf raise on the LLE having met this short term functional goal. She continues to require heavy cues with squatting activity to allow for hip flexion with ability to perform a few squats with proper form. No reports of increased pain with targeted eversion strengthening today.    OBJECTIVE IMPAIRMENTS Abnormal gait, decreased activity tolerance, decreased balance,  difficulty walking, decreased ROM, decreased strength, increased edema, improper body mechanics, and pain.    ACTIVITY LIMITATIONS carrying, lifting, standing, squatting, stairs, and locomotion level   PARTICIPATION LIMITATIONS: meal prep, cleaning, laundry, shopping, community activity, occupation, and yard work   PERSONAL FACTORS Age, Profession, and Time since onset of injury/illness/exacerbation are also affecting patient's functional outcome.      GOALS: Goals reviewed with patient? No   SHORT TERM GOALS: Target date: 06/03/2022   Patient will be independent and compliant with initial HEP.  Baseline: issued at eval  Goal status: achieved    2.  Patient will be able to perform SL calf raise on the LLE to improve gait mechanics.  Baseline: unable  Goal status: achieved    3.  Patient will demonstrate proper lifting mechanics without an increase in Lt foot pain.  Baseline: see above  Goal status: ongoing; progressing as appropriate    LONG TERM GOALS: Target date: 06/24/2022    Patient will demonstrate at least 4/5 Lt ankle inversion and eversion strength to improve her gait stability.   Baseline: see above  Goal status: INITIAL   2.  Patient will maintain SLS on the LLE for at least 15 seconds to improve her stability with walking on uneven terrain.  Baseline: see above  Goal status: INITIAL   3.  Patient will report pain at worst rated as 5/10 to improve her tolerance to walking and standing activity.  Baseline: see above  Goal status: INITIAL     PLAN: PT FREQUENCY: 2x/week   PT DURATION: 6 weeks   PLANNED INTERVENTIONS: Therapeutic exercises, Therapeutic activity, Neuromuscular re-education, Balance training, Gait training, Patient/Family education, Self Care, Joint mobilization, Stair training, Dry Needling, Electrical stimulation, Cryotherapy, Moist heat, Vasopneumatic device, Ultrasound, Ionotophoresis 26m/ml Dexamethasone, Manual therapy, and Re-evaluation    PLAN FOR NEXT SESSION: . review/update HEP PRN, progress ankle strengthening as tolerated, manual to peroneals/plantar fascia, foot intrinsic strengthening  SGwendolyn Grant PT, DPT, ATC 06/10/22 12:29 PM

## 2022-06-10 ENCOUNTER — Ambulatory Visit: Payer: Commercial Managed Care - HMO

## 2022-06-10 DIAGNOSIS — M6281 Muscle weakness (generalized): Secondary | ICD-10-CM

## 2022-06-10 DIAGNOSIS — R600 Localized edema: Secondary | ICD-10-CM

## 2022-06-10 DIAGNOSIS — M25572 Pain in left ankle and joints of left foot: Secondary | ICD-10-CM

## 2022-06-10 DIAGNOSIS — R262 Difficulty in walking, not elsewhere classified: Secondary | ICD-10-CM

## 2022-06-13 ENCOUNTER — Ambulatory Visit: Payer: Commercial Managed Care - HMO

## 2022-06-16 ENCOUNTER — Other Ambulatory Visit: Payer: Self-pay

## 2022-06-16 ENCOUNTER — Encounter: Payer: Self-pay | Admitting: Physical Therapy

## 2022-06-16 ENCOUNTER — Ambulatory Visit: Payer: Commercial Managed Care - HMO | Admitting: Physical Therapy

## 2022-06-16 DIAGNOSIS — M25572 Pain in left ankle and joints of left foot: Secondary | ICD-10-CM | POA: Diagnosis not present

## 2022-06-16 DIAGNOSIS — R600 Localized edema: Secondary | ICD-10-CM

## 2022-06-16 DIAGNOSIS — R262 Difficulty in walking, not elsewhere classified: Secondary | ICD-10-CM

## 2022-06-16 DIAGNOSIS — M6281 Muscle weakness (generalized): Secondary | ICD-10-CM

## 2022-06-16 NOTE — Therapy (Signed)
OUTPATIENT PHYSICAL THERAPY TREATMENT NOTE   Patient Name: Crystal Whitaker MRN: [id number] DOB:04/19/1966, 56 y.o., female 69 Date: 06/16/2022  PCP: Marcine Matar, MD   REFERRING PROVIDER: Edwin Cap, DPM    END OF SESSION:   PT End of Session - 06/16/22 1130     Visit Number 9    Number of Visits 13    Date for PT Re-Evaluation 06/28/22    Authorization Type [employer]    PT Start Time 1130    PT Stop Time 1210    PT Time Calculation (min) 40 min    Activity Tolerance Patient tolerated treatment well    Behavior During Therapy WFL for tasks assessed/performed                  Past Medical History:  Diagnosis Date   Back pain    Hypertension    Sinusitis    Past Surgical History:  Procedure Laterality Date   CESAREAN SECTION     Patient Active Problem List   Diagnosis Date Noted   Elevated blood pressure reading in office with diagnosis of hypertension 08/06/2021   Mixed hyperlipidemia 08/06/2021   Headache(784.0) 02/22/2014   Knee pain, acute 04/21/2013   Essential hypertension 12/15/2008   DEGENERATIVE JOINT DISEASE, SHOULDER 12/15/2008   LOW BACK PAIN SYNDROME 08/02/2007    REFERRING DIAG: Plantar fasciitis of left foot, Left peroneal tendinosis   THERAPY DIAG:  Pain in left ankle and joints of left foot  Muscle weakness (generalized)  Difficulty in walking, not elsewhere classified  Localized edema  Rationale for Evaluation and Treatment Rehabilitation  PERTINENT HISTORY: None  PRECAUTIONS: None   SUBJECTIVE: Patient reports she is good today.  Declined interpreter   PAIN:  Are you having pain? Yes:  NPRS scale: 5/10 Pain location: Left lateral ankle Pain description: Unable to describe; intermittent Aggravating factors: First thing in the morning, walking, standing Relieving factors: rest, medication  PATIENT GOALS "feel better"   OBJECTIVE: (objective measures completed at initial evaluation unless otherwise  dated) EDEMA:  Mild non-pitting edema about the Lt lateral ankle    PALPATION: TTP    LOWER EXTREMITY ROM:   Active ROM Right eval Left eval Rt / Lt 05/20/2022  Hip flexion       Hip extension       Hip abduction       Hip adduction       Hip internal rotation       Hip external rotation       Knee flexion       Knee extension       Ankle dorsiflexion 5 2 8  / 6 deg  Ankle plantarflexion WNL WNL   Ankle inversion WNL WNL pn!   Ankle eversion WNL WNL pn!    (Blank rows = not tested)   LOWER EXTREMITY MMT:   MMT Right eval Left eval Left 05/29/2022 06/03/22 06/10/22 06/16/2022  Hip flexion          Hip extension          Hip abduction          Hip adduction          Hip internal rotation          Hip external rotation          Knee flexion          Knee extension          Ankle dorsiflexion 5 5 5  Ankle plantarflexion Able to complete full range calf raise  Unable to complete calf raise  4 Lt: partial range SL calf raise, increased pain Lt: full range SL calf raise    Ankle inversion 5 4- 4   4  Ankle eversion 5 3+ pain 4   4   (Blank rows = not tested)   LOWER EXTREMITY SPECIAL TESTS:  N/A   FUNCTIONAL TESTS:  SLS 7 seconds LLE; 30 seconds RLE   06/16/2022: SLS left 20 seconds Lifting: no hip or knee flexion; completes by flexing only at the lumbar spine; pain    GAIT: Distance walked: 20 ft  Assistive device utilized: None Level of assistance: Complete Independence Comments: excessive pronation; decreased push off on the LLE     TODAY'S TREATMENT [facility] Adult PT Treatment:                                                DATE: 06/16/22 Therapeutic Exercise: NuStep L5 x 5 minutes with UE/LE while taking subjective  Slant board calf stretch 3 x 30 seconds Heel raises 2 x 20 Left SL heel raises 2 x 10 Tandem stance 2 x 30 sec each SLS 3 x 20 sec each Heel walks at counter fwd/bwd x 2 lengths Toe walks at counter fwd/bwd x 2 lengths Tandem walk at counter  fwd/bwd x 2 lengths Side stepping at counter with green below knees x 4 lengths down/back 4-way ankle with green 2 x 20 each   [facility] Adult PT Treatment:                                                DATE: 06/10/22 Therapeutic Exercise: NuStep level 5 x 5 minutes  Calf stretch on wedge x 60 sec Heel walks 2 x 10 ft Toe walks attempted unable due to pain Tandem walks 4 x 10 ft  Eccentric calf raise LLE 2 x 10  Marble pickups 2 x 15 LLE Ankle circles 2 x 10; CW;CCW SL calf raise LLE 2 x 10  Resisted ankle DF with eversion (therapist resistance) 1 x 10  Resisted ankle PF with eversion (therapist resistance) 1 x 10  Body weight squats with BUE 2 x 10  Self-care: discussed appropriate footwear to allow for improved arch/ankle support.   [facility] Adult PT Treatment:                                                DATE: 06/06/22 Therapeutic Exercise: NuStep level 5 x 5 minutes  Calf stretch on wedge x 60 sec  Leg press 2 x 10; 20 lbs  Wall squats 2 x 10  Standing calf raise 2 x 10  Standing toe raise 2 x 10  Body weight squats 1 x 10  Ankle rockerboard 2 x 10 A/P no UE support  4 way ankle red 1 x 10  Ankle circles CW/CCW 1 x 10 each  Updated HEP  [facility] Adult PT Treatment:  DATE: 06/03/22 Therapeutic Exercise: NuStep level 5 x 5 minutes  Calf stretch on wedge x 60 seconds  Standing calf raise 2 x 10 Standing toe raise 2 x 10  Mini squat with UE support attempted Wall squat partial range 2 x 10  Lateral step ups on airex 2 x 10  Romberg eyes closed on airex 3 x 30 seconds  Tandem on airex 3 x 30 sec each  Standing rockerboard A/P 2 x 1 minute   PATIENT EDUCATION:  Education details: HEP Person educated: Patient Education method: Explanation Education comprehension: Verbalized understanding    HOME EXERCISE PROGRAM: Access Code: [email or web address]    ASSESSMENT: CLINICAL IMPRESSION: Patient tolerated therapy well with no adverse effects.  She demonstrates improved SLS this visit on left and continues to progress well with strengthening exercises. She does continue to demonstrate limitation in SLS compared to the right side and strength deficit of the left ankle. She reported very mild increase in pain with banded strengthening. No changes to made to HEP this visit. Patient would benefit from continued skilled PT to progress her mobility and strength in order to reduce pain and maximize functional ability.    OBJECTIVE IMPAIRMENTS Abnormal gait, decreased activity tolerance, decreased balance, difficulty walking, decreased ROM, decreased strength, increased edema, improper body mechanics, and pain.    ACTIVITY LIMITATIONS carrying, lifting, standing, squatting, stairs, and locomotion level   PARTICIPATION LIMITATIONS: meal prep, cleaning, laundry, shopping, community activity, occupation, and yard work   PERSONAL FACTORS Age, Profession, and Time since onset of injury/illness/exacerbation are also affecting patient's functional outcome.      GOALS: Goals reviewed with patient? No   SHORT TERM GOALS: Target date: 06/03/2022   Patient will be independent and compliant with initial HEP.  Baseline: issued at eval  Goal status: achieved    2.  Patient will be able to perform SL calf raise on the LLE to improve gait mechanics.  Baseline: unable  Goal status: achieved    3.  Patient will demonstrate proper lifting mechanics without an increase in Lt foot pain.  Baseline: see above  Goal status: ongoing; progressing as appropriate    LONG TERM GOALS: Target date: 06/24/2022    Patient will demonstrate at least 4/5 Lt ankle inversion and eversion strength to improve her gait stability.   Baseline: see above  Goal status: INITIAL   2.  Patient will maintain SLS on the LLE for at least 15 seconds to improve her stability with walking on uneven terrain.  Baseline: see above  Goal status: INITIAL   3.  Patient will report pain  at worst rated as 5/10 to improve her tolerance to walking and standing activity.  Baseline: see above  Goal status: INITIAL     PLAN: PT FREQUENCY: 2x/week   PT DURATION: 6 weeks   PLANNED INTERVENTIONS: Therapeutic exercises, Therapeutic activity, Neuromuscular re-education, Balance training, Gait training, Patient/Family education, Self Care, Joint mobilization, Stair training, Dry Needling, Electrical stimulation, Cryotherapy, Moist heat, Vasopneumatic device, Ultrasound, Ionotophoresis 4mg /ml Dexamethasone, Manual therapy, and Re-evaluation   PLAN FOR NEXT SESSION: . review/update HEP PRN, progress ankle strengthening as tolerated, manual to peroneals/plantar fascia, foot intrinsic strengthening   , PT, DPT, LAT, ATC 06/16/22  12:11 PM Phone: [phone] Fax: [phone]

## 2022-06-17 ENCOUNTER — Ambulatory Visit (INDEPENDENT_AMBULATORY_CARE_PROVIDER_SITE_OTHER): Payer: 59 | Admitting: Podiatry

## 2022-06-17 ENCOUNTER — Encounter: Payer: Self-pay | Admitting: Podiatry

## 2022-06-17 DIAGNOSIS — M722 Plantar fascial fibromatosis: Secondary | ICD-10-CM

## 2022-06-18 NOTE — Progress Notes (Signed)
  Subjective:  Patient ID: Crystal Whitaker, female    DOB: 12-07-1965,  MRN: [id number]  Continuing worsening pain still bothersome in the left is much worse she wears the Tri-Lock but it does not help  56 y.o. female returns for follow-up.  Still quite painful.  Most the pain is in the outside of the left ankle.  Sometimes has pain on the inside of the right heel and right ankle medially  Interval history: Doing much better she has about 75 to 80% physical therapy has been very helpful for her  Objective:  Physical Exam: warm, good capillary refill, no trophic changes or ulcerative lesions, normal DP and PT pulses, and normal sensory exam.  Little to no pain on palpation of the posterior tibial tendon or plantar fascia on the left foot today   Radiographs: Multiple views x-ray of the left ankle taken previously: There are mild degenerative changes in the lateral ankle gutter and sinus tarsi and subtalar joint noted, she does have a small heel spur on the left, her previous right foot radiographs 02/03/2021 show small heel spur as well   IMPRESSION: Tendinosis and tenosynovitis of the inframalleolar peroneal tendons, with possible small interstitial tear of the peroneal brevis and small focal split tear of the peroneal longus just prior to the cuboid. Adjacent edema in the soft tissues and within the lateral calcaneus.   Plantar fasciitis involving the proximal central bundle with adjacent reactive edema in the plantar calcaneus with small plantar calcaneal spur.   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   Intact ankle ligaments.     Electronically Signed   By: Caprice Renshaw M.D.   On: 04/14/2022 10:10    IMPRESSION: Mild plantar fasciitis involving the proximal central bundle near the calcaneal insertion.   Mild tendinosis and tenosynovitis of the infra malleolar peroneal tendons.   Mild Achilles peritendinitis.   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     Electronically Signed   By: Caprice Renshaw M.D.   On: 04/14/2022 10:19     Assessment:   1. Plantar fasciitis of left foot        Plan:  Patient was evaluated and treated and all questions answered.  She is doing much better.  I recommend continued physical therapy she has responded well to nonoperative treatment.  She will continue this for next 4 to 6 weeks.  Return to see me as needed if this worsens or does not improve   Return if symptoms worsen or fail to improve.

## 2022-06-19 ENCOUNTER — Ambulatory Visit: Payer: Commercial Managed Care - HMO

## 2022-06-19 DIAGNOSIS — R600 Localized edema: Secondary | ICD-10-CM

## 2022-06-19 DIAGNOSIS — M25572 Pain in left ankle and joints of left foot: Secondary | ICD-10-CM | POA: Diagnosis not present

## 2022-06-19 DIAGNOSIS — R262 Difficulty in walking, not elsewhere classified: Secondary | ICD-10-CM

## 2022-06-19 DIAGNOSIS — M6281 Muscle weakness (generalized): Secondary | ICD-10-CM

## 2022-06-19 NOTE — Therapy (Signed)
OUTPATIENT PHYSICAL THERAPY TREATMENT NOTE   Patient Name: Crystal Whitaker MRN: [id number] DOB:[birth year], 56 y.o., female 28 Date: 06/19/2022  PCP: Marcine Matar, MD   REFERRING PROVIDER: Edwin Cap, DPM    END OF SESSION:   PT End of Session - 06/19/22 1145     Visit Number 10    Number of Visits 13    Date for PT Re-Evaluation 06/28/22    Authorization Type [employer]    PT Start Time 1145    PT Stop Time 1227    PT Time Calculation (min) 42 min    Activity Tolerance Patient tolerated treatment well    Behavior During Therapy WFL for tasks assessed/performed                   Past Medical History:  Diagnosis Date   Back pain    Hypertension    Sinusitis    Past Surgical History:  Procedure Laterality Date   CESAREAN SECTION     Patient Active Problem List   Diagnosis Date Noted   Elevated blood pressure reading in office with diagnosis of hypertension 08/06/2021   Mixed hyperlipidemia 08/06/2021   Headache(784.0) 02/22/2014   Knee pain, acute 04/21/2013   Essential hypertension 12/15/2008   DEGENERATIVE JOINT DISEASE, SHOULDER 12/15/2008   LOW BACK PAIN SYNDROME 08/02/2007    REFERRING DIAG: Plantar fasciitis of left foot, Left peroneal tendinosis   THERAPY DIAG:  Pain in left ankle and joints of left foot  Muscle weakness (generalized)  Difficulty in walking, not elsewhere classified  Localized edema  Rationale for Evaluation and Treatment Rehabilitation  PERTINENT HISTORY: None  PRECAUTIONS: None   SUBJECTIVE: Patient reports she is feeling good today. She had f/u with physician and was cleared to return to work, which she will begin on 9/5.   Declined interpreter   PAIN:  Are you having pain? Yes:  NPRS scale: 4/10 Pain location: Left lateral ankle Pain description: Unable to describe; intermittent Aggravating factors: First thing in the morning, walking, standing Relieving factors: rest, medication  PATIENT GOALS  "feel better"   OBJECTIVE: (objective measures completed at initial evaluation unless otherwise dated) EDEMA:  Mild non-pitting edema about the Lt lateral ankle    PALPATION: TTP    LOWER EXTREMITY ROM:   Active ROM Right eval Left eval Rt / Lt 05/20/2022  Hip flexion       Hip extension       Hip abduction       Hip adduction       Hip internal rotation       Hip external rotation       Knee flexion       Knee extension       Ankle dorsiflexion 5 2 8  / 6 deg  Ankle plantarflexion WNL WNL   Ankle inversion WNL WNL pn!   Ankle eversion WNL WNL pn!    (Blank rows = not tested)   LOWER EXTREMITY MMT:   MMT Right eval Left eval Left 05/29/2022 06/03/22 06/10/22 06/16/2022 06/19/22  Hip flexion           Hip extension           Hip abduction         4-5/ bilateral   Hip adduction           Hip internal rotation           Hip external rotation  Knee flexion           Knee extension           Ankle dorsiflexion 5 5 5       Ankle plantarflexion Able to complete full range calf raise  Unable to complete calf raise  4 Lt: partial range SL calf raise, increased pain Lt: full range SL calf raise     Ankle inversion 5 4- 4   4   Ankle eversion 5 3+ pain 4   4    (Blank rows = not tested)   LOWER EXTREMITY SPECIAL TESTS:  N/A   FUNCTIONAL TESTS:  SLS 7 seconds LLE; 30 seconds RLE   06/16/2022: SLS left 20 seconds Lifting: no hip or knee flexion; completes by flexing only at the lumbar spine; pain    GAIT: Distance walked: 20 ft  Assistive device utilized: None Level of assistance: Complete Independence Comments: excessive pronation; decreased push off on the LLE     TODAY'S TREATMENT [facility] Adult PT Treatment:                                                DATE: 06/19/22 Therapeutic Exercise: NuStep level 6 x 5 minutes  Hip bridge 2 x 10  SLR 2 x 10  Sidelying hip abduction 2 x 10  Sit to stand 2 x 10  Tandem airex balance beam walks 3 sets d/b  3 way hip 2 x  5 each  Updated HEP  [facility] Adult PT Treatment:                                                DATE: 06/16/22 Therapeutic Exercise: NuStep L5 x 5 minutes with UE/LE while taking subjective  Slant board calf stretch 3 x 30 seconds Heel raises 2 x 20 Left SL heel raises 2 x 10 Tandem stance 2 x 30 sec each SLS 3 x 20 sec each Heel walks at counter fwd/bwd x 2 lengths Toe walks at counter fwd/bwd x 2 lengths Tandem walk at counter fwd/bwd x 2 lengths Side stepping at counter with green below knees x 4 lengths down/back 4-way ankle with green 2 x 20 each   [facility] Adult PT Treatment:                                                DATE: 06/10/22 Therapeutic Exercise: NuStep level 5 x 5 minutes  Calf stretch on wedge x 60 sec Heel walks 2 x 10 ft Toe walks attempted unable due to pain Tandem walks 4 x 10 ft  Eccentric calf raise LLE 2 x 10  [place]ble pickups 2 x 15 LLE Ankle circles 2 x 10; CW;CCW SL calf raise LLE 2 x 10  Resisted ankle DF with eversion (therapist resistance) 1 x 10  Resisted ankle PF with eversion (therapist resistance) 1 x 10  Body weight squats with BUE 2 x 10  Self-care: discussed appropriate footwear to allow for improved arch/ankle support.     PATIENT EDUCATION:  Education details: HEP Person educated: Patient Education method: Explanation, demo, cues,  handout Education comprehension: Verbalized understanding, returned demo, cues   HOME EXERCISE PROGRAM: Access Code: [email or web address]    ASSESSMENT: CLINICAL IMPRESSION: Patient had f/u with MD this week and reports that she is cleared to return to work next week. Added in proximal hip strengthening today to assist in improving distal stability with prolonged walking/standing, as she is noted to have weakness in bilateral hips. She has difficulty maintaining proper lumbopelvic positioning with gluteal strengthening. Continues to have difficulty performing squatting activity requiring heavy cues for proper sequencing of  hip and knee flexion during descent of sit to stand. Progressed dynamic balance activity on unstable surface with occasional LOB noted requiring reaching strategy to regain her balance. Good tolerance to today's session with patient reporting a decrease in foot pain at conclusion.     OBJECTIVE IMPAIRMENTS Abnormal gait, decreased activity tolerance, decreased balance, difficulty walking, decreased ROM, decreased strength, increased edema, improper body mechanics, and pain.    ACTIVITY LIMITATIONS carrying, lifting, standing, squatting, stairs, and locomotion level   PARTICIPATION LIMITATIONS: meal prep, cleaning, laundry, shopping, community activity, occupation, and yard work   PERSONAL FACTORS Age, Profession, and Time since onset of injury/illness/exacerbation are also affecting patient's functional outcome.      GOALS: Goals reviewed with patient? No   SHORT TERM GOALS: Target date: 06/03/2022   Patient will be independent and compliant with initial HEP.  Baseline: issued at eval  Goal status: achieved    2.  Patient will be able to perform SL calf raise on the LLE to improve gait mechanics.  Baseline: unable  Goal status: achieved    3.  Patient will demonstrate proper lifting mechanics without an increase in Lt foot pain.  Baseline: see above  Goal status: ongoing; progressing as appropriate    LONG TERM GOALS: Target date: 06/24/2022    Patient will demonstrate at least 4/5 Lt ankle inversion and eversion strength to improve her gait stability.   Baseline: see above  Goal status: INITIAL   2.  Patient will maintain SLS on the LLE for at least 15 seconds to improve her stability with walking on uneven terrain.  Baseline: see above  Goal status: INITIAL   3.  Patient will report pain at worst rated as 5/10 to improve her tolerance to walking and standing activity.  Baseline: see above  Goal status: INITIAL     PLAN: PT FREQUENCY: 2x/week   PT DURATION: 6 weeks    PLANNED INTERVENTIONS: Therapeutic exercises, Therapeutic activity, Neuromuscular re-education, Balance training, Gait training, Patient/Family education, Self Care, Joint mobilization, Stair training, Dry Needling, Electrical stimulation, Cryotherapy, Moist heat, Vasopneumatic device, Ultrasound, Ionotophoresis 4mg /ml Dexamethasone, Manual therapy, and Re-evaluation   PLAN FOR NEXT SESSION: . review/update HEP PRN, progress ankle strengthening as tolerated, manual to peroneals/plantar fascia, foot intrinsic strengthening  , PT, DPT, ATC 06/19/22 12:27 PM

## 2022-06-24 ENCOUNTER — Ambulatory Visit: Payer: Commercial Managed Care - HMO | Admitting: Physical Therapy

## 2022-06-25 ENCOUNTER — Other Ambulatory Visit: Payer: Self-pay | Admitting: Internal Medicine

## 2022-06-25 DIAGNOSIS — I10 Essential (primary) hypertension: Secondary | ICD-10-CM

## 2022-06-25 DIAGNOSIS — Z79899 Other long term (current) drug therapy: Secondary | ICD-10-CM

## 2022-06-25 DIAGNOSIS — E782 Mixed hyperlipidemia: Secondary | ICD-10-CM

## 2022-06-25 NOTE — Telephone Encounter (Signed)
Medication Refill - Medication: lisinopril (ZESTRIL) 20 MG tablet potassium chloride (KLOR-CON) 10 MEQ tablet hydrochlorothiazide (HYDRODIURIL) 25 MG tablet rosuvastatin (CRESTOR) 10 MG tablet  Has the patient contacted their pharmacy? No. Pt needed an appt.  First available w/ her dr is Nov. 17.  Can you send in a 90 day supply to get her to her appt?   Preferred Pharmacy (with phone number or street name): [id number] - [place], [place] - [address] AT [facility] [address] Has the patient been seen for an appointment in the last year OR does the patient have an upcoming appointment? Yes.    Agent: Please be advised that RX refills may take up to 3 business days. We ask that you follow-up with your pharmacy.

## 2022-06-26 MED ORDER — HYDROCHLOROTHIAZIDE 25 MG PO TABS: 25.0000 mg | ORAL_TABLET | Freq: Every day | ORAL | 0 refills | Status: DC

## 2022-06-26 MED ORDER — POTASSIUM CHLORIDE ER 10 MEQ PO TBCR: EXTENDED_RELEASE_TABLET | ORAL | 0 refills | Status: DC

## 2022-06-26 MED ORDER — ROSUVASTATIN CALCIUM 10 MG PO TABS: 10.0000 mg | ORAL_TABLET | Freq: Every day | ORAL | 0 refills | Status: DC

## 2022-06-26 MED ORDER — LISINOPRIL 20 MG PO TABS: 20.0000 mg | ORAL_TABLET | Freq: Every day | ORAL | 0 refills | Status: DC

## 2022-06-26 NOTE — Telephone Encounter (Signed)
Requested medication (s) are due for refill today: yes  Requested medication (s) are on the active medication list: yes  Last refill:  Lisinopril, Hydrodiuril, and Klor all 08/06/21 #90 with 1 RF, Crestor 02/27/21 #30 with 3 RF  Future visit scheduled: 09/05/22, seen 08/06/2021  Notes to clinic:  Failed protocol of labs within 12 months, has upcoming appt, please assess.       Requested Prescriptions  Pending Prescriptions Disp Refills   lisinopril (ZESTRIL) 20 MG tablet 90 tablet 1    Sig: Take 1 tablet (20 mg total) by mouth daily. To lower blood pressure     Cardiovascular:  ACE Inhibitors Failed - 06/25/2022  9:56 AM      Failed - Cr in normal range and within 180 days    Creat  Date Value Ref Range Status  10/23/2016 0.45 (L) 0.50 - 1.05 mg/dL Final    Comment:      For patients > or = 58 years of age: The upper reference limit for Creatinine is approximately 13% higher for people identified as African-American.      Creatinine, Ser  Date Value Ref Range Status  02/25/2021 0.65 0.57 - 1.00 mg/dL Final   Creatinine, Urine  Date Value Ref Range Status  10/23/2016 126 20 - 320 mg/dL Final         Failed - K in normal range and within 180 days    Potassium  Date Value Ref Range Status  02/25/2021 3.9 3.5 - 5.2 mmol/L Final         Failed - Valid encounter within last 6 months    Recent Outpatient Visits           7 months ago Essential hypertension   [facility] Jonah Blue B, MD   10 months ago Elevated blood pressure reading in office with diagnosis of hypertension   [facility] [place], Cari S, [place]   1 year ago Essential hypertension   [place] [facility] Marcine Matar, MD   1 year ago Essential hypertension   [place] [facility] [place], [place], MD   2 years ago Essential hypertension   [facility] [place], [place], MD        Future Appointments             In 2 months Marcine Matar, MD [facility]            Passed - Patient is not pregnant      Passed - Last BP in normal range    BP Readings from Last 1 Encounters:  11/07/21 128/68          hydrochlorothiazide (HYDRODIURIL) 25 MG tablet 90 tablet 1    Sig: Take 1 tablet (25 mg total) by mouth daily.     Cardiovascular: Diuretics - Thiazide Failed - 06/25/2022  9:56 AM      Failed - Cr in normal range and within 180 days    Creat  Date Value Ref Range Status  10/23/2016 0.45 (L) 0.50 - 1.05 mg/dL Final    Comment:      For patients > or = 58 years of age: The upper reference limit for Creatinine is approximately 13% higher for people identified as African-American.      Creatinine, Ser  Date Value Ref Range Status  02/25/2021 0.65 0.57 - 1.00 mg/dL Final  Creatinine, Urine  Date Value Ref Range Status  10/23/2016 126 20 - 320 mg/dL Final         Failed - K in normal range and within 180 days    Potassium  Date Value Ref Range Status  02/25/2021 3.9 3.5 - 5.2 mmol/L Final         Failed - Na in normal range and within 180 days    Sodium  Date Value Ref Range Status  02/25/2021 141 134 - 144 mmol/L Final         Failed - Valid encounter within last 6 months    Recent Outpatient Visits           7 months ago Essential hypertension   [facility] Jonah Blue B, MD   10 months ago Elevated blood pressure reading in office with diagnosis of hypertension   [facility] [facility] May[place], Cari S, [place]   1 year ago Essential hypertension   [facility] [facility] Marcine Matar, [place]   1 year ago Essential hypertension   [place] [facility] [place], [place], MD   2 years ago Essential hypertension   [place] [facility] [place], [place], MD       Future Appointments              In 2 months Marcine Matar, MD [facility]            Passed - Last BP in normal range    BP Readings from Last 1 Encounters:  11/07/21 128/68          rosuvastatin (CRESTOR) 10 MG tablet 30 tablet 3    Sig: Take 1 tablet (10 mg total) by mouth daily. To lower cholesterol     Cardiovascular:  Antilipid - Statins 2 Failed - 06/25/2022  9:56 AM      Failed - Cr in normal range and within 360 days    Creat  Date Value Ref Range Status  10/23/2016 0.45 (L) 0.50 - 1.05 mg/dL Final    Comment:      For patients > or = 58 years of age: The upper reference limit for Creatinine is approximately 13% higher for people identified as African-American.      Creatinine, Ser  Date Value Ref Range Status  02/25/2021 0.65 0.57 - 1.00 mg/dL Final   Creatinine, Urine  Date Value Ref Range Status  10/23/2016 126 20 - 320 mg/dL Final         Failed - Lipid Panel in normal range within the last 12 months    Cholesterol, Total  Date Value Ref Range Status  02/25/2021 245 (H) 100 - 199 mg/dL Final   LDL Chol Calc (NIH)  Date Value Ref Range Status  02/25/2021 163 (H) 0 - 99 mg/dL Final   HDL  Date Value Ref Range Status  02/25/2021 68 >39 mg/dL Final   Triglycerides  Date Value Ref Range Status  02/25/2021 83 0 - 149 mg/dL Final         Passed - Patient is not pregnant      Passed - Valid encounter within last 12 months    Recent Outpatient Visits           7 months ago Essential hypertension   [facility] [place], Gavin Pound B, MD   10 months ago Elevated blood pressure reading  in office with diagnosis of hypertension   [place] [address] [facility] [place], Cari S, [place]   1 year ago Essential hypertension   [place] [facility] [facility] [place], Binnie Rail, MD   1 year ago Essential hypertension   [place] [facility] [place], [place], MD   2 years ago Essential  hypertension   [facility] [facility] [place], [place], MD       Future Appointments             In 2 months Marcine Matar, MD [place] [facility]             potassium chloride (KLOR-CON) 10 MEQ tablet 180 tablet 1    Sig: TAKE 2 TABLETS(20 MEQ) BY MOUTH DAILY     Endocrinology:  Minerals - Potassium Supplementation Failed - 06/25/2022  9:56 AM      Failed - K in normal range and within 360 days    Potassium  Date Value Ref Range Status  02/25/2021 3.9 3.5 - 5.2 mmol/L Final         Failed - Cr in normal range and within 360 days    Creat  Date Value Ref Range Status  10/23/2016 0.45 (L) 0.50 - 1.05 mg/dL Final    Comment:      For patients > or = 58 years of age: The upper reference limit for Creatinine is approximately 13% higher for people identified as African-American.      Creatinine, Ser  Date Value Ref Range Status  02/25/2021 0.65 0.57 - 1.00 mg/dL Final   Creatinine, Urine  Date Value Ref Range Status  10/23/2016 126 20 - 320 mg/dL Final         Passed - Valid encounter within last 12 months    Recent Outpatient Visits           7 months ago Essential hypertension   [place] Jonah Blue B, MD   10 months ago Elevated blood pressure reading in office with diagnosis of hypertension   [place] [facility] May[place], Cari S, [place]   1 year ago Essential hypertension   [place] [facility] Marcine Matar, [place]   1 year ago Essential hypertension   [place] [facility] [place], [place], MD   2 years ago Essential hypertension   [place] [facility] [place], [place], MD       Future Appointments             In 2 months Laural Benes, Binnie Rail, MD [facility]

## 2022-06-27 ENCOUNTER — Ambulatory Visit: Payer: Commercial Managed Care - HMO

## 2022-06-30 ENCOUNTER — Ambulatory Visit: Payer: Commercial Managed Care - HMO | Attending: Podiatry

## 2022-06-30 DIAGNOSIS — M6281 Muscle weakness (generalized): Secondary | ICD-10-CM | POA: Diagnosis present

## 2022-06-30 DIAGNOSIS — R262 Difficulty in walking, not elsewhere classified: Secondary | ICD-10-CM | POA: Diagnosis present

## 2022-06-30 DIAGNOSIS — M25572 Pain in left ankle and joints of left foot: Secondary | ICD-10-CM | POA: Diagnosis present

## 2022-06-30 DIAGNOSIS — R600 Localized edema: Secondary | ICD-10-CM | POA: Diagnosis present

## 2022-06-30 NOTE — Therapy (Signed)
OUTPATIENT PHYSICAL THERAPY TREATMENT NOTE/RE-CERTIFICATION   Patient Name: Crystal Whitaker MRN: [id number] DOB:January 27, 1966, 56 y.o., female Today's Date: 07/01/2022  PCP: Ladell Pier, MD   REFERRING PROVIDER: Criselda Peaches, DPM    END OF SESSION:   PT End of Session - 06/30/22 1613     Visit Number 11    Number of Visits 19    Date for PT Re-Evaluation 08/02/22    Authorization Type [employer]    PT Start Time 1611    PT Stop Time 1651    PT Time Calculation (min) 40 min    Activity Tolerance Patient tolerated treatment well    Behavior During Therapy WFL for tasks assessed/performed                    Past Medical History:  Diagnosis Date   Back pain    Hypertension    Sinusitis    Past Surgical History:  Procedure Laterality Date   CESAREAN SECTION     Patient Active Problem List   Diagnosis Date Noted   Elevated blood pressure reading in office with diagnosis of hypertension 08/06/2021   Mixed hyperlipidemia 08/06/2021   Headache(784.0) 02/22/2014   Knee pain, acute 04/21/2013   Essential hypertension 12/15/2008   DEGENERATIVE JOINT DISEASE, SHOULDER 12/15/2008   LOW BACK PAIN SYNDROME 08/02/2007    REFERRING DIAG: Plantar fasciitis of left foot, Left peroneal tendinosis   THERAPY DIAG:  Pain in left ankle and joints of left foot  Muscle weakness (generalized)  Difficulty in walking, not elsewhere classified  Localized edema  Rationale for Evaluation and Treatment Rehabilitation  PERTINENT HISTORY: None  PRECAUTIONS: None   SUBJECTIVE: Patient has not returned to full work duties yet and is undergoing orientation at work. Patient reports her foot is feeling good especially in the morning, but her pain can increase as the day goes on.   Declined interpreter   PAIN:  Are you having pain? Yes:  NPRS scale: 6/10 Pain location: Left lateral ankle Pain description: Unable to describe; intermittent Aggravating factors: walking,  standing  Relieving factors: rest, medication  PATIENT GOALS "feel better"   OBJECTIVE: (objective measures completed at initial evaluation unless otherwise dated) EDEMA:  Mild non-pitting edema about the Lt lateral ankle    PALPATION: TTP    LOWER EXTREMITY ROM:   Active ROM Right eval Left eval Rt / Lt 05/20/2022 06/30/22 Left  Hip flexion        Hip extension        Hip abduction        Hip adduction        Hip internal rotation        Hip external rotation        Knee flexion        Knee extension        Ankle dorsiflexion '5 2 8 ' / 6 deg 11  Ankle plantarflexion WNL WNL  WNL  Ankle inversion WNL WNL pn!  WNL pn!  Ankle eversion WNL WNL pn!  WNL pn!   (Blank rows = not tested)   LOWER EXTREMITY MMT:   MMT Right eval Left eval Left 05/29/2022 06/03/22 06/10/22 06/16/2022 06/19/22 06/30/22 Left   Hip flexion            Hip extension            Hip abduction         4-5/ bilateral    Hip adduction  Hip internal rotation            Hip external rotation            Knee flexion            Knee extension            Ankle dorsiflexion '5 5 5     5  ' Ankle plantarflexion Able to complete full range calf raise  Unable to complete calf raise  4 Lt: partial range SL calf raise, increased pain Lt: full range SL calf raise    Full range SL calf raise pn!  Ankle inversion 5 4- '4   4  5  ' Ankle eversion 5 3+ pain 4   4  4+ pn!   (Blank rows = not tested)   LOWER EXTREMITY SPECIAL TESTS:  N/A   FUNCTIONAL TESTS:  SLS 7 seconds LLE; 30 seconds RLE   06/16/2022: SLS left 20 seconds Lifting: no hip or knee flexion; completes by flexing only at the lumbar spine; pain   06/30/22: 30 seconds LLE  Lifting: either only flexes at her knees or her trunk; limited hip flexion, increased foot pain    GAIT: Distance walked: 20 ft  Assistive device utilized: None Level of assistance: Complete Independence Comments: excessive pronation; decreased push off on the LLE; 06/30/22:  excessive pronation, decreased push-off    TODAY'S TREATMENT [facility] Adult PT Treatment:                                                DATE: 06/30/22 Therapeutic Exercise: NuStep level 6 x 5 minutes  Slant board calf stretch x 60 seconds Leg press 2 x 10 @ 40 lbs  Eccentric calf raise 2 x 10  Toe walking 2 x 20 ft  Heel walking 2 x 20 ft   Therapuetic Activity: Re-assessment to determine overall progress educating patient on progress towards goals and lingering impairments that will be addressed throughout POC.    [facility] Adult PT Treatment:                                                DATE: 06/19/22 Therapeutic Exercise: NuStep level 6 x 5 minutes  Hip bridge 2 x 10  SLR 2 x 10  Sidelying hip abduction 2 x 10  Sit to stand 2 x 10  Tandem airex balance beam walks 3 sets d/b  3 way hip 2 x 5 each  Updated HEP  [facility] Adult PT Treatment:                                                DATE: 06/16/22 Therapeutic Exercise: NuStep L5 x 5 minutes with UE/LE while taking subjective  Slant board calf stretch 3 x 30 seconds Heel raises 2 x 20 Left SL heel raises 2 x 10 Tandem stance 2 x 30 sec each SLS 3 x 20 sec each Heel walks at counter fwd/bwd x 2 lengths Toe walks at counter fwd/bwd x 2 lengths Tandem walk at counter fwd/bwd x 2 lengths Side stepping at counter with green  below knees x 4 lengths down/back 4-way ankle with green 2 x 20 each  PATIENT EDUCATION Education details: see treatment  Person educated: Patient Education method: Explanation Education comprehension: Verbalized understanding  HOME EXERCISE PROGRAM: Access Code: [email or web address]    ASSESSMENT: CLINICAL IMPRESSION: Patient has progressed well since the start of care demonstrating improvements in ankle strength, ROM, and balance. She has met 2/3 short term goals and 2/3 long term goals. She continues to have pain and weakness with plantarflexor and evertor MMT, aberrant squatting mechanics, and painful ankle AROM.  She will benefit from continued skilled PT 1-2/week for 4 weeks to further progress her ankle ROM, strength, balance, and body mechanics with functional tasks in order to reduce pain and maximize functional ability.      OBJECTIVE IMPAIRMENTS Abnormal gait, decreased activity tolerance, decreased balance, difficulty walking, decreased ROM, decreased strength, increased edema, improper body mechanics, and pain.    ACTIVITY LIMITATIONS carrying, lifting, standing, squatting, stairs, and locomotion level   PARTICIPATION LIMITATIONS: meal prep, cleaning, laundry, shopping, community activity, occupation, and yard work   PERSONAL FACTORS Age, Profession, and Time since onset of injury/illness/exacerbation are also affecting patient's functional outcome.      GOALS: Goals reviewed with patient? No   SHORT TERM GOALS: Target date: 06/03/2022   Patient will be independent and compliant with initial HEP.  Baseline: issued at eval  Goal status: achieved    2.  Patient will be able to perform SL calf raise on the LLE to improve gait mechanics.  Baseline: unable  Goal status: achieved    3.  Patient will demonstrate proper lifting mechanics without an increase in Lt foot pain.  Baseline: see above  Goal status: ongoing LONG TERM GOALS: Target date: 06/24/2022    Patient will demonstrate at least 4/5 Lt ankle inversion and eversion strength to improve her gait stability.   Baseline: see above  Goal status: achieved    2.  Patient will maintain SLS on the LLE for at least 15 seconds to improve her stability with walking on uneven terrain.  Baseline: see above  Goal status: achieved    3.  Patient will report pain at worst rated as 5/10 to improve her tolerance to walking and standing activity.  Baseline: see above  Goal status: ongoing       PLAN: PT FREQUENCY: 1-2/week   PT DURATION: 4 weeks   PLANNED INTERVENTIONS: Therapeutic exercises, Therapeutic activity, Neuromuscular  re-education, Balance training, Gait training, Patient/Family education, Self Care, Joint mobilization, Stair training, Dry Needling, Electrical stimulation, Cryotherapy, Moist heat, Vasopneumatic device, Ultrasound, Ionotophoresis 45m/ml Dexamethasone, Manual therapy, and Re-evaluation   PLAN FOR NEXT SESSION: . review/update HEP PRN, progress ankle strengthening as tolerated, manual to peroneals/plantar fascia, foot intrinsic strengthening, CKC strengthening  SGwendolyn Grant PT, DPT, ATC 07/01/22 8:31 AM

## 2022-07-03 ENCOUNTER — Ambulatory Visit: Payer: Commercial Managed Care - HMO

## 2022-07-03 DIAGNOSIS — M6281 Muscle weakness (generalized): Secondary | ICD-10-CM

## 2022-07-03 DIAGNOSIS — M25572 Pain in left ankle and joints of left foot: Secondary | ICD-10-CM

## 2022-07-03 DIAGNOSIS — R600 Localized edema: Secondary | ICD-10-CM

## 2022-07-03 DIAGNOSIS — R262 Difficulty in walking, not elsewhere classified: Secondary | ICD-10-CM

## 2022-07-03 NOTE — Therapy (Signed)
OUTPATIENT PHYSICAL THERAPY TREATMENT NOTE   Patient Name: Crystal Whitaker MRN: [id number] DOB:10/29/1965, 56 y.o., female Today's Date: 07/03/2022  PCP: Marcine Matar, MD   REFERRING PROVIDER: Edwin Cap, DPM    END OF SESSION:   PT End of Session - 07/03/22 1701     Visit Number 12    Number of Visits 19    Date for PT Re-Evaluation 08/02/22    Authorization Type [employer]    PT Start Time 1700    PT Stop Time 1739    PT Time Calculation (min) 39 min    Activity Tolerance Patient tolerated treatment well    Behavior During Therapy WFL for tasks assessed/performed                     Past Medical History:  Diagnosis Date   Back pain    Hypertension    Sinusitis    Past Surgical History:  Procedure Laterality Date   CESAREAN SECTION     Patient Active Problem List   Diagnosis Date Noted   Elevated blood pressure reading in office with diagnosis of hypertension 08/06/2021   Mixed hyperlipidemia 08/06/2021   Headache(784.0) 02/22/2014   Knee pain, acute 04/21/2013   Essential hypertension 12/15/2008   DEGENERATIVE JOINT DISEASE, SHOULDER 12/15/2008   LOW BACK PAIN SYNDROME 08/02/2007    REFERRING DIAG: Plantar fasciitis of left foot, Left peroneal tendinosis   THERAPY DIAG:  Pain in left ankle and joints of left foot  Muscle weakness (generalized)  Difficulty in walking, not elsewhere classified  Localized edema  Rationale for Evaluation and Treatment Rehabilitation  PERTINENT HISTORY: None  PRECAUTIONS: None   SUBJECTIVE: Patient reports she is feeling good right now without pain. She starts work Advertising account executive. She reports compliance with HEP.   Declined interpreter   PAIN:  Are you having pain? None currently At worst: NPRS scale: 5/10 Pain location: Left lateral ankle Pain description: Unable to describe; intermittent Aggravating factors: walking, standing  Relieving factors: rest, medication  PATIENT GOALS "feel  better"   OBJECTIVE: (objective measures completed at initial evaluation unless otherwise dated) EDEMA:  Mild non-pitting edema about the Lt lateral ankle    PALPATION: TTP    LOWER EXTREMITY ROM:   Active ROM Right eval Left eval Rt / Lt 05/20/2022 06/30/22 Left  Hip flexion        Hip extension        Hip abduction        Hip adduction        Hip internal rotation        Hip external rotation        Knee flexion        Knee extension        Ankle dorsiflexion 5 2 8  / 6 deg 11  Ankle plantarflexion WNL WNL  WNL  Ankle inversion WNL WNL pn!  WNL pn!  Ankle eversion WNL WNL pn!  WNL pn!   (Blank rows = not tested)   LOWER EXTREMITY MMT:   MMT Right eval Left eval Left 05/29/2022 06/03/22 06/10/22 06/16/2022 06/19/22 06/30/22 Left   Hip flexion            Hip extension            Hip abduction         4-5/ bilateral    Hip adduction            Hip internal rotation  Hip external rotation            Knee flexion            Knee extension            Ankle dorsiflexion 5 5 5     5   Ankle plantarflexion Able to complete full range calf raise  Unable to complete calf raise  4 Lt: partial range SL calf raise, increased pain Lt: full range SL calf raise    Full range SL calf raise pn!  Ankle inversion 5 4- 4   4  5   Ankle eversion 5 3+ pain 4   4  4+ pn!   (Blank rows = not tested)   LOWER EXTREMITY SPECIAL TESTS:  N/A   FUNCTIONAL TESTS:  SLS 7 seconds LLE; 30 seconds RLE   06/16/2022: SLS left 20 seconds Lifting: no hip or knee flexion; completes by flexing only at the lumbar spine; pain   06/30/22: 30 seconds LLE  Lifting: either only flexes at her knees or her trunk; limited hip flexion, increased foot pain    GAIT: Distance walked: 20 ft  Assistive device utilized: None Level of assistance: Complete Independence Comments: excessive pronation; decreased push off on the LLE; 06/30/22: excessive pronation, decreased push-off    TODAY'S TREATMENT [facility]  Adult PT Treatment:                                                DATE: 07/03/22 Therapeutic Exercise: NuStep level 6 x 5 minutes  Calf stretch on wedge x 1 minute  Calf raise with ball between ankles 2 x 10 SL calf raise on leg press 2 x 10 @ 20 lbs   Leg press 2 x 10 @ 60 lbs  Squat to chair 2 x 10  Towel slides inversion/eversion x 2 each 2# Ankle circles 2 x 10; CW/CCW 2#  [facility] Adult PT Treatment:                                                DATE: 06/30/22 Therapeutic Exercise: NuStep level 6 x 5 minutes  Slant board calf stretch x 60 seconds Leg press 2 x 10 @ 40 lbs  Eccentric calf raise 2 x 10  Toe walking 2 x 20 ft  Heel walking 2 x 20 ft   Therapuetic Activity: Re-assessment to determine overall progress educating patient on progress towards goals and lingering impairments that will be addressed throughout POC.    [facility] Adult PT Treatment:                                                DATE: 06/19/22 Therapeutic Exercise: NuStep level 6 x 5 minutes  Hip bridge 2 x 10  SLR 2 x 10  Sidelying hip abduction 2 x 10  Sit to stand 2 x 10  Tandem airex balance beam walks 3 sets d/b  3 way hip 2 x 5 each  Updated HEP   PATIENT EDUCATION Education details: n/a Person educated: n/a Education method: n/a Education comprehension: n/a  HOME EXERCISE PROGRAM:  Access Code: [email or web address]    ASSESSMENT: CLINICAL IMPRESSION: Patient tolerated session well today focusing on progression of calf strengthening and squatting activity. She reported occasional lateral ankle pain with calf raises in standing and on the leg press that subsided once she rested. She is able to complete a few reps of squat to chair with proper form, though most reps requiring cueing to allow for hip flexion and correction of hip shift and excessive anterior tibial translation. No reports of pain at conclusion of session.      OBJECTIVE IMPAIRMENTS Abnormal gait, decreased activity tolerance, decreased  balance, difficulty walking, decreased ROM, decreased strength, increased edema, improper body mechanics, and pain.    ACTIVITY LIMITATIONS carrying, lifting, standing, squatting, stairs, and locomotion level   PARTICIPATION LIMITATIONS: meal prep, cleaning, laundry, shopping, community activity, occupation, and yard work   PERSONAL FACTORS Age, Profession, and Time since onset of injury/illness/exacerbation are also affecting patient's functional outcome.      GOALS: Goals reviewed with patient? No   SHORT TERM GOALS: Target date: 06/03/2022   Patient will be independent and compliant with initial HEP.  Baseline: issued at eval  Goal status: achieved    2.  Patient will be able to perform SL calf raise on the LLE to improve gait mechanics.  Baseline: unable  Goal status: achieved    3.  Patient will demonstrate proper lifting mechanics without an increase in Lt foot pain.  Baseline: see above  Goal status: ongoing LONG TERM GOALS: Target date: 06/24/2022    Patient will demonstrate at least 4/5 Lt ankle inversion and eversion strength to improve her gait stability.   Baseline: see above  Goal status: achieved    2.  Patient will maintain SLS on the LLE for at least 15 seconds to improve her stability with walking on uneven terrain.  Baseline: see above  Goal status: achieved    3.  Patient will report pain at worst rated as 5/10 to improve her tolerance to walking and standing activity.  Baseline: see above  Goal status: ongoing       PLAN: PT FREQUENCY: 1-2/week   PT DURATION: 4 weeks   PLANNED INTERVENTIONS: Therapeutic exercises, Therapeutic activity, Neuromuscular re-education, Balance training, Gait training, Patient/Family education, Self Care, Joint mobilization, Stair training, Dry Needling, Electrical stimulation, Cryotherapy, Moist heat, Vasopneumatic device, Ultrasound, Ionotophoresis 4mg /ml Dexamethasone, Manual therapy, and Re-evaluation   PLAN FOR NEXT  SESSION: . review/update HEP PRN, progress ankle strengthening as tolerated, manual to peroneals/plantar fascia, foot intrinsic strengthening, CKC strengthening  , PT, DPT, ATC 07/03/22 5:40 PM

## 2022-07-16 ENCOUNTER — Other Ambulatory Visit: Payer: Self-pay

## 2022-07-16 ENCOUNTER — Ambulatory Visit: Payer: Commercial Managed Care - HMO | Admitting: Physical Therapy

## 2022-07-16 ENCOUNTER — Encounter: Payer: Self-pay | Admitting: Physical Therapy

## 2022-07-16 DIAGNOSIS — R600 Localized edema: Secondary | ICD-10-CM

## 2022-07-16 DIAGNOSIS — M6281 Muscle weakness (generalized): Secondary | ICD-10-CM

## 2022-07-16 DIAGNOSIS — R262 Difficulty in walking, not elsewhere classified: Secondary | ICD-10-CM

## 2022-07-16 DIAGNOSIS — M25572 Pain in left ankle and joints of left foot: Secondary | ICD-10-CM | POA: Diagnosis not present

## 2022-07-16 NOTE — Therapy (Signed)
OUTPATIENT PHYSICAL THERAPY TREATMENT NOTE   Patient Name: Crystal Whitaker MRN: [id number] DOB:[birth year], 56 y.o., female 60 Date: 07/16/2022  PCP: Ladell Pier, MD   REFERRING PROVIDER: Criselda Peaches, DPM    END OF SESSION:   PT End of Session - 07/16/22 1655     Visit Number 13    Number of Visits 19    Date for PT Re-Evaluation 08/02/22    Authorization Type [employer]    PT Start Time 1652    PT Stop Time 1730    PT Time Calculation (min) 38 min    Activity Tolerance Patient tolerated treatment well    Behavior During Therapy WFL for tasks assessed/performed                      Past Medical History:  Diagnosis Date   Back pain    Hypertension    Sinusitis    Past Surgical History:  Procedure Laterality Date   CESAREAN SECTION     Patient Active Problem List   Diagnosis Date Noted   Elevated blood pressure reading in office with diagnosis of hypertension 08/06/2021   Mixed hyperlipidemia 08/06/2021   Headache(784.0) 02/22/2014   Knee pain, acute 04/21/2013   Essential hypertension 12/15/2008   DEGENERATIVE JOINT DISEASE, SHOULDER 12/15/2008   LOW BACK PAIN SYNDROME 08/02/2007    REFERRING DIAG: Plantar fasciitis of left foot, Left peroneal tendinosis   THERAPY DIAG:  Pain in left ankle and joints of left foot  Muscle weakness (generalized)  Difficulty in walking, not elsewhere classified  Localized edema  Rationale for Evaluation and Treatment Rehabilitation  PERTINENT HISTORY: None  PRECAUTIONS: None   SUBJECTIVE: Patient reports she is good, work has been good and she is wearing new shoes.   Declined interpreter   PAIN:  Are you having pain? None currently At worst: NPRS scale: 4/10 Pain location: Left lateral ankle Pain description: Unable to describe; intermittent Aggravating factors: walking, standing  Relieving factors: rest, medication  PATIENT GOALS "feel better"   OBJECTIVE: (objective measures  completed at initial evaluation unless otherwise dated) EDEMA:  Mild non-pitting edema about the Lt lateral ankle    PALPATION: TTP    LOWER EXTREMITY ROM:   Active ROM Right eval Left eval Rt / Lt 05/20/2022 06/30/22 Left  Hip flexion        Hip extension        Hip abduction        Hip adduction        Hip internal rotation        Hip external rotation        Knee flexion        Knee extension        Ankle dorsiflexion 5 2 8  / 6 deg 11  Ankle plantarflexion WNL WNL  WNL  Ankle inversion WNL WNL pn!  WNL pn!  Ankle eversion WNL WNL pn!  WNL pn!   (Blank rows = not tested)   LOWER EXTREMITY MMT:   MMT Right eval Left eval Left 05/29/2022 06/03/22 06/10/22 06/16/2022 06/19/22 06/30/22 Left   Hip flexion            Hip extension            Hip abduction         4-5/ bilateral    Hip adduction            Hip internal rotation  Hip external rotation            Knee flexion            Knee extension            Ankle dorsiflexion 5 5 5     5   Ankle plantarflexion Able to complete full range calf raise  Unable to complete calf raise  4 Lt: partial range SL calf raise, increased pain Lt: full range SL calf raise    Full range SL calf raise pn!  Ankle inversion 5 4- 4   4  5   Ankle eversion 5 3+ pain 4   4  4+ pn!   (Blank rows = not tested)   LOWER EXTREMITY SPECIAL TESTS:  N/A   FUNCTIONAL TESTS:  SLS 7 seconds LLE; 30 seconds RLE   06/16/2022: SLS left 20 seconds Lifting: no hip or knee flexion; completes by flexing only at the lumbar spine; pain   06/30/22: 30 seconds LLE  Lifting: either only flexes at her knees or her trunk; limited hip flexion, increased foot pain    GAIT: Distance walked: 20 ft  Assistive device utilized: None Level of assistance: Complete Independence Comments: excessive pronation; decreased push off on the LLE; 06/30/22: excessive pronation, decreased push-off    TODAY'S TREATMENT [facility] Adult PT Treatment:                                                 DATE: 07/16/22 Therapeutic Exercise: NuStep L6 x 5 min with UE/LE while taking subjective Slant board calf stretch 3 x 30 sec Heel raises 2 x 20 Tandem stance 2 x 30 sec each Left SL heel raises 2 x 10 SLS 2 x 30 sec each 4-way ankle with green 2 x 20 Squat to chair x 15, holding 10# at chest 2 x 10    [facility] Adult PT Treatment:                                                DATE: 07/03/22 Therapeutic Exercise: NuStep level 6 x 5 minutes  Calf stretch on wedge x 1 minute  Calf raise with ball between ankles 2 x 10 SL calf raise on leg press 2 x 10 @ 20 lbs   Leg press 2 x 10 @ 60 lbs  Squat to chair 2 x 10  Towel slides inversion/eversion x 2 each 2# Ankle circles 2 x 10; CW/CCW 2#  [facility] Adult PT Treatment:                                                DATE: 06/30/22 Therapeutic Exercise: NuStep level 6 x 5 minutes  Slant board calf stretch x 60 seconds Leg press 2 x 10 @ 40 lbs  Eccentric calf raise 2 x 10  Toe walking 2 x 20 ft  Heel walking 2 x 20 ft  Therapuetic Activity: Re-assessment to determine overall progress educating patient on progress towards goals and lingering impairments that will be addressed throughout POC.   [facility] Adult PT Treatment:  DATE: 06/19/22 Therapeutic Exercise: NuStep level 6 x 5 minutes  Hip bridge 2 x 10  SLR 2 x 10  Sidelying hip abduction 2 x 10  Sit to stand 2 x 10  Tandem airex balance beam walks 3 sets d/b  3 way hip 2 x 5 each  Updated HEP   PATIENT EDUCATION:  Education details: HEP Person educated: Patient Education method: Explanation Education comprehension: Verbalized understanding    HOME EXERCISE PROGRAM: Access Code: [email or web address]    ASSESSMENT: CLINICAL IMPRESSION: Patient tolerated therapy well with no adverse effects. Therapy focused on continued stretching, strengthening, and stability exercises for the left ankle. She seems to be progressing with  strengthening well. She did report a little increase in pain with SL heel raises but able to complete. She does require cueing throughout therapy for proper exercise technique and squat technique. No changes to HEP this visit. Patient would benefit from continued skilled PT to progress her mobility and strength in order to reduce pain and maximize functional ability.    OBJECTIVE IMPAIRMENTS Abnormal gait, decreased activity tolerance, decreased balance, difficulty walking, decreased ROM, decreased strength, increased edema, improper body mechanics, and pain.    ACTIVITY LIMITATIONS carrying, lifting, standing, squatting, stairs, and locomotion level   PARTICIPATION LIMITATIONS: meal prep, cleaning, laundry, shopping, community activity, occupation, and yard work   PERSONAL FACTORS Age, Profession, and Time since onset of injury/illness/exacerbation are also affecting patient's functional outcome.      GOALS: Goals reviewed with patient? No   SHORT TERM GOALS: Target date: 06/03/2022   Patient will be independent and compliant with initial HEP.  Baseline: issued at eval  Goal status: achieved    2.  Patient will be able to perform SL calf raise on the LLE to improve gait mechanics.  Baseline: unable  Goal status: achieved    3.  Patient will demonstrate proper lifting mechanics without an increase in Lt foot pain.  Baseline: see above  Goal status: ongoing  LONG TERM GOALS: Target date: 06/24/2022    Patient will demonstrate at least 4/5 Lt ankle inversion and eversion strength to improve her gait stability.   Baseline: see above  Goal status: achieved    2.  Patient will maintain SLS on the LLE for at least 15 seconds to improve her stability with walking on uneven terrain.  Baseline: see above  Goal status: achieved    3.  Patient will report pain at worst rated as 5/10 to improve her tolerance to walking and standing activity.  Baseline: see above  Goal status: ongoing      PLAN: PT FREQUENCY: 1-2/week   PT DURATION: 4 weeks   PLANNED INTERVENTIONS: Therapeutic exercises, Therapeutic activity, Neuromuscular re-education, Balance training, Gait training, Patient/Family education, Self Care, Joint mobilization, Stair training, Dry Needling, Electrical stimulation, Cryotherapy, Moist heat, Vasopneumatic device, Ultrasound, Ionotophoresis 4mg /ml Dexamethasone, Manual therapy, and Re-evaluation   PLAN FOR NEXT SESSION: . review/update HEP PRN, progress ankle strengthening as tolerated, manual to peroneals/plantar fascia, foot intrinsic strengthening, CKC strengthening    , PT, DPT, LAT, ATC 07/16/22  5:31 PM Phone: [phone] Fax: [phone]

## 2022-07-22 NOTE — Therapy (Signed)
OUTPATIENT PHYSICAL THERAPY TREATMENT NOTE   Patient Name: Crystal Whitaker MRN: [id number] DOB:1966/09/07, 56 y.o., female Today's Date: 07/23/2022  PCP: Marcine Matar, MD   REFERRING PROVIDER: Edwin Cap, DPM    END OF SESSION:   PT End of Session - 07/23/22 1700     Visit Number 14    Number of Visits 19    Date for PT Re-Evaluation 08/02/22    Authorization Type [employer]    PT Start Time 1655    PT Stop Time 1733    PT Time Calculation (min) 38 min    Activity Tolerance Patient tolerated treatment well    Behavior During Therapy WFL for tasks assessed/performed                       Past Medical History:  Diagnosis Date   Back pain    Hypertension    Sinusitis    Past Surgical History:  Procedure Laterality Date   CESAREAN SECTION     Patient Active Problem List   Diagnosis Date Noted   Elevated blood pressure reading in office with diagnosis of hypertension 08/06/2021   Mixed hyperlipidemia 08/06/2021   Headache(784.0) 02/22/2014   Knee pain, acute 04/21/2013   Essential hypertension 12/15/2008   DEGENERATIVE JOINT DISEASE, SHOULDER 12/15/2008   LOW BACK PAIN SYNDROME 08/02/2007    REFERRING DIAG: Plantar fasciitis of left foot, Left peroneal tendinosis   THERAPY DIAG:  Pain in left ankle and joints of left foot  Muscle weakness (generalized)  Difficulty in walking, not elsewhere classified  Localized edema  Rationale for Evaluation and Treatment Rehabilitation  PERTINENT HISTORY: None  PRECAUTIONS: None   SUBJECTIVE: Patient reports she is good, she has a little pain but not bad. Everything at work is going ok.  Declined interpreter   PAIN:  Are you having pain? None currently At worst: NPRS scale: 3/10 Pain location: Left lateral ankle Pain description: Unable to describe; intermittent Aggravating factors: walking, standing  Relieving factors: rest, medication  PATIENT GOALS "feel better"   OBJECTIVE:  (objective measures completed at initial evaluation unless otherwise dated) EDEMA:  Mild non-pitting edema about the Lt lateral ankle    PALPATION: TTP    LOWER EXTREMITY ROM:   Active ROM Right eval Left eval Rt / Lt 05/20/2022 06/30/22 Left  Hip flexion        Hip extension        Hip abduction        Hip adduction        Hip internal rotation        Hip external rotation        Knee flexion        Knee extension        Ankle dorsiflexion 5 2 8  / 6 deg 11  Ankle plantarflexion WNL WNL  WNL  Ankle inversion WNL WNL pn!  WNL pn!  Ankle eversion WNL WNL pn!  WNL pn!   (Blank rows = not tested)   LOWER EXTREMITY MMT:   MMT Right eval Left eval Left 05/29/2022 06/03/22 06/10/22 06/16/2022 06/19/22 06/30/22 Left  Left 07/23/2022  Hip flexion             Hip extension             Hip abduction         4-5/ bilateral     Hip adduction             Hip  internal rotation             Hip external rotation             Knee flexion             Knee extension             Ankle dorsiflexion 5 5 5     5    Ankle plantarflexion Able to complete full range calf raise  Unable to complete calf raise  4 Lt: partial range SL calf raise, increased pain Lt: full range SL calf raise    Full range SL calf raise pn! 4  Ankle inversion 5 4- 4   4  5    Ankle eversion 5 3+ pain 4   4  4+ pn! 4+   (Blank rows = not tested)   LOWER EXTREMITY SPECIAL TESTS:  N/A   FUNCTIONAL TESTS:  SLS 7 seconds LLE; 30 seconds RLE   06/16/2022: SLS left 20 seconds Lifting: no hip or knee flexion; completes by flexing only at the lumbar spine; pain   06/30/22: 30 seconds LLE  Lifting: either only flexes at her knees or her trunk; limited hip flexion, increased foot pain    GAIT: Distance walked: 20 ft  Assistive device utilized: None Level of assistance: Complete Independence Comments: excessive pronation; decreased push off on the LLE; 06/30/22: excessive pronation, decreased push-off    TODAY'S  TREATMENT [facility] Adult PT Treatment:                                                DATE: 07/23/22 Therapeutic Exercise: NuStep L6 x 5 min with UE/LE while taking subjective Slant board calf stretch 3 x 30 sec 4-way ankle with green 2 x 20 Squat to chair holding 10# at chest 2 x 10  Heel raises 2 x 20 Standing hip abduction with green at ankles 2 x 15 each Rockerboard fwd/bwd and lateral taps 2 x 1 min each SLS 2 x 30 sec each Tandem stance on Airex 2 x 30 sec each SL heel raises 2 x 15 each   [facility] Adult PT Treatment:                                                DATE: 07/16/22 Therapeutic Exercise: NuStep L6 x 5 min with UE/LE while taking subjective Slant board calf stretch 3 x 30 sec Heel raises 2 x 20 Tandem stance 2 x 30 sec each Left SL heel raises 2 x 10 SLS 2 x 30 sec each 4-way ankle with green 2 x 20 Squat to chair x 15, holding 10# at chest 2 x 10   [facility] Adult PT Treatment:                                                DATE: 07/03/22 Therapeutic Exercise: NuStep level 6 x 5 minutes  Calf stretch on wedge x 1 minute  Calf raise with ball between ankles 2 x 10 SL calf raise on leg press 2 x 10 @ 20 lbs   Leg  press 2 x 10 @ 60 lbs  Squat to chair 2 x 10  Towel slides inversion/eversion x 2 each 2# Ankle circles 2 x 10; CW/CCW 2#  PATIENT EDUCATION:  Education details: HEP Person educated: Patient Education method: Explanation Education comprehension: Verbalized understanding    HOME EXERCISE PROGRAM: Access Code: [email or web address]    ASSESSMENT: CLINICAL IMPRESSION: Patient tolerated therapy well with no adverse effects. Therapy continues to focus on progressing strengthening and single leg control with good tolerance. She does continue to report mild lateral left ankle pain but this seems to be improving and does not limit her with work related activities. She did not report any increased pain with exercises this visit but does demonstrate continue strength deficit with  single leg heel raises on the left. No changes to HEP this visit. Patient would benefit from continued skilled PT to progress her mobility and strength in order to reduce pain and maximize functional ability.    OBJECTIVE IMPAIRMENTS Abnormal gait, decreased activity tolerance, decreased balance, difficulty walking, decreased ROM, decreased strength, increased edema, improper body mechanics, and pain.    ACTIVITY LIMITATIONS carrying, lifting, standing, squatting, stairs, and locomotion level   PARTICIPATION LIMITATIONS: meal prep, cleaning, laundry, shopping, community activity, occupation, and yard work   PERSONAL FACTORS Age, Profession, and Time since onset of injury/illness/exacerbation are also affecting patient's functional outcome.      GOALS: Goals reviewed with patient? No   SHORT TERM GOALS: Target date: 06/03/2022   Patient will be independent and compliant with initial HEP.  Baseline: issued at eval  Goal status: achieved    2.  Patient will be able to perform SL calf raise on the LLE to improve gait mechanics.  Baseline: unable  Goal status: achieved    3.  Patient will demonstrate proper lifting mechanics without an increase in Lt foot pain.  Baseline: see above  Goal status: ongoing  LONG TERM GOALS: Target date: 08/02/2022   Patient will demonstrate at least 4/5 Lt ankle inversion and eversion strength to improve her gait stability.   Baseline: see above  Goal status: achieved    2.  Patient will maintain SLS on the LLE for at least 15 seconds to improve her stability with walking on uneven terrain.  Baseline: see above  Goal status: achieved    3.  Patient will report pain at worst rated as 5/10 to improve her tolerance to walking and standing activity.  Baseline: see above  Goal status: ongoing     PLAN: PT FREQUENCY: 1-2/week   PT DURATION: 4 weeks   PLANNED INTERVENTIONS: Therapeutic exercises, Therapeutic activity, Neuromuscular re-education,  Balance training, Gait training, Patient/Family education, Self Care, Joint mobilization, Stair training, Dry Needling, Electrical stimulation, Cryotherapy, Moist heat, Vasopneumatic device, Ultrasound, Ionotophoresis 4mg /ml Dexamethasone, Manual therapy, and Re-evaluation   PLAN FOR NEXT SESSION: . review/update HEP PRN, progress ankle strengthening as tolerated, manual to peroneals/plantar fascia, foot intrinsic strengthening, CKC strengthening    , PT, DPT, LAT, ATC 07/23/22  5:35 PM Phone: [phone] Fax: [phone]

## 2022-07-23 ENCOUNTER — Encounter: Payer: Self-pay | Admitting: Physical Therapy

## 2022-07-23 ENCOUNTER — Other Ambulatory Visit: Payer: Self-pay

## 2022-07-23 ENCOUNTER — Ambulatory Visit: Payer: Commercial Managed Care - HMO | Attending: Podiatry | Admitting: Physical Therapy

## 2022-07-23 DIAGNOSIS — R262 Difficulty in walking, not elsewhere classified: Secondary | ICD-10-CM | POA: Diagnosis present

## 2022-07-23 DIAGNOSIS — M6281 Muscle weakness (generalized): Secondary | ICD-10-CM | POA: Diagnosis present

## 2022-07-23 DIAGNOSIS — M25572 Pain in left ankle and joints of left foot: Secondary | ICD-10-CM

## 2022-07-23 DIAGNOSIS — R600 Localized edema: Secondary | ICD-10-CM | POA: Diagnosis present

## 2022-07-30 NOTE — Therapy (Signed)
OUTPATIENT PHYSICAL THERAPY TREATMENT NOTE PHYSICAL THERAPY DISCHARGE SUMMARY  Visits from Start of Care: 15  Current functional level related to goals / functional outcomes: See goals below   Remaining deficits: Lt lateral ankle pain Lt ankle strength deficits   Education / Equipment: See education    Patient agrees to discharge. Patient goals were partially met. Patient is being discharged due to maximized rehab potential.    Patient Name: Crystal Whitaker MRN: [id number] DOB:[birth year], 56 y.o., female Today's Date: 07/31/2022  PCP: Ladell Pier, MD   REFERRING PROVIDER: Criselda Peaches, DPM    END OF SESSION:   PT End of Session - 07/31/22 1610     Visit Number 15    Number of Visits 19    Date for PT Re-Evaluation 08/02/22    Authorization Type [employer]    PT Start Time 1610    PT Stop Time 1648    PT Time Calculation (min) 38 min    Activity Tolerance Patient tolerated treatment well    Behavior During Therapy WFL for tasks assessed/performed                        Past Medical History:  Diagnosis Date   Back pain    Hypertension    Sinusitis    Past Surgical History:  Procedure Laterality Date   CESAREAN SECTION     Patient Active Problem List   Diagnosis Date Noted   Elevated blood pressure reading in office with diagnosis of hypertension 08/06/2021   Mixed hyperlipidemia 08/06/2021   Headache(784.0) 02/22/2014   Knee pain, acute 04/21/2013   Essential hypertension 12/15/2008   DEGENERATIVE JOINT DISEASE, SHOULDER 12/15/2008   LOW BACK PAIN SYNDROME 08/02/2007    REFERRING DIAG: Plantar fasciitis of left foot, Left peroneal tendinosis   THERAPY DIAG:  Pain in left ankle and joints of left foot  Muscle weakness (generalized)  Difficulty in walking, not elsewhere classified  Localized edema  Rationale for Evaluation and Treatment Rehabilitation  PERTINENT HISTORY: None  PRECAUTIONS: None   SUBJECTIVE: Patient  reports she is "not good." She has been noticing more swelling and pain about the lateral ankle since being on her feet at work all day. She has plans to schedule a f/u with referring provider.   Declined interpreter   PAIN:  Are you having pain? yes NPRS scale: 7/10 Pain location: Left lateral ankle Pain description: Unable to describe Aggravating factors: walking, standing  Relieving factors: rest, medication  PATIENT GOALS "feel better"   OBJECTIVE: (objective measures completed at initial evaluation unless otherwise dated) EDEMA:  Mild non-pitting edema about the Lt lateral ankle    PALPATION: TTP    LOWER EXTREMITY ROM:   Active ROM Right eval Left eval Rt / Lt 05/20/2022 06/30/22 Left 07/31/22 Left   Hip flexion         Hip extension         Hip abduction         Hip adduction         Hip internal rotation         Hip external rotation         Knee flexion         Knee extension         Ankle dorsiflexion _0 / 6 deg 11 WNL  Ankle plantarflexion WNL WNL  WNL WNL pn!  Ankle inversion WNL WNL pn!  WNL pn! WNL  Ankle eversion  WNL WNL pn!  WNL pn! WNL pn!   (Blank rows = not tested)   LOWER EXTREMITY MMT:   MMT Right eval Left eval Left 05/29/2022 06/03/22 06/10/22 06/16/2022 06/19/22 06/30/22 Left  Left 07/23/2022 Left 07/31/22  Hip flexion              Hip extension              Hip abduction         4-5/ bilateral      Hip adduction              Hip internal rotation              Hip external rotation              Knee flexion              Knee extension              Ankle dorsiflexion _0 Ankle plantarflexion Able to complete full range calf raise  Unable to complete calf raise  4 Lt: partial range SL calf raise, increased pain Lt: full range SL calf raise    Full range SL calf raise pn! 4 Full range SL calf raise pn!  Ankle inversion 5 4- _1 Ankle eversion 5 3+ pain 4   4  4+ pn! 4+ 4 pn   (Blank rows = not tested)   LOWER  EXTREMITY SPECIAL TESTS:  N/A   FUNCTIONAL TESTS:  SLS 7 seconds LLE; 30 seconds RLE   06/16/2022: SLS left 20 seconds     Lifting: no hip or knee flexion; completes by flexing only at the lumbar spine; pain   06/30/22: 30 seconds LLE  Lifting: either only flexes at her knees or her trunk; limited hip flexion, increased foot pain   07/31/22: SLS left 30 seconds; minor pain  Squatting: excessive knee flexion    GAIT: Distance walked: 20 ft  Assistive device utilized: None Level of assistance: Complete Independence Comments: excessive pronation; decreased push off on the LLE; 06/30/22: excessive pronation, decreased push-off    TODAY'S TREATMENT [facility] Adult PT Treatment:                                                DATE: 07/31/22 Therapeutic Exercise: NuStep level 7 x 5 minutes  Standing calf raises x 5 Standing heel raises x 5 Hip brige x 5 SLR x 5 Hip abduction x 5 SLS x 30 sec each  Calf stretch x 30 sec Resisted PF x 5 black band Resisted eversion x 5 red band Reviewed and updated HEP   Therapeutic Activity: Re-assessment to determine overall progress educating patient on progress towards goals.   Self Care: Discussed modalities and interventions for swelling (ice, ankle pumps, elevation)    [facility] Adult PT Treatment:                                                DATE: 07/23/22 Therapeutic Exercise: NuStep L6 x 5 min with UE/LE while taking subjective Slant board calf stretch 3 x 30 sec 4-way ankle with  green 2 x 20 Squat to chair holding 10# at chest 2 x 10  Heel raises 2 x 20 Standing hip abduction with green at ankles 2 x 15 each Rockerboard fwd/bwd and lateral taps 2 x 1 min each SLS 2 x 30 sec each Tandem stance on Airex 2 x 30 sec each SL heel raises 2 x 15 each   [facility] Adult PT Treatment:                                                DATE: 07/16/22 Therapeutic Exercise: NuStep L6 x 5 min with UE/LE while taking subjective Slant board calf stretch 3 x  30 sec Heel raises 2 x 20 Tandem stance 2 x 30 sec each Left SL heel raises 2 x 10 SLS 2 x 30 sec each 4-way ankle with green 2 x 20 Squat to chair x 15, holding 10# at chest 2 x 10   [facility] Adult PT Treatment:                                                DATE: 07/03/22 Therapeutic Exercise: NuStep level 6 x 5 minutes  Calf stretch on wedge x 1 minute  Calf raise with ball between ankles 2 x 10 SL calf raise on leg press 2 x 10 @ 20 lbs   Leg press 2 x 10 @ 60 lbs  Squat to chair 2 x 10  Towel slides inversion/eversion x 2 each 2# Ankle circles 2 x 10; CW/CCW 2#  PATIENT EDUCATION:  Education details: see treatment; d/c education Person educated: Patient Education method: Consulting civil engineer, demo, handout Education comprehension: Verbalized understanding, returned demo    HOME EXERCISE PROGRAM: Access Code: [email or web address]    ASSESSMENT: CLINICAL IMPRESSION: Patient has attended 15 PT sessions since the start of care reporting a recent increase in Lt lateral ankle pain and swelling secondary to prolonged standing since returning to work. While she demonstrates improvements in her Lt ankle strength, ROM, and balance since the start of care she reports a worsening of pain since returning to work, where she stands for the majority of her 8 hour shift. She has maximized her rehab potential and is appropriate for discharge at this time with recommendation to f/u with Dr. Sherryle Lis for further assessment of ankle pain with patient in agreement with this plan.     OBJECTIVE IMPAIRMENTS Abnormal gait, decreased activity tolerance, decreased balance, difficulty walking, decreased ROM, decreased strength, increased edema, improper body mechanics, and pain.    ACTIVITY LIMITATIONS carrying, lifting, standing, squatting, stairs, and locomotion level   PARTICIPATION LIMITATIONS: meal prep, cleaning, laundry, shopping, community activity, occupation, and yard work   PERSONAL FACTORS Age, Profession, and  Time since onset of injury/illness/exacerbation are also affecting patient's functional outcome.      GOALS: Goals reviewed with patient? No   SHORT TERM GOALS: Target date: 06/03/2022   Patient will be independent and compliant with initial HEP.  Baseline: issued at eval  Goal status: achieved    2.  Patient will be able to perform SL calf raise on the LLE to improve gait mechanics.  Baseline: unable  Goal status: achieved    3.  Patient will demonstrate proper lifting mechanics  without an increase in Lt foot pain.  Baseline: see above  Goal status: not me   LONG TERM GOALS: Target date: 08/02/2022   Patient will demonstrate at least 4/5 Lt ankle inversion and eversion strength to improve her gait stability.   Baseline: see above  Goal status: achieved    2.  Patient will maintain SLS on the LLE for at least 15 seconds to improve her stability with walking on uneven terrain.  Baseline: see above  Goal status: achieved    3.  Patient will report pain at worst rated as 5/10 to improve her tolerance to walking and standing activity.  Baseline: see above  Goal status: not met     PLAN: PT FREQUENCY: n/a   PT DURATION: n/a   PLANNED INTERVENTIONS: Therapeutic exercises, Therapeutic activity, Neuromuscular re-education, Balance training, Gait training, Patient/Family education, Self Care, Joint mobilization, Stair training, Dry Needling, Electrical stimulation, Cryotherapy, Moist heat, Vasopneumatic device, Ultrasound, Ionotophoresis 61m/ml Dexamethasone, Manual therapy, and Re-evaluation   PLAN FOR NEXT SESSION: . NMarin Roberts PT, DPT, ATC 07/31/22 4:56 PM

## 2022-07-31 ENCOUNTER — Ambulatory Visit: Payer: Commercial Managed Care - HMO

## 2022-07-31 DIAGNOSIS — M25572 Pain in left ankle and joints of left foot: Secondary | ICD-10-CM

## 2022-07-31 DIAGNOSIS — R600 Localized edema: Secondary | ICD-10-CM

## 2022-07-31 DIAGNOSIS — R262 Difficulty in walking, not elsewhere classified: Secondary | ICD-10-CM

## 2022-07-31 DIAGNOSIS — M6281 Muscle weakness (generalized): Secondary | ICD-10-CM

## 2022-09-05 ENCOUNTER — Encounter: Payer: Self-pay | Admitting: Internal Medicine

## 2022-09-05 ENCOUNTER — Ambulatory Visit: Payer: 59 | Attending: Internal Medicine | Admitting: Internal Medicine

## 2022-09-05 VITALS — BP 132/73 | HR 73 | Temp 98.1°F

## 2022-09-05 DIAGNOSIS — M79671 Pain in right foot: Secondary | ICD-10-CM | POA: Diagnosis not present

## 2022-09-05 DIAGNOSIS — M79672 Pain in left foot: Secondary | ICD-10-CM

## 2022-09-05 DIAGNOSIS — Z1231 Encounter for screening mammogram for malignant neoplasm of breast: Secondary | ICD-10-CM

## 2022-09-05 DIAGNOSIS — E782 Mixed hyperlipidemia: Secondary | ICD-10-CM

## 2022-09-05 DIAGNOSIS — I10 Essential (primary) hypertension: Secondary | ICD-10-CM | POA: Diagnosis not present

## 2022-09-05 DIAGNOSIS — Z1211 Encounter for screening for malignant neoplasm of colon: Secondary | ICD-10-CM

## 2022-09-05 MED ORDER — ROSUVASTATIN CALCIUM 10 MG PO TABS: 10.0000 mg | ORAL_TABLET | Freq: Every day | ORAL | 1 refills | Status: DC

## 2022-09-05 MED ORDER — POTASSIUM CHLORIDE ER 10 MEQ PO TBCR: EXTENDED_RELEASE_TABLET | ORAL | 1 refills | Status: DC

## 2022-09-05 MED ORDER — HYDROCHLOROTHIAZIDE 25 MG PO TABS: 25.0000 mg | ORAL_TABLET | Freq: Every day | ORAL | 1 refills | Status: DC

## 2022-09-05 MED ORDER — LISINOPRIL 20 MG PO TABS: 20.0000 mg | ORAL_TABLET | Freq: Every day | ORAL | 1 refills | Status: DC

## 2022-09-05 MED ORDER — DICLOFENAC SODIUM 1 % EX GEL: 2.0000 g | Freq: Four times a day (QID) | CUTANEOUS | 1 refills | Status: DC

## 2022-09-05 NOTE — Progress Notes (Signed)
Patient ID: Crystal Whitaker, female    DOB: 02/08/1966  MRN: [id number]  CC: Hypertension (Htn f/u. Would like to check cholesterol & HTN. Ottis Stain on L anke, sweling, stabbing pain X2 weeks./Refill on meds. /No to flu vax.)   Subjective: Crystal Whitaker is a 56 y.o. female who presents for chronic ds management Her concerns today include:  Patient with history of HTN, lower back pain syndrome, pre-DM (last A1C 08/2020 5.4), HL   HTN: Reports compliance with taking hydrochlorothiazide 25 mg daily and lisinopril 20 mg daily.  She is also on potassium supplement which she takes daily.  No chest pains or shortness of breath. No device to check BP Limits salt in foods  HL: Taking and tolerating Crestor 10 mg daily.  Complain of pain in both feet.  Pain in the right foot is on the dorsal midfoot x3 weeks.  No injury.  Pain is intermittent.  She rates it as 7 out of 10. Pain in the left foot is over the lateral malleolus and on the sole of the foot x3 months.  Diagnosed with plantars fasciitis of the left foot by podiatry several months ago.  She did some physical therapy at that time and found it helpful.  However symptoms have returned.  She does a lot of standing at work and has to wear protective steel toe boots.  She has placed Dr. Margart Sickles insert into her boots which helps.Marland Kitchen    HM:  Wants to get flu and shingrix next wk.  Due for MMG Patient Active Problem List   Diagnosis Date Noted   Elevated blood pressure reading in office with diagnosis of hypertension 08/06/2021   Mixed hyperlipidemia 08/06/2021   Headache(784.0) 02/22/2014   Knee pain, acute 04/21/2013   Essential hypertension 12/15/2008   DEGENERATIVE JOINT DISEASE, SHOULDER 12/15/2008   LOW BACK PAIN SYNDROME 08/02/2007     Current Outpatient Medications on File Prior to Visit  Medication Sig Dispense Refill   acetaminophen (TYLENOL) 500 MG tablet Take 2 tablets (1,000 mg total) by mouth every 6 (six) hours as needed for  moderate pain. 30 tablet 0   hydrochlorothiazide (HYDRODIURIL) 25 MG tablet Take 1 tablet (25 mg total) by mouth daily. 90 tablet 0   lisinopril (ZESTRIL) 20 MG tablet Take 1 tablet (20 mg total) by mouth daily. To lower blood pressure 90 tablet 0   meloxicam (MOBIC) 15 MG tablet Take 1 tablet (15 mg total) by mouth daily. 30 tablet 3   methylPREDNISolone (MEDROL DOSEPAK) 4 MG TBPK tablet FOLLOW PACKAGE DIRECTIONS 21 each 0   potassium chloride (KLOR-CON) 10 MEQ tablet TAKE 2 TABLETS(20 MEQ) BY MOUTH DAILY 180 tablet 0   rosuvastatin (CRESTOR) 10 MG tablet Take 1 tablet (10 mg total) by mouth daily. To lower cholesterol 90 tablet 0   triamcinolone cream (KENALOG) 0.1 % Apply 1 application topically 2 (two) times daily. To area of skin rash 30 g 0   diclofenac sodium (VOLTAREN) 1 % GEL Apply 4 g topically 4 (four) times daily. As needed to relieve pain in left knee (Patient not taking: Reported on 09/05/2022) 150 g 4   No current facility-administered medications on file prior to visit.    No Known Allergies  Social History   Socioeconomic History   Marital status: Married    Spouse name: Not on file   Number of children: Not on file   Years of education: Not on file   Highest education level: Not on file  Occupational  History   Not on file  Tobacco Use   Smoking status: Never   Smokeless tobacco: Never  Substance and Sexual Activity   Alcohol use: No   Drug use: No   Sexual activity: Not Currently  Other Topics Concern   Not on file  Social History Narrative   Not on file   Social Determinants of Health   Financial Resource Strain: Not on file  Food Insecurity: Not on file  Transportation Needs: Not on file  Physical Activity: Not on file  Stress: Not on file  Social Connections: Not on file  Intimate Partner Violence: Not on file    Family History  Problem Relation Age of Onset   Hypertension Mother    Hypertension Sister    Hypertension Brother     Past  Surgical History:  Procedure Laterality Date   CESAREAN SECTION      ROS: Review of Systems Negative except as stated above  PHYSICAL EXAM: BP 132/73 (BP Location: Left Arm, Patient Position: Sitting, Cuff Size: Normal)   Pulse 73   Temp 98.1 F (36.7 C) (Oral)   LMP 06/04/2011   SpO2 97%   Physical Exam  General appearance - alert, well appearing, and in no distress Mental status - normal mood, behavior, speech, dress, motor activity, and thought processes Chest - clear to auscultation, no wheezes, rales or rhonchi, symmetric air entry Heart - normal rate, regular rhythm, normal S1, S2, no murmurs, rubs, clicks or gallops Musculoskeletal -right foot: No edema or erythema noted.  Good range of motion of the ankle.  No tenderness on palpation of the midfoot. Left foot: No edema or erythema noted over the left malleolus.  No point tenderness.  Mild tenderness on palpation on the sole of the foot. Extremities - peripheral pulses normal, no pedal edema, no clubbing or cyanosis      Latest Ref Rng & Units 02/25/2021    3:17 PM 08/24/2020   10:43 AM 02/20/2020    9:25 AM  CMP  Glucose 65 - 99 mg/dL 87  92  545   BUN 6 - 24 mg/dL 14  12  9    Creatinine 0.57 - 1.00 mg/dL  6.25  6.38   Sodium 134 - 144 mmol/L 141  140  139   Potassium 3.5 - 5.2 mmol/L 3.9  3.3  4.2   Chloride 96 - 106 mmol/L 102  102  101   CO2 20 - 29 mmol/L 25  28  24    Calcium 8.7 - 10.2 mg/dL 9.6  9.5  9.7   Total Protein 6.0 - 8.5 g/dL 6.8  6.7  6.8   Total Bilirubin 0.0 - 1.2 mg/dL 0.4  0.4  9.37   Alkaline Phos 44 - 121 IU/L 64  67  71   AST 0 - 40 IU/L 15  14  14    ALT 0 - 32 IU/L 12  9  10     Lipid Panel     Component Value Date/Time   CHOL 245 (H) 02/25/2021 1517   TRIG 83 02/25/2021 1517   HDL 68 02/25/2021 1517   CHOLHDL 3.6 02/25/2021 1517   CHOLHDL 4.0 10/23/2016 0954   VLDL 29 10/23/2016 0954   LDLCALC 163 (H) 02/25/2021 1517    CBC    Component Value Date/Time   WBC 4.7 02/25/2021  1517   WBC 14.4 (H) 02/28/2014 0848   RBC 4.52 02/25/2021 1517   RBC 4.29 02/28/2014 0848   HGB 11.9 02/25/2021  1517   HCT 37.3 02/25/2021 1517   PLT 240 02/25/2021 1517   MCV 83 02/25/2021 1517   MCH 26.3 (L) 02/25/2021 1517   MCH 27.0 02/28/2014 0848   MCHC 31.9 02/25/2021 1517   MCHC 33.0 02/28/2014 0848   RDW 14.3 02/25/2021 1517   LYMPHSABS 0.8 02/28/2014 0848   MONOABS 0.8 02/28/2014 0848   EOSABS 0.0 02/28/2014 0848   BASOSABS 0.0 02/28/2014 0848    ASSESSMENT AND PLAN:  1. Essential hypertension Close to goal.  Continue lisinopril, hydrochlorothiazide and potassium supplement. - CBC - Comprehensive metabolic panel - hydrochlorothiazide (HYDRODIURIL) 25 MG tablet; Take 1 tablet (25 mg total) by mouth daily.  Dispense: 90 tablet; Refill: 1 - lisinopril (ZESTRIL) 20 MG tablet; Take 1 tablet (20 mg total) by mouth daily. To lower blood pressure  Dispense: 90 tablet; Refill: 1 - potassium chloride (KLOR-CON) 10 MEQ tablet; TAKE 2 TABLETS(20 MEQ) BY MOUTH DAILY  Dispense: 180 tablet; Refill: 1  2. Mixed hyperlipidemia Due for lipid profile.  Continue Crestor. - Lipid panel - rosuvastatin (CRESTOR) 10 MG tablet; Take 1 tablet (10 mg total) by mouth daily. To lower cholesterol  Dispense: 90 tablet; Refill: 1  3. Encounter for screening mammogram for malignant neoplasm of breast  - MM Digital Screening; Future  4. Screening for colon cancer - Fecal occult blood, imunochemical([facility])  5. Pain in both feet I think she has reoccurrence of planta fasciitis on the left foot.  I recommend alternating hot and warm compresses and heel exercises.  Continue to wear good inserts in shoes.  Patient would like some type of anti-inflammatory cream instead of anti-inflammatory pill.  I will send a prescription to the pharmacy for Voltaren gel.  I told her that she can also use this on the dorsal surface of the right foot.    Patient was given the opportunity to ask questions.   Patient verbalized understanding of the plan and was able to repeat key elements of the plan.   This documentation was completed using Paediatric nurse.  Any transcriptional errors are unintentional.  No orders of the defined types were placed in this encounter.    Requested Prescriptions    No prescriptions requested or ordered in this encounter    No follow-ups on file.  Jonah Blue, MD, FACP

## 2022-09-06 LAB — COMPREHENSIVE METABOLIC PANEL
ALT: 15 IU/L (ref 0–32)
AST: 16 IU/L (ref 0–40)
Albumin/Globulin Ratio: 1.8 (ref 1.2–2.2)
Albumin: 4.4 g/dL (ref 3.8–4.9)
Alkaline Phosphatase: 64 IU/L (ref 44–121)
BUN/Creatinine Ratio: 24 — ABNORMAL HIGH (ref 9–23)
BUN: 16 mg/dL (ref 6–24)
Bilirubin Total: 0.2 mg/dL (ref 0.0–1.2)
CO2: 29 mmol/L (ref 20–29)
Calcium: 9.7 mg/dL (ref 8.7–10.2)
Chloride: 98 mmol/L (ref 96–106)
Creatinine, Ser: 0.67 mg/dL (ref 0.57–1.00)
Globulin, Total: 2.4 g/dL (ref 1.5–4.5)
Glucose: 102 mg/dL — ABNORMAL HIGH (ref 70–99)
Potassium: 3.7 mmol/L (ref 3.5–5.2)
Sodium: 139 mmol/L (ref 134–144)
Total Protein: 6.8 g/dL (ref 6.0–8.5)
eGFR: 101 mL/min/{1.73_m2} (ref >59)

## 2022-09-06 LAB — LIPID PANEL
Chol/HDL Ratio: 4.8 ratio — ABNORMAL HIGH (ref 0.0–4.4)
Cholesterol, Total: 253 mg/dL — ABNORMAL HIGH (ref 100–199)
HDL: 53 mg/dL (ref >39)
LDL Chol Calc (NIH): 173 mg/dL — ABNORMAL HIGH (ref 0–99)
Triglycerides: 147 mg/dL (ref 0–149)
VLDL Cholesterol Cal: 27 mg/dL (ref 5–40)

## 2022-09-06 LAB — CBC
Hematocrit: 36.3 % (ref 34.0–46.6)
Hemoglobin: 11.8 g/dL (ref 11.1–15.9)
MCH: 27 pg (ref 26.6–33.0)
MCHC: 32.5 g/dL (ref 31.5–35.7)
MCV: 83 fL (ref 79–97)
Platelets: 224 10*3/uL (ref 150–450)
RBC: 4.37 x10E6/uL (ref 3.77–5.28)
RDW: 13.5 % (ref 11.7–15.4)
WBC: 4.2 10*3/uL (ref 3.4–10.8)

## 2022-09-10 ENCOUNTER — Ambulatory Visit: Payer: 59

## 2022-09-25 ENCOUNTER — Other Ambulatory Visit: Payer: Self-pay | Admitting: Internal Medicine

## 2022-09-25 DIAGNOSIS — E782 Mixed hyperlipidemia: Secondary | ICD-10-CM

## 2022-09-25 NOTE — Telephone Encounter (Signed)
Requested Prescriptions  Pending Prescriptions Disp Refills   rosuvastatin (CRESTOR) 10 MG tablet [Pharmacy Med Name: ROSUVASTATIN 10MG  TABLETS] 90 tablet 1    Sig: TAKE 1 TABLET(10 MG) BY MOUTH DAILY FOR CHOLESTEROL     Cardiovascular:  Antilipid - Statins 2 Failed - 09/25/2022  5:22 PM      Failed - Lipid Panel in normal range within the last 12 months    Cholesterol, Total  Date Value Ref Range Status  09/05/2022 253 (H) 100 - 199 mg/dL Final   LDL Chol Calc (NIH)  Date Value Ref Range Status  09/05/2022 173 (H) 0 - 99 mg/dL Final   HDL  Date Value Ref Range Status  09/05/2022 53 >39 mg/dL Final   Triglycerides  Date Value Ref Range Status  09/05/2022 147 0 - 149 mg/dL Final         Passed - Cr in normal range and within 360 days    Creat  Date Value Ref Range Status  10/23/2016 0.45 (L) 0.50 - 1.05 mg/dL Final    Comment:      For patients > or = 58 years of age: The upper reference limit for Creatinine is approximately 13% higher for people identified as African-American.      Creatinine, Ser  Date Value Ref Range Status  09/05/2022 0.67 0.57 - 1.00 mg/dL Final   Creatinine, Urine  Date Value Ref Range Status  10/23/2016 126 20 - 320 mg/dL Final         Passed - Patient is not pregnant      Passed - Valid encounter within last 12 months    Recent Outpatient Visits           2 weeks ago Essential hypertension   [facility] 12/21/2016, MD   10 months ago Essential hypertension   [facility] Marcine Matar, MD   1 year ago Elevated blood pressure reading in office with diagnosis of hypertension   [facility] [place], Cari S, 12-12-1985   1 year ago Essential hypertension   [facility] [place], MD   2 years ago Essential hypertension   [facility] Marcine Matar, MD       Future  Appointments             In 3 months Cain Saupe, Laural Benes, MD [facility]: [facility]

## 2022-10-28 ENCOUNTER — Ambulatory Visit: Payer: BLUE CROSS/BLUE SHIELD | Admitting: Podiatry

## 2022-10-28 ENCOUNTER — Encounter: Payer: Self-pay | Admitting: Podiatry

## 2022-10-28 DIAGNOSIS — M79672 Pain in left foot: Secondary | ICD-10-CM | POA: Diagnosis not present

## 2022-10-28 DIAGNOSIS — M79671 Pain in right foot: Secondary | ICD-10-CM | POA: Diagnosis not present

## 2022-10-28 DIAGNOSIS — M898X7 Other specified disorders of bone, ankle and foot: Secondary | ICD-10-CM

## 2022-10-28 DIAGNOSIS — G5790 Unspecified mononeuropathy of unspecified lower limb: Secondary | ICD-10-CM

## 2022-10-28 MED ORDER — DICLOFENAC SODIUM 1 % EX GEL: 2.0000 g | Freq: Four times a day (QID) | CUTANEOUS | 1 refills | Status: AC

## 2022-10-28 MED ORDER — METHYLPREDNISOLONE 4 MG PO TBPK: ORAL_TABLET | ORAL | 0 refills | Status: DC

## 2022-10-28 NOTE — Patient Instructions (Signed)
Re-lace your safety shoes so the middle of the laces do not compress over the top of the arch, skip the middle holes and tie at the top only

## 2022-10-28 NOTE — Progress Notes (Signed)
  Subjective:  Patient ID: Crystal Whitaker, female    DOB: 1966/08/31,  MRN: [id number]  Chief Complaint  Patient presents with   Plantar Fasciitis    left foot pain, heel is much better - now having some pain in the top of her foot    57 y.o. female presents with the above complaint. History confirmed with patient.  Heel pain is doing better she now has pain and burning on the top of the foot towards the end of the day she wear safety shoes for work  Objective:  Physical Exam: warm, good capillary refill, no trophic changes or ulcerative lesions, normal DP and PT pulses, normal sensory exam, and palpable dorsal spurring pain on percussion to the deep peroneal nerve bilaterally Assessment:   1. Neuritis of foot, unspecified laterality   2. Pain in both feet   3. Exostosis of bone of foot      Plan:  Patient was evaluated and treated and all questions answered.  I discussed with her with her palpable dorsal spurring she is likely getting dorsal peripheral neuritis compressed between the bone spur and shoe gear.  Discussed alternate lacing patterns as well as use of a topical anti-inflammatory gel such as diclofenac.  Refill of this as well as methylprednisolone taper for relief was sent to pharmacy.  She will return as needed if it does not improve or worsens  Return if symptoms worsen or fail to improve.

## 2023-01-05 ENCOUNTER — Encounter: Payer: Self-pay | Admitting: Internal Medicine

## 2023-01-05 ENCOUNTER — Ambulatory Visit: Payer: BLUE CROSS/BLUE SHIELD | Attending: Internal Medicine | Admitting: Internal Medicine

## 2023-01-05 VITALS — BP 142/80 | HR 75 | Temp 97.6°F | Ht 60.0 in | Wt 127.0 lb

## 2023-01-05 DIAGNOSIS — Z1211 Encounter for screening for malignant neoplasm of colon: Secondary | ICD-10-CM

## 2023-01-05 DIAGNOSIS — Z2821 Immunization not carried out because of patient refusal: Secondary | ICD-10-CM

## 2023-01-05 DIAGNOSIS — I10 Essential (primary) hypertension: Secondary | ICD-10-CM | POA: Diagnosis not present

## 2023-01-05 DIAGNOSIS — E782 Mixed hyperlipidemia: Secondary | ICD-10-CM | POA: Diagnosis not present

## 2023-01-05 DIAGNOSIS — Z1231 Encounter for screening mammogram for malignant neoplasm of breast: Secondary | ICD-10-CM

## 2023-01-05 MED ORDER — LISINOPRIL 20 MG PO TABS: 20.0000 mg | ORAL_TABLET | Freq: Every day | ORAL | 1 refills | Status: DC

## 2023-01-05 MED ORDER — ROSUVASTATIN CALCIUM 20 MG PO TABS: 20.0000 mg | ORAL_TABLET | Freq: Every day | ORAL | 1 refills | Status: DC

## 2023-01-05 MED ORDER — POTASSIUM CHLORIDE ER 10 MEQ PO TBCR: EXTENDED_RELEASE_TABLET | ORAL | 1 refills | Status: DC

## 2023-01-05 MED ORDER — HYDROCHLOROTHIAZIDE 25 MG PO TABS: 25.0000 mg | ORAL_TABLET | Freq: Every day | ORAL | 1 refills | Status: DC

## 2023-01-05 NOTE — Progress Notes (Signed)
Patient ID: Crystal Whitaker, female    DOB: [birth year]  MRN: [id number]  CC: Hypertension (HTN f/u. Med refills. /No to flu vax.)   Subjective: Crystal Whitaker is a 57 y.o. female who presents for chronic ds management.  She speaks english Her concerns today include:  Patient with history of HTN, lower back pain syndrome, pre-DM (last A1C 08/2020 5.4), HL   HTN: HTN: Reports compliance with taking hydrochlorothiazide 25 mg daily and lisinopril 20 mg daily and potassium supplement.  Out of meds x 2 days Checks BP 2x/wk.  Gives ranges in 140s/80-85 Tries to limit salt No CP/SOB/LE edema Taking and tolerating Crestor I went over lab results from last visit and CMA was unable to reach her via phone.  Her LDL cholesterol was 173.  Kidney and liver function tests were good.  HM:  referred for MMG on last visit.  Has transportation issues, depends on daughter.  Declines flu and shingles vaccine.  Reports she mailed FIT test.  We never received it Patient Active Problem List   Diagnosis Date Noted   Elevated blood pressure reading in office with diagnosis of hypertension 08/06/2021   Mixed hyperlipidemia 08/06/2021   Headache(784.0) 02/22/2014   Knee pain, acute 04/21/2013   Essential hypertension 12/15/2008   DEGENERATIVE JOINT DISEASE, SHOULDER 12/15/2008   LOW BACK PAIN SYNDROME 08/02/2007     Current Outpatient Medications on File Prior to Visit  Medication Sig Dispense Refill   acetaminophen (TYLENOL) 500 MG tablet Take 2 tablets (1,000 mg total) by mouth every 6 (six) hours as needed for moderate pain. 30 tablet 0   diclofenac Sodium (VOLTAREN) 1 % GEL Apply 2 g topically 4 (four) times daily. 100 g 1   meloxicam (MOBIC) 15 MG tablet Take 1 tablet (15 mg total) by mouth daily. 30 tablet 3   methylPREDNISolone (MEDROL DOSEPAK) 4 MG TBPK tablet FOLLOW PACKAGE DIRECTIONS 21 each 0   triamcinolone cream (KENALOG) 0.1 % Apply 1 application topically 2 (two) times daily. To area of  skin rash 30 g 0   No current facility-administered medications on file prior to visit.    No Known Allergies  Social History   Socioeconomic History   Marital status: Married    Spouse name: Not on file   Number of children: Not on file   Years of education: Not on file   Highest education level: Not on file  Occupational History   Not on file  Tobacco Use   Smoking status: Never   Smokeless tobacco: Never  Substance and Sexual Activity   Alcohol use: No   Drug use: No   Sexual activity: Not Currently  Other Topics Concern   Not on file  Social History Narrative   Not on file   Social Determinants of Health   Financial Resource Strain: Not on file  Food Insecurity: Not on file  Transportation Needs: Not on file  Physical Activity: Not on file  Stress: Not on file  Social Connections: Not on file  Intimate Partner Violence: Not on file    Family History  Problem Relation Age of Onset   Hypertension Mother    Hypertension Sister    Hypertension Brother     Past Surgical History:  Procedure Laterality Date   CESAREAN SECTION      ROS: Review of Systems Negative except as stated above  PHYSICAL EXAM: BP (!) 142/80   Pulse 75   Temp 97.6 F (36.4 C) (Oral)   Ht 5' (  1.524 m)   Wt 127 lb (57.6 kg)   LMP 06/04/2011   SpO2 98%   BMI 24.80 kg/m   Physical Exam  General appearance - alert, well appearing, older female and in no distress Mental status - normal mood, behavior, speech, dress, motor activity, and thought processes Neck - supple, no significant adenopathy Chest - clear to auscultation, no wheezes, rales or rhonchi, symmetric air entry Heart - normal rate, regular rhythm, normal S1, S2, no murmurs, rubs, clicks or gallops Extremities - peripheral pulses normal, no pedal edema, no clubbing or cyanosis      Latest Ref Rng & Units 09/05/2022    4:41 PM 02/25/2021    3:17 PM 08/24/2020   10:43 AM  CMP  Glucose 70 - 99 mg/dL 102  87  92    BUN 6 - 24 mg/dL 16  14  12    Creatinine 0.57 - 1.00 mg/dL 0.67  0.65  0.66   Sodium 134 - 144 mmol/L 139  141  140   Potassium 3.5 - 5.2 mmol/L 3.7  3.9  3.3   Chloride 96 - 106 mmol/L 98  102  102   CO2 20 - 29 mmol/L 29  25  28    Calcium 8.7 - 10.2 mg/dL 9.7  9.6  9.5   Total Protein 6.0 - 8.5 g/dL 6.8  6.8  6.7   Total Bilirubin 0.0 - 1.2 mg/dL 0.2  0.4  0.4   Alkaline Phos 44 - 121 IU/L 64  64  67   AST 0 - 40 IU/L 16  15  14    ALT 0 - 32 IU/L 15  12  9     Lipid Panel     Component Value Date/Time   CHOL 253 (H) 09/05/2022 1641   TRIG 147 09/05/2022 1641   HDL 53 09/05/2022 1641   CHOLHDL 4.8 (H) 09/05/2022 1641   CHOLHDL 4.0 10/23/2016 0954   VLDL 29 10/23/2016 0954   LDLCALC 173 (H) 09/05/2022 1641    CBC    Component Value Date/Time   WBC 4.2 09/05/2022 1641   WBC 14.4 (H) 02/28/2014 0848   RBC 4.37 09/05/2022 1641   RBC 4.29 02/28/2014 0848   HGB 11.8 09/05/2022 1641   HCT 36.3 09/05/2022 1641   PLT 224 09/05/2022 1641   MCV 83 09/05/2022 1641   MCH 27.0 09/05/2022 1641   MCH 27.0 02/28/2014 0848   MCHC 32.5 09/05/2022 1641   MCHC 33.0 02/28/2014 0848   RDW 13.5 09/05/2022 1641   LYMPHSABS 0.8 02/28/2014 0848   MONOABS 0.8 02/28/2014 0848   EOSABS 0.0 02/28/2014 0848   BASOSABS 0.0 02/28/2014 0848    ASSESSMENT AND PLAN: 1. Essential hypertension Not at goal but patient has been out of her medications hydrochlorothiazide and lisinopril for 2 days.  Refill sent to her pharmacy. - hydrochlorothiazide (HYDRODIURIL) 25 MG tablet; Take 1 tablet (25 mg total) by mouth daily.  Dispense: 90 tablet; Refill: 1 - lisinopril (ZESTRIL) 20 MG tablet; Take 1 tablet (20 mg total) by mouth daily. To lower blood pressure  Dispense: 90 tablet; Refill: 1 - potassium chloride (KLOR-CON) 10 MEQ tablet; TAKE 2 TABLETS(20 MEQ) BY MOUTH DAILY  Dispense: 180 tablet; Refill: 1  2. Mixed hyperlipidemia LDL was not at goal on last visit.  Patient reports she has always been  taking her medication consistently.  She is agreeable to increasing the dose of the rosuvastatin to 20 mg daily today - rosuvastatin (CRESTOR) 20 MG tablet;  Take 1 tablet (20 mg total) by mouth daily. To lower cholesterol  Dispense: 90 tablet; Refill: 1  3. Screening for colon cancer Informed her that we never received the fit test.  We will give her a new kit today.  Advised to try to use it today before she leaves.  If not advised to bring it in to the lab the same day that she uss it - Fecal occult blood, imunochemical([facility])  4. Encounter for screening mammogram for malignant neoplasm of breast - MM Digital Screening; Future  5. Influenza vaccination declined    Patient was given the opportunity to ask questions.  Patient verbalized understanding of the plan and was able to repeat key elements of the plan.   This documentation was completed using Radio producer.  Any transcriptional errors are unintentional.  Orders Placed This Encounter  Procedures   Fecal occult blood, imunochemical([facility])   MM Digital Screening     Requested Prescriptions   Signed Prescriptions Disp Refills   hydrochlorothiazide (HYDRODIURIL) 25 MG tablet 90 tablet 1    Sig: Take 1 tablet (25 mg total) by mouth daily.   lisinopril (ZESTRIL) 20 MG tablet 90 tablet 1    Sig: Take 1 tablet (20 mg total) by mouth daily. To lower blood pressure   potassium chloride (KLOR-CON) 10 MEQ tablet 180 tablet 1    Sig: TAKE 2 TABLETS(20 MEQ) BY MOUTH DAILY   rosuvastatin (CRESTOR) 20 MG tablet 90 tablet 1    Sig: Take 1 tablet (20 mg total) by mouth daily. To lower cholesterol    Return in about 2 months (around 03/07/2023) for PAP.  Karle Plumber, MD, FACP

## 2023-03-05 ENCOUNTER — Telehealth: Payer: Self-pay

## 2023-03-10 ENCOUNTER — Ambulatory Visit: Payer: BLUE CROSS/BLUE SHIELD | Admitting: Internal Medicine

## 2023-03-24 ENCOUNTER — Ambulatory Visit: Payer: BLUE CROSS/BLUE SHIELD | Attending: Nurse Practitioner | Admitting: Nurse Practitioner

## 2023-03-24 ENCOUNTER — Encounter: Payer: Self-pay | Admitting: Nurse Practitioner

## 2023-03-24 VITALS — BP 137/68 | HR 60 | Ht 60.0 in | Wt 127.6 lb

## 2023-03-24 DIAGNOSIS — Z1211 Encounter for screening for malignant neoplasm of colon: Secondary | ICD-10-CM | POA: Diagnosis not present

## 2023-03-24 DIAGNOSIS — I10 Essential (primary) hypertension: Secondary | ICD-10-CM | POA: Diagnosis not present

## 2023-03-24 DIAGNOSIS — R143 Flatulence: Secondary | ICD-10-CM

## 2023-03-24 MED ORDER — LISINOPRIL 20 MG PO TABS: 20.0000 mg | ORAL_TABLET | Freq: Every day | ORAL | 1 refills | Status: DC

## 2023-03-24 MED ORDER — SIMETHICONE 80 MG PO CHEW: 80.0000 mg | CHEWABLE_TABLET | Freq: Four times a day (QID) | ORAL | 3 refills | Status: AC | PRN

## 2023-03-24 MED ORDER — HYDROCHLOROTHIAZIDE 25 MG PO TABS: 25.0000 mg | ORAL_TABLET | Freq: Every day | ORAL | 1 refills | Status: DC

## 2023-03-24 NOTE — Progress Notes (Signed)
Assessment & Plan:  Crystal Whitaker was seen today for hypertension.  Diagnoses and all orders for this visit:  Primary hypertension -     lisinopril (ZESTRIL) 20 MG tablet; Take 1 tablet (20 mg total) by mouth daily. To lower blood pressure -     hydrochlorothiazide (HYDRODIURIL) 25 MG tablet; Take 1 tablet (25 mg total) by mouth daily. Continue all antihypertensives as prescribed.  Reminded to bring in blood pressure log for follow  up appointment.  RECOMMENDATIONS: DASH/Mediterranean Diets are healthier choices for HTN.    Colon cancer screening -     Ambulatory referral to Gastroenterology  [place] -     simethicone (GAS-X) 80 MG chewable tablet; Chew 1 tablet (80 mg total) by mouth every 6 (six) hours as needed for flatulence.    Patient has been counseled on age-appropriate routine health concerns for screening and prevention. These are reviewed and up-to-date. Referrals have been placed accordingly. Immunizations are up-to-date or declined.    Subjective:   Chief Complaint  Patient presents with   Hypertension    Crystal Whitaker 57 y.o. female presents to office today for HTN She is a patient of Dr. Henriette Combs.  VRI was used to communicate directly with patient for the entire encounter including providing detailed patient instructions.    She has a PMH of low back pain, plantar fasciitis and HTN   Blood pressure is well controlled.  She is taking lisinopril 20 mg daily and hydrochlorothiazide 25 mg daily as prescribed. BP Readings from Last 3 Encounters:  03/24/23 137/68  01/05/23 (!) 142/80  09/05/22 132/73    Notes increased bloating and gas/flatus over the past several days.  Has not changed her dietary habits or started any recent new medications.  Denies any nausea, vomiting melena or hematochezia.  Will try simethicone today.  She was also referred to GI for colonoscopy as she is overdue.   Review of Systems  Constitutional:  Negative for fever, malaise/fatigue and  weight loss.  HENT: Negative.  Negative for nosebleeds.   Eyes: Negative.  Negative for blurred vision, double vision and photophobia.  Respiratory: Negative.  Negative for cough and shortness of breath.   Cardiovascular: Negative.  Negative for chest pain, palpitations and leg swelling.  Gastrointestinal: Negative.  Negative for abdominal pain, blood in stool, constipation, diarrhea, heartburn, melena, nausea and vomiting.       See HPI  Musculoskeletal: Negative.  Negative for myalgias.  Neurological: Negative.  Negative for dizziness, focal weakness, seizures and headaches.  Psychiatric/Behavioral: Negative.  Negative for suicidal ideas.     Past Medical History:  Diagnosis Date   Back pain    Hypertension    Sinusitis     Past Surgical History:  Procedure Laterality Date   CESAREAN SECTION      Family History  Problem Relation Age of Onset   Hypertension Mother    Hypertension Sister    Hypertension Brother     Social History Reviewed with no changes to be made today.   Outpatient Medications Prior to Visit  Medication Sig Dispense Refill   acetaminophen (TYLENOL) 500 MG tablet Take 2 tablets (1,000 mg total) by mouth every 6 (six) hours as needed for moderate pain. 30 tablet 0   diclofenac Sodium (VOLTAREN) 1 % GEL Apply 2 g topically 4 (four) times daily. 100 g 1   meloxicam (MOBIC) 15 MG tablet Take 1 tablet (15 mg total) by mouth daily. 30 tablet 3   methylPREDNISolone (MEDROL DOSEPAK) 4 MG  TBPK tablet FOLLOW PACKAGE DIRECTIONS 21 each 0   potassium chloride (KLOR-CON) 10 MEQ tablet TAKE 2 TABLETS(20 MEQ) BY MOUTH DAILY 180 tablet 1   rosuvastatin (CRESTOR) 20 MG tablet Take 1 tablet (20 mg total) by mouth daily. To lower cholesterol 90 tablet 1   triamcinolone cream (KENALOG) 0.1 % Apply 1 application topically 2 (two) times daily. To area of skin rash 30 g 0   hydrochlorothiazide (HYDRODIURIL) 25 MG tablet Take 1 tablet (25 mg total) by mouth daily. 90 tablet 1    lisinopril (ZESTRIL) 20 MG tablet Take 1 tablet (20 mg total) by mouth daily. To lower blood pressure 90 tablet 1   No facility-administered medications prior to visit.    No Known Allergies     Objective:    BP 137/68 (BP Location: Left Arm, Patient Position: Sitting, Cuff Size: Normal)   Pulse 60   Ht 5' (1.524 m)   Wt 127 lb 9.6 oz (57.9 kg)   LMP 06/04/2011   SpO2 97%   BMI 24.92 kg/m  Wt Readings from Last 3 Encounters:  03/24/23 127 lb 9.6 oz (57.9 kg)  01/05/23 127 lb (57.6 kg)  11/07/21 126 lb 9.6 oz (57.4 kg)    Physical Exam Vitals and nursing note reviewed.  Constitutional:      Appearance: She is well-developed.  HENT:     Head: Normocephalic and atraumatic.  Cardiovascular:     Rate and Rhythm: Normal rate and regular rhythm.     Heart sounds: Normal heart sounds. No murmur heard.    No friction rub. No gallop.  Pulmonary:     Effort: Pulmonary effort is normal. No tachypnea or respiratory distress.     Breath sounds: Normal breath sounds. No decreased breath sounds, wheezing, rhonchi or rales.  Chest:     Chest wall: No tenderness.  Abdominal:     General: Bowel sounds are normal.     Palpations: Abdomen is soft.  Musculoskeletal:        General: Normal range of motion.     Cervical back: Normal range of motion.  Skin:    General: Skin is warm and dry.  Neurological:     Mental Status: She is alert and oriented to person, place, and time.     Coordination: Coordination normal.  Psychiatric:        Behavior: Behavior normal. Behavior is cooperative.        Thought Content: Thought content normal.        Judgment: Judgment normal.          Patient has been counseled extensively about nutrition and exercise as well as the importance of adherence with medications and regular follow-up. The patient was given clear instructions to go to ER or return to medical center if symptoms don't improve, worsen or new problems develop. The patient verbalized  understanding.   Follow-up: Return in about 3 months (around 06/24/2023) for PAP SMEAR.   Claiborne Rigg, FNP-BC [facility] [place], [place] [phone]   03/24/2023, 5:00 PM

## 2023-06-25 ENCOUNTER — Ambulatory Visit: Payer: BLUE CROSS/BLUE SHIELD | Admitting: Internal Medicine

## 2023-06-27 ENCOUNTER — Other Ambulatory Visit: Payer: Self-pay | Admitting: Internal Medicine

## 2023-06-27 DIAGNOSIS — E782 Mixed hyperlipidemia: Secondary | ICD-10-CM

## 2023-06-29 ENCOUNTER — Other Ambulatory Visit: Payer: Self-pay | Admitting: Internal Medicine

## 2023-06-29 DIAGNOSIS — I10 Essential (primary) hypertension: Secondary | ICD-10-CM

## 2023-06-29 NOTE — Telephone Encounter (Signed)
Will forward to PCP 

## 2023-07-31 ENCOUNTER — Telehealth: Payer: Self-pay | Admitting: Internal Medicine

## 2023-07-31 NOTE — Telephone Encounter (Signed)
Disregard, patient does not know medication names

## 2023-09-12 ENCOUNTER — Other Ambulatory Visit: Payer: Self-pay

## 2023-09-12 ENCOUNTER — Ambulatory Visit (HOSPITAL_COMMUNITY)
Admission: EM | Admit: 2023-09-12 | Discharge: 2023-09-12 | Disposition: A | Discharge status: Home / Self Care | Payer: BLUE CROSS/BLUE SHIELD | Attending: Family Medicine | Admitting: Family Medicine

## 2023-09-12 ENCOUNTER — Encounter (HOSPITAL_COMMUNITY): Payer: Self-pay | Admitting: *Deleted

## 2023-09-12 DIAGNOSIS — H1031 Unspecified acute conjunctivitis, right eye: Secondary | ICD-10-CM | POA: Diagnosis not present

## 2023-09-12 MED ORDER — OFLOXACIN 0.3 % OP SOLN: 1.0000 [drp] | Freq: Four times a day (QID) | OPHTHALMIC | 0 refills | Status: AC

## 2023-09-12 NOTE — ED Triage Notes (Signed)
Pt reports RT eye has been red for 5 days.

## 2023-09-12 NOTE — ED Provider Notes (Signed)
[facility]    CSN: [id number] Arrival date & time: 09/12/23  1158      History   Chief Complaint Chief Complaint  Patient presents with   Eye Problem    HPI Crystal Whitaker is a 57 y.o. female.   The history is provided by the patient. No language interpreter was used.  Eye Problem Location:  Right eye Quality:  Burning (Itchy red eye with some pain) Duration:  5 days Timing:  Constant Progression:  Worsening Chronicity:  New Context: not direct trauma   Relieved by:  Nothing Worsened by:  Nothing Ineffective treatments: OTC Pataday. Associated symptoms: discharge, itching, redness, swelling and tearing   Associated symptoms: no blurred vision and no decreased vision     Past Medical History:  Diagnosis Date   Back pain    Hypertension    Sinusitis     Patient Active Problem List   Diagnosis Date Noted   Elevated blood pressure reading in office with diagnosis of hypertension 08/06/2021   Mixed hyperlipidemia 08/06/2021   Headache 02/22/2014   Knee pain, acute 04/21/2013   Essential hypertension 12/15/2008   Osteoarthritis of shoulder 12/15/2008   LOW BACK PAIN SYNDROME 08/02/2007    Past Surgical History:  Procedure Laterality Date   CESAREAN SECTION      OB History   No obstetric history on file.      Home Medications    Prior to Admission medications   Medication Sig Start Date End Date Taking? Authorizing Provider  hydrochlorothiazide (HYDRODIURIL) 25 MG tablet Take 1 tablet (25 mg total) by mouth daily. 03/24/23  Yes Claiborne Rigg, NP  ofloxacin (OCUFLOX) 0.3 % ophthalmic solution Place 1 drop into the right eye 4 (four) times daily for 5 days. 09/12/23 09/17/23 Yes Doreene Eland, MD  potassium chloride (KLOR-CON) 10 MEQ tablet TAKE 2 TABLETS(20 MEQ) BY MOUTH DAILY 06/30/23  Yes Marcine Matar, MD  rosuvastatin (CRESTOR) 20 MG tablet TAKE 1 TABLET(20 MG) BY MOUTH DAILY FOR CHOLESTEROL 06/29/23  Yes Hoy Register, MD   simethicone (GAS-X) 80 MG chewable tablet Chew 1 tablet (80 mg total) by mouth every 6 (six) hours as needed for flatulence. 03/24/23  Yes Claiborne Rigg, NP  acetaminophen (TYLENOL) 500 MG tablet Take 2 tablets (1,000 mg total) by mouth every 6 (six) hours as needed for moderate pain. 01/12/17   Lizbeth Bark, FNP  diclofenac Sodium (VOLTAREN) 1 % GEL Apply 2 g topically 4 (four) times daily. 10/28/22   McDonald, Rachelle Hora, DPM  lisinopril (ZESTRIL) 20 MG tablet Take 1 tablet (20 mg total) by mouth daily. To lower blood pressure 03/24/23   Claiborne Rigg, NP  meloxicam (MOBIC) 15 MG tablet Take 1 tablet (15 mg total) by mouth daily. 01/28/22   Edwin Cap, DPM  methylPREDNISolone (MEDROL DOSEPAK) 4 MG TBPK tablet FOLLOW PACKAGE DIRECTIONS 10/28/22   Edwin Cap, DPM  triamcinolone cream (KENALOG) 0.1 % Apply 1 application topically 2 (two) times daily. To area of skin rash 08/06/18   Cain Saupe, MD    Family History Family History  Problem Relation Age of Onset   Hypertension Mother    Hypertension Sister    Hypertension Brother     Social History Social History   Tobacco Use   Smoking status: Never   Smokeless tobacco: Never  Vaping Use   Vaping status: Never Used  Substance Use Topics   Alcohol use: No   Drug use: No  Allergies   Patient has no known allergies.   Review of Systems Review of Systems  Eyes:  Positive for discharge, redness and itching. Negative for blurred vision.  All other systems reviewed and are negative.    Physical Exam Triage Vital Signs ED Triage Vitals  Encounter Vitals Group     BP 09/12/23 1301 138/80     Systolic BP Percentile --      Diastolic BP Percentile --      Pulse Rate 09/12/23 1301 69     Resp 09/12/23 1301 18     Temp 09/12/23 1301 98.7 F (37.1 C)     Temp src --      SpO2 09/12/23 1301 97 %     Weight --      Height --      Head Circumference --      Peak Flow --      Pain Score 09/12/23 1259 8      Pain Loc --      Pain Education --      Exclude from Growth Chart --    No data found.  Updated Vital Signs BP 138/80   Pulse 69   Temp 98.7 F (37.1 C)   Resp 18   LMP 06/04/2011   SpO2 97%   Visual Acuity Right Eye Distance:   Left Eye Distance:   Bilateral Distance:    Right Eye Near:   Left Eye Near:    Bilateral Near:     Physical Exam Vitals and nursing note reviewed.  Constitutional:      Appearance: She is not ill-appearing.  Eyes:     Extraocular Movements: Extraocular movements intact.     Pupils: Pupils are equal, round, and reactive to light.     Comments: Moderate right eye conjunctivitis, mostly affecting her medial conjunctiva (atypical presentation)  Neurological:     Mental Status: She is alert.      UC Treatments / Results  Labs (all labs ordered are listed, but only abnormal results are displayed) Labs Reviewed - No data to display  EKG   Radiology No results found.  Procedures Procedures (including critical care time)  Medications Ordered in UC Medications - No data to display  Initial Impression / Assessment and Plan / UC Course  I have reviewed the triage vital signs and the nursing notes.  Pertinent labs & imaging results that were available during my care of the patient were reviewed by me and considered in my medical decision making (see chart for details).  Clinical Course as of 09/12/23 1333  Sat Sep 12, 2023  1331 Conjunctivitis - ?? Bacterial vs allergy due to itchiness nature with mild pain I conducted visual acuity by finger counting, and it was normal. Atypical presentation. She tried Pataday with no improvement. Trial of topical A/B plus prn Pataday Return precaution discussed [KE]    Clinical Course User Index [KE] Doreene Eland, MD     Final Clinical Impressions(s) / UC Diagnoses   Final diagnoses:  Acute conjunctivitis of right eye, unspecified acute conjunctivitis type     Discharge Instructions       It's nice meeting you. I am sorry about your conjunctivitis. Continue Pataday as needed for itching. I will add on topical antibiotic. Please see your PCP soon if there is no improvement.      ED Prescriptions     Medication Sig Dispense Auth. Provider   ofloxacin (OCUFLOX) 0.3 % ophthalmic solution Place 1 drop  into the right eye 4 (four) times daily for 5 days. 5 mL Doreene Eland, MD      PDMP not reviewed this encounter.   Doreene Eland, MD 09/12/23 [phone]

## 2023-09-12 NOTE — Discharge Instructions (Signed)
It's nice meeting you. I am sorry about your conjunctivitis. Continue Pataday as needed for itching. I will add on topical antibiotic. Please see your PCP soon if there is no improvement.

## 2023-09-27 ENCOUNTER — Other Ambulatory Visit: Payer: Self-pay | Admitting: Nurse Practitioner

## 2023-09-27 DIAGNOSIS — I10 Essential (primary) hypertension: Secondary | ICD-10-CM

## 2023-10-20 ENCOUNTER — Ambulatory Visit (HOSPITAL_COMMUNITY)
Admission: EM | Admit: 2023-10-20 | Discharge: 2023-10-20 | Disposition: A | Discharge status: Home / Self Care | Payer: BLUE CROSS/BLUE SHIELD | Attending: Internal Medicine | Admitting: Internal Medicine

## 2023-10-20 ENCOUNTER — Encounter (HOSPITAL_COMMUNITY): Payer: Self-pay

## 2023-10-20 DIAGNOSIS — K047 Periapical abscess without sinus: Secondary | ICD-10-CM | POA: Diagnosis not present

## 2023-10-20 MED ORDER — PENICILLIN V POTASSIUM 500 MG PO TABS: 500.0000 mg | ORAL_TABLET | Freq: Four times a day (QID) | ORAL | 0 refills | Status: AC

## 2023-10-20 NOTE — ED Triage Notes (Signed)
Patient presents to office for dental pain x 3 days. Patient has been taking Motrin for pain.

## 2023-10-20 NOTE — ED Provider Notes (Signed)
 [facility]    CSN: [id number] Arrival date & time: 10/20/23  1010      History   Chief Complaint Chief Complaint  Patient presents with   Dental Pain    HPI Crystal Whitaker is a 57 y.o. female who presents with dental pain on R last molar x 3 days, has been taking Motrin . Does not have a dentist. Has been having pain on her R face and HA.     Past Medical History:  Diagnosis Date   Back pain    Hypertension    Sinusitis     Patient Active Problem List   Diagnosis Date Noted   Elevated blood pressure reading in office with diagnosis of hypertension 08/06/2021   Mixed hyperlipidemia 08/06/2021   Headache 02/22/2014   Knee pain, acute 04/21/2013   Essential hypertension 12/15/2008   Osteoarthritis of shoulder 12/15/2008   LOW BACK PAIN SYNDROME 08/02/2007    Past Surgical History:  Procedure Laterality Date   CESAREAN SECTION      OB History   No obstetric history on file.      Home Medications    Prior to Admission medications   Medication Sig Start Date End Date Taking? Authorizing Provider  penicillin  v potassium (VEETID) 500 MG tablet Take 1 tablet (500 mg total) by mouth 4 (four) times daily for 10 days. 10/20/23 10/30/23 Yes Rodriguez-Southworth, Zaylynn Rickett, PA-C  acetaminophen  (TYLENOL ) 500 MG tablet Take 2 tablets (1,000 mg total) by mouth every 6 (six) hours as needed for moderate pain. 01/12/17   Durenda Alston SAUNDERS, FNP  diclofenac  Sodium (VOLTAREN ) 1 % GEL Apply 2 g topically 4 (four) times daily. 10/28/22   Silva Juliene SAUNDERS, DPM  hydrochlorothiazide  (HYDRODIURIL ) 25 MG tablet TAKE 1 TABLET(25 MG) BY MOUTH DAILY 09/29/23   Vicci Barnie NOVAK, MD  lisinopril  (ZESTRIL ) 20 MG tablet TAKE 1 TABLET(20 MG) BY MOUTH DAILY FOR BLOOD PRESSURE 09/29/23   Vicci Barnie NOVAK, MD  meloxicam  (MOBIC ) 15 MG tablet Take 1 tablet (15 mg total) by mouth daily. 01/28/22   Silva Juliene SAUNDERS, DPM  methylPREDNISolone  (MEDROL  DOSEPAK) 4 MG TBPK tablet FOLLOW PACKAGE  DIRECTIONS 10/28/22   McDonald, Adam R, DPM  potassium chloride  (KLOR-CON ) 10 MEQ tablet TAKE 2 TABLETS(20 MEQ) BY MOUTH DAILY 06/30/23   Vicci Barnie NOVAK, MD  rosuvastatin  (CRESTOR ) 20 MG tablet TAKE 1 TABLET(20 MG) BY MOUTH DAILY FOR CHOLESTEROL 06/29/23   Newlin, Enobong, MD  simethicone  (GAS-X) 80 MG chewable tablet Chew 1 tablet (80 mg total) by mouth every 6 (six) hours as needed for flatulence. 03/24/23   Fleming, Zelda W, NP  triamcinolone  cream (KENALOG ) 0.1 % Apply 1 application topically 2 (two) times daily. To area of skin rash 08/06/18   Alec House, MD    Family History Family History  Problem Relation Age of Onset   Hypertension Mother    Hypertension Sister    Hypertension Brother     Social History Social History   Tobacco Use   Smoking status: Never   Smokeless tobacco: Never  Vaping Use   Vaping status: Never Used  Substance Use Topics   Alcohol use: No   Drug use: No     Allergies   Patient has no known allergies.   Review of Systems Review of Systems  As noted in HPI Physical Exam Triage Vital Signs ED Triage Vitals  Encounter Vitals Group     BP 10/20/23 1120 (!) 163/87     Systolic BP Percentile --  Diastolic BP Percentile --      Pulse Rate 10/20/23 1120 71     Resp 10/20/23 1120 18     Temp 10/20/23 1120 98.2 F (36.8 C)     Temp Source 10/20/23 1120 Oral     SpO2 10/20/23 1120 98 %     Weight --      Height --      Head Circumference --      Peak Flow --      Pain Score 10/20/23 1123 6     Pain Loc --      Pain Education --      Exclude from Growth Chart --    No data found.  Updated Vital Signs BP (!) 163/87 (BP Location: Left Arm)   Pulse 71   Temp 98.2 F (36.8 C) (Oral)   Resp 18   LMP 06/04/2011   SpO2 98%   Visual Acuity Right Eye Distance:   Left Eye Distance:   Bilateral Distance:    Right Eye Near:   Left Eye Near:    Bilateral Near:     Physical Exam Vitals and nursing note reviewed.  Constitutional:       General: She is not in acute distress.    Appearance: She is obese. She is not toxic-appearing.  HENT:     Head:     Comments: R face is a little swollen    Right Ear: External ear normal.     Left Ear: External ear normal.     Mouth/Throat:      Comments: Carious and tender to tappuing Eyes:     General: No scleral icterus.    Conjunctiva/sclera: Conjunctivae normal.  Pulmonary:     Effort: Pulmonary effort is normal.  Lymphadenopathy:     Cervical: Cervical adenopathy present.  Skin:    General: Skin is warm and dry.  Neurological:     Mental Status: She is alert and oriented to person, place, and time.     Gait: Gait normal.  Psychiatric:        Mood and Affect: Mood normal.        Behavior: Behavior normal.        Thought Content: Thought content normal.        Judgment: Judgment normal.      UC Treatments / Results  Labs (all labs ordered are listed, but only abnormal results are displayed) Labs Reviewed - No data to display  EKG   Radiology No results found.  Procedures Procedures (including critical care time)  Medications Ordered in UC Medications - No data to display  Initial Impression / Assessment and Plan / UC Course  I have reviewed the triage vital signs and the nursing notes.  Dental infection and pain  I placed her on Penicillin  as noted I gave her a list she can see in in town and told her to call to make appointment   Final Clinical Impressions(s) / UC Diagnoses   Final diagnoses:  Dental infection   Discharge Instructions   None    ED Prescriptions     Medication Sig Dispense Auth. Provider   penicillin  v potassium (VEETID) 500 MG tablet Take 1 tablet (500 mg total) by mouth 4 (four) times daily for 10 days. 40 tablet Rodriguez-Southworth, Kyra, PA-C      PDMP not reviewed this encounter.   Lindi Kyra, PA-C 10/20/23 1144

## 2023-11-16 ENCOUNTER — Encounter: Payer: Self-pay | Admitting: Internal Medicine

## 2023-11-16 ENCOUNTER — Ambulatory Visit: Payer: Self-pay | Attending: Internal Medicine | Admitting: Internal Medicine

## 2023-11-16 VITALS — BP 123/76 | HR 73 | Temp 97.9°F | Ht 60.0 in | Wt 129.0 lb

## 2023-11-16 DIAGNOSIS — I10 Essential (primary) hypertension: Secondary | ICD-10-CM

## 2023-11-16 DIAGNOSIS — Z5941 Food insecurity: Secondary | ICD-10-CM

## 2023-11-16 DIAGNOSIS — R519 Headache, unspecified: Secondary | ICD-10-CM

## 2023-11-16 DIAGNOSIS — E782 Mixed hyperlipidemia: Secondary | ICD-10-CM | POA: Diagnosis not present

## 2023-11-16 DIAGNOSIS — Z1211 Encounter for screening for malignant neoplasm of colon: Secondary | ICD-10-CM

## 2023-11-16 DIAGNOSIS — Z5986 Financial insecurity: Secondary | ICD-10-CM | POA: Diagnosis not present

## 2023-11-16 DIAGNOSIS — Z1231 Encounter for screening mammogram for malignant neoplasm of breast: Secondary | ICD-10-CM

## 2023-11-16 DIAGNOSIS — Z23 Encounter for immunization: Secondary | ICD-10-CM

## 2023-11-16 MED ORDER — POTASSIUM CHLORIDE ER 10 MEQ PO TBCR: EXTENDED_RELEASE_TABLET | ORAL | 1 refills | Status: AC

## 2023-11-16 MED ORDER — HYDROCHLOROTHIAZIDE 25 MG PO TABS: 25.0000 mg | ORAL_TABLET | Freq: Every day | ORAL | 1 refills | Status: DC

## 2023-11-16 MED ORDER — LISINOPRIL 20 MG PO TABS: 20.0000 mg | ORAL_TABLET | Freq: Every day | ORAL | 1 refills | Status: DC

## 2023-11-16 NOTE — Patient Instructions (Signed)
Since you do not have time to wait to get your flu shot and shingles vaccine, you can get them at any outside pharmacy.  Please remember to call us back with the information about your [employer] and [employer] so that we can put this on your chart.

## 2023-11-16 NOTE — Progress Notes (Signed)
Patient ID: Crystal Whitaker, female    DOB: Jan 12, 1966  MRN: [id number]  CC: Gynecologic Exam (Pap. Med refills. /Intermittent headaches X 1-2 mo /Yes to flu vax. Yes to mammogram )   Subjective: Crystal Whitaker is a 58 y.o. female who presents for chronic ds management. Her concerns today include:  Patient with history of HTN, lower back pain syndrome, pre-DM (last A1C 08/2020 5.4), HL   [employer] Language interpreter used during this encounter. #Shaza [id number]  This visit was supposed to be for Pap smear.  However patient reports that she does not want to have Pap smear done.  HTN:  on Lisinopril 20 mg and hydrochlorothiazide 25 mg daily and K+ supplement Checks BP once a day and reports good readings. Denies CP/SOB Reports HA sometimes about 2x/mth. Usually RT side. No assoc N/V, photophobia or dizziness.  Can last 2 hrs.  Takes Tylenol with relief in 30 mins.   HA started 1 yr ago.    HL: reports compliance with Crestor  HM: She is agreeable to having the flu vaccine and shingles vaccine but states she does not have time to wait to get it today.  She is due for mammogram and is agreeable to having this ordered.  Also due for colon cancer screening. Patient Active Problem List   Diagnosis Date Noted   Elevated blood pressure reading in office with diagnosis of hypertension 08/06/2021   Mixed hyperlipidemia 08/06/2021   Headache 02/22/2014   Knee pain, acute 04/21/2013   Essential hypertension 12/15/2008   Osteoarthritis of shoulder 12/15/2008   LOW BACK PAIN SYNDROME 08/02/2007     Current Outpatient Medications on File Prior to Visit  Medication Sig Dispense Refill   acetaminophen (TYLENOL) 500 MG tablet Take 2 tablets (1,000 mg total) by mouth every 6 (six) hours as needed for moderate pain. 30 tablet 0   diclofenac Sodium (VOLTAREN) 1 % GEL Apply 2 g topically 4 (four) times daily. 100 g 1   meloxicam (MOBIC) 15 MG tablet Take 1 tablet (15 mg total) by mouth daily. 30 tablet 3    rosuvastatin (CRESTOR) 20 MG tablet TAKE 1 TABLET(20 MG) BY MOUTH DAILY FOR CHOLESTEROL 90 tablet 1   simethicone (GAS-X) 80 MG chewable tablet Chew 1 tablet (80 mg total) by mouth every 6 (six) hours as needed for flatulence. 40 tablet 3   triamcinolone cream (KENALOG) 0.1 % Apply 1 application topically 2 (two) times daily. To area of skin rash 30 g 0   No current facility-administered medications on file prior to visit.    No Known Allergies  Social History   Socioeconomic History   Marital status: Married    Spouse name: Not on file   Number of children: Not on file   Years of education: Not on file   Highest education level: Not on file  Occupational History   Not on file  Tobacco Use   Smoking status: Never   Smokeless tobacco: Never  Vaping Use   Vaping status: Never Used  Substance and Sexual Activity   Alcohol use: No   Drug use: No   Sexual activity: Not Currently  Other Topics Concern   Not on file  Social History Narrative   Not on file   Social Drivers of Health   Financial Resource Strain: High Risk (11/16/2023)   Overall Financial Resource Strain (CARDIA)    Difficulty of Paying Living Expenses: Hard  Food Insecurity: Food Insecurity Present (11/16/2023)   Hunger Vital Sign  Worried About Programme researcher, broadcasting/film/video in the Last Year: Sometimes true    [employer] of Food in the Last Year: Often true  Transportation Needs: No Transportation Needs (11/16/2023)   PRAPARE - Administrator, Civil Service (Medical): No    Lack of Transportation (Non-Medical): No  Physical Activity: Inactive (11/16/2023)   Exercise Vital Sign    Days of Exercise per Week: 0 days    Minutes of Exercise per Session: 0 min  Stress: No Stress Concern Present (11/16/2023)   [employer] of Occupational Health - Occupational Stress Questionnaire    Feeling of Stress : Only a little  Social Connections: Moderately Integrated (11/16/2023)   Social Connection and Isolation  Panel [NHANES]    Frequency of Communication with Friends and Family: Twice a week    Frequency of Social Gatherings with Friends and Family: Once a week    Attends Religious Services: Never    Database administrator or Organizations: No    Attends Engineer, structural: 1 to 4 times per year    Marital Status: Married  Catering manager Violence: Not At Risk (11/16/2023)   Humiliation, Afraid, Rape, and Kick questionnaire    Fear of Current or Ex-Partner: No    Emotionally Abused: No    Physically Abused: No    Sexually Abused: No    Family History  Problem Relation Age of Onset   Hypertension Mother    Hypertension Sister    Hypertension Brother     Past Surgical History:  Procedure Laterality Date   CESAREAN SECTION      ROS: Review of Systems Negative except as stated above  PHYSICAL EXAM: BP 123/76 (BP Location: Left Arm, Patient Position: Sitting, Cuff Size: Normal)   Pulse 73   Temp 97.9 F (36.6 C) (Oral)   Ht 5' (1.524 m)   Wt 129 lb (58.5 kg)   LMP 06/04/2011   SpO2 97%   BMI 25.19 kg/m   Physical Exam  General appearance - alert, well appearing, older female and in no distress Mental status - normal mood, behavior, speech, dress, motor activity, and thought processes Chest - clear to auscultation, no wheezes, rales or rhonchi, symmetric air entry Heart - normal rate, regular rhythm, normal S1, S2, no murmurs, rubs, clicks or gallops Neurological - cranial nerves II through XII intact, motor and sensory grossly normal bilaterally Extremities - peripheral pulses normal, no pedal edema, no clubbing or cyanosis      Latest Ref Rng & Units 09/05/2022    4:41 PM 02/25/2021    3:17 PM 08/24/2020   10:43 AM  CMP  Glucose 70 - 99 mg/dL 130  87  92   BUN 6 - 24 mg/dL 16  14  12    Creatinine 0.57 - 1.00 mg/dL 8.65  7.84  6.96   Sodium 134 - 144 mmol/L 139  141  140   Potassium 3.5 - 5.2 mmol/L 3.7  3.9  3.3   Chloride 96 - 106 mmol/L 98  102  102    CO2 20 - 29 mmol/L 29  25  28    Calcium 8.7 - 10.2 mg/dL 9.7  9.6  9.5   Total Protein 6.0 - 8.5 g/dL 6.8  6.8  6.7   Total Bilirubin 0.0 - 1.2 mg/dL 0.2  0.4  0.4   Alkaline Phos 44 - 121 IU/L 64  64  67   AST 0 - 40 IU/L 16  15  14  ALT 0 - 32 IU/L 15  12  9     Lipid Panel     Component Value Date/Time   CHOL 253 (H) 09/05/2022 1641   TRIG 147 09/05/2022 1641   HDL 53 09/05/2022 1641   CHOLHDL 4.8 (H) 09/05/2022 1641   CHOLHDL 4.0 10/23/2016 0954   VLDL 29 10/23/2016 0954   LDLCALC 173 (H) 09/05/2022 1641    CBC    Component Value Date/Time   WBC 4.2 09/05/2022 1641   WBC 14.4 (H) 02/28/2014 0848   RBC 4.37 09/05/2022 1641   RBC 4.29 02/28/2014 0848   HGB 11.8 09/05/2022 1641   HCT 36.3 09/05/2022 1641   PLT 224 09/05/2022 1641   MCV 83 09/05/2022 1641   MCH 27.0 09/05/2022 1641   MCH 27.0 02/28/2014 0848   MCHC 32.5 09/05/2022 1641   MCHC 33.0 02/28/2014 0848   RDW 13.5 09/05/2022 1641   LYMPHSABS 0.8 02/28/2014 0848   MONOABS 0.8 02/28/2014 0848   EOSABS 0.0 02/28/2014 0848   BASOSABS 0.0 02/28/2014 0848    ASSESSMENT AND PLAN: 1. Essential hypertension (Primary) At goal.  Continue lisinopril and hydrochlorothiazide. - hydrochlorothiazide (HYDRODIURIL) 25 MG tablet; Take 1 tablet (25 mg total) by mouth daily.  Dispense: 90 tablet; Refill: 1 - lisinopril (ZESTRIL) 20 MG tablet; Take 1 tablet (20 mg total) by mouth daily.  Dispense: 90 tablet; Refill: 1 - potassium chloride (KLOR-CON) 10 MEQ tablet; TAKE 2 TABLETS(20 MEQ) BY MOUTH DAILY  Dispense: 180 tablet; Refill: 1 - CBC - Comprehensive metabolic panel  2. Mixed hyperlipidemia Continue Crestor. - Lipid panel  3. New onset of headaches after age 56 Sounds like migraine headaches.  Given her age, I recommend that we get imaging study in the form of the CAT scan.  Patient tells me that she has [employer] and [employer] but does not have her card with her today.  She is afraid that her insurance  may not pay for the CAT scan.  I advised her that her insurance most likely will given the reason that we are ordering it.  Also [facility] will call and get prior approval from her insurance if it is needed.  Patient declined stating that she does not want to have the CAT scan done. -She will continue using Tylenol for abortive therapy since this is effective.  Does not need prophylactic therapy since episodes occur only twice a month.  4. Encounter for screening mammogram for malignant neoplasm of breast Mammogram ordered.  Encouraged her to call us back with her insurance information  5. Need for influenza vaccination 6. Need for shingles vaccine Patient could not wait to get the flu vaccine and shingles vaccine to be given today.  Advised her that she can get it at any outside pharmacy  7. Screening for colon cancer - Fecal occult blood, imunochemical([facility])     Patient was given the opportunity to ask questions.  Patient verbalized understanding of the plan and was able to repeat key elements of the plan.   This documentation was completed using Paediatric nurse.  Any transcriptional errors are unintentional.  Orders Placed This Encounter  Procedures   Fecal occult blood, imunochemical([facility])   MM Digital Screening   CBC   Comprehensive metabolic panel   Lipid panel     Requested Prescriptions   Signed Prescriptions Disp Refills   hydrochlorothiazide (HYDRODIURIL) 25 MG tablet 90 tablet 1    Sig: Take 1 tablet (25 mg total) by  mouth daily.   lisinopril (ZESTRIL) 20 MG tablet 90 tablet 1    Sig: Take 1 tablet (20 mg total) by mouth daily.   potassium chloride (KLOR-CON) 10 MEQ tablet 180 tablet 1    Sig: TAKE 2 TABLETS(20 MEQ) BY MOUTH DAILY    Return in about 4 months (around 03/15/2024).  Jonah Blue, MD, FACP

## 2023-11-17 LAB — COMPREHENSIVE METABOLIC PANEL
ALT: 10 [IU]/L (ref 0–32)
AST: 14 [IU]/L (ref 0–40)
Albumin: 4.3 g/dL (ref 3.8–4.9)
Alkaline Phosphatase: 71 [IU]/L (ref 44–121)
BUN/Creatinine Ratio: 30 — ABNORMAL HIGH (ref 12–28)
BUN: 19 mg/dL (ref 8–27)
Bilirubin Total: 0.3 mg/dL (ref 0.0–1.2)
CO2: 23 mmol/L (ref 20–29)
Calcium: 10.1 mg/dL (ref 8.7–10.3)
Chloride: 100 mmol/L (ref 96–106)
Creatinine, Ser: 0.63 mg/dL (ref 0.57–1.00)
Globulin, Total: 2.6 g/dL (ref 1.5–4.5)
Glucose: 82 mg/dL (ref 70–99)
Potassium: 3.7 mmol/L (ref 3.5–5.2)
Sodium: 139 mmol/L (ref 134–144)
Total Protein: 6.9 g/dL (ref 6.0–8.5)
eGFR: 101 mL/min/{1.73_m2} (ref >59)

## 2023-11-17 LAB — CBC
Hematocrit: 37.8 % (ref 34.0–46.6)
Hemoglobin: 12.4 g/dL (ref 11.1–15.9)
MCH: 27.1 pg (ref 26.6–33.0)
MCHC: 32.8 g/dL (ref 31.5–35.7)
MCV: 83 fL (ref 79–97)
Platelets: 204 10*3/uL (ref 150–450)
RBC: 4.58 x10E6/uL (ref 3.77–5.28)
RDW: 13.8 % (ref 11.7–15.4)
WBC: 3.4 10*3/uL (ref 3.4–10.8)

## 2023-11-17 LAB — LIPID PANEL
Chol/HDL Ratio: 5.2 {ratio} — ABNORMAL HIGH (ref 0.0–4.4)
Cholesterol, Total: 269 mg/dL — ABNORMAL HIGH (ref 100–199)
HDL: 52 mg/dL (ref >39)
LDL Chol Calc (NIH): 173 mg/dL — ABNORMAL HIGH (ref 0–99)
Triglycerides: 236 mg/dL — ABNORMAL HIGH (ref 0–149)
VLDL Cholesterol Cal: 44 mg/dL — ABNORMAL HIGH (ref 5–40)

## 2023-11-18 NOTE — Progress Notes (Signed)
Kidney and liver function tests are normal. Blood cell counts are normal. Cholesterol levels are elevated and not improved compared to a year ago indicating that she may not be taking the Rosuvastatin 20 mg consistently every day for the past 3 months.  Please confirm whether this is the case.  If she has not been taking it consistently every day for the past 3 months, please encourage her to do so.  If she has been taking it consistently, then we need to increase the dose to 40 mg daily.  The rest of this is for my information.  The 10-year ASCVD risk score (Arnett DK, et al., 2019) is: 8.2%   Values used to calculate the score:     Age: 58 years     Sex: Female     Is Non-Hispanic African American: Yes     Diabetic: No     Tobacco smoker: No     Systolic Blood Pressure: 123 mmHg     Is BP treated: Yes     HDL Cholesterol: 52 mg/dL     Total Cholesterol: 269 mg/dL

## 2023-11-19 LAB — FECAL OCCULT BLOOD, IMMUNOCHEMICAL: Fecal Occult Bld: NEGATIVE

## 2024-03-03 ENCOUNTER — Ambulatory Visit (INDEPENDENT_AMBULATORY_CARE_PROVIDER_SITE_OTHER)

## 2024-03-03 ENCOUNTER — Ambulatory Visit (INDEPENDENT_AMBULATORY_CARE_PROVIDER_SITE_OTHER): Payer: Self-pay | Admitting: Podiatry

## 2024-03-03 VITALS — Ht 61.0 in | Wt 129.0 lb

## 2024-03-03 DIAGNOSIS — M778 Other enthesopathies, not elsewhere classified: Secondary | ICD-10-CM

## 2024-03-03 MED ORDER — MELOXICAM 15 MG PO TABS: 15.0000 mg | ORAL_TABLET | Freq: Every day | ORAL | 3 refills | Status: AC

## 2024-03-03 MED ORDER — METHYLPREDNISOLONE 4 MG PO TBPK: ORAL_TABLET | ORAL | 0 refills | Status: DC

## 2024-03-03 NOTE — Patient Instructions (Addendum)
 VISIT SUMMARY: Today, you were seen for recurring foot pain that you have been experiencing. The pain is primarily located on the bottom of your foot and extends from your knee downwards. We discussed your history of a small tear in the tendon and your current activities that may be aggravating your condition. We reviewed your previous treatments and have decided to continue with a similar regimen.  YOUR PLAN: -PERONEAL TENDONITIS: Peroneal tendonitis is an inflammation of the tendons on the outer side of your ankle and foot. We will treat this with a methylprednisolone  taper and meloxicam , and provide you with physical therapy exercises. Healing may take up to two months. Please notify us  if the pain persists or worsens.  -POSSIBLE PERONEAL NERVE ENTRAPMENT: Peroneal nerve entrapment is a condition where the nerve that runs down the leg and into the foot becomes compressed or irritated. If your radiating pain persists or worsens, we may need to refer you to neurosurgery for further evaluation. Please keep us  informed about your symptoms.  INSTRUCTIONS: We have sent your prescriptions for methylprednisolone  and meloxicam  to your preferred pharmacy. Please follow the physical therapy exercises provided. If your pain persists or worsens, notify us  immediately. If the radiating pain continues, we may consider a referral to neurosurgery for further evaluation.    Contains text generated by [employer].    Peroneal Tendinopathy Rehab Ask your health care provider which exercises are safe for you. Do exercises exactly as told by your health care provider and adjust them as directed. It is normal to feel mild stretching, pulling, tightness, or discomfort as you do these exercises. Stop right away if you feel sudden pain or your pain gets worse. Do not begin these exercises until told by your health care provider. Stretching and range-of-motion exercises These exercises warm up your muscles and joints and  improve the movement and flexibility of your ankle. These exercises also help to relieve pain and stiffness. Gastroc and soleus stretch, standing  This is an exercise in which you stand on a step and use your body weight to stretch your calf muscles. To do this exercise: Stand on the edge of a step on the ball of your left / right foot. The ball of your foot is on the walking surface, right under your toes. Keep your other foot firmly on the same step. Hold on to the wall, a railing, or a chair for balance. Slowly lift your other foot, allowing your body weight to press your left / right heel down over the edge of the step. You should feel a stretch in your left / right calf (gastrocnemius and soleus). Hold this position for 15 seconds. Return both feet to the step. Repeat this exercise with a slight bend in your left / right knee. Repeat 5 times with your left / right knee straight and 5 times with your left / right knee bent. Complete this exercise 2 times a day. Strengthening exercises These exercises build strength and endurance in your foot and ankle. Endurance is the ability to use your muscles for a long time, even after they get tired. Ankle dorsiflexion with band   Secure a rubber exercise band or tube to an object, such as a table leg, that will not move when the band is pulled. Secure the other end of the band around your left / right foot. Sit on the floor, facing the object with your left / right leg extended. The band or tube should be slightly tense when your  foot is relaxed. Slowly flex your left / right ankle and toes to bring your foot toward you (dorsiflexion). Hold this position for 15 seconds. Let the band or tube slowly pull your foot back to the starting position. Repeat 5 times. Complete this exercise 2 times a day. Ankle eversion Sit on the floor with your legs straight out in front of you. Loop a rubber exercise band or tube around the ball of your left / right  foot. The ball of your foot is on the walking surface, right under your toes. Hold the ends of the band in your hands, or secure the band to a stable object. The band or tube should be slightly tense when your foot is relaxed. Slowly push your foot outward, away from your other leg (eversion). Hold this position for 15 seconds. Slowly return your foot to the starting position. Repeat 5 times. Complete this exercise 2 times a day. Plantar flexion, standing  This exercise is sometimes called standing heel raise. Stand with your feet shoulder-width apart. Place your hands on a wall or table to steady yourself as needed, but try not to use it for support. Keep your weight spread evenly over the width of your feet while you slowly rise up on your toes (plantar flexion). If told by your health care provider: Shift your weight toward your left / right leg until you feel challenged. Stand on your left / right leg only. Hold this position for 15 seconds. Repeat 2 times. Complete this exercise 2 times a day. Single leg stand Without shoes, stand near a railing or in a doorway. You may hold on to the railing or door frame as needed. Stand on your left / right foot. Keep your big toe down on the floor and try to keep your arch lifted. Do not roll to the outside of your foot. If this exercise is too easy, you can try it with your eyes closed or while standing on a pillow. Hold this position for 15 seconds. Repeat 5 times. Complete this exercise 2 times a day. This information is not intended to replace advice given to you by your health care provider. Make sure you discuss any questions you have with your health care provider. Document Revised: 01/25/2019 Document Reviewed: 01/25/2019 Elsevier Patient Education  2020 [employer].

## 2024-03-03 NOTE — Progress Notes (Signed)
 Subjective:  Patient ID: Crystal Whitaker, female    DOB: 06/22/1966,  MRN: [id number]  Chief Complaint  Patient presents with   Foot Pain    Patient is here for left foot pain. Small heel spur and achilles strain/pain.    Discussed the use of AI scribe software for clinical note transcription with the patient, who gave verbal consent to proceed.  History of Present Illness Crystal Whitaker is a 58 year old female who presents with recurring foot pain.  She has a history of previous peroneal and Achilles tendinitis treated with anti-inflammatories and physical therapy.  This was doing much better.  She experiences recurring pain in her foot that 'comes and goes', primarily located on the bottom of the foot, beginning from the knee and extending downwards. The pain is sometimes described as 'too hard' and at other times subsides. It is most severe in a specific area previously identified as having a small tear in the tendon from an MRI done about a year and a half ago.  No shooting pain down the leg when pressure is applied, although there is pain when certain areas are pressed. Tenderness is noted over the peroneal tendons. No retromalleolar pain or pain with resisted plantar flexion or eversion.  Her current activities include frequent use of a sewing machine, which involves repetitive 'up and down' movements, potentially aggravating her condition. Previous treatment with medications and exercises provided relief for a long time. She is currently not on any medication for this issue but has previously used a steroid and meloxicam , which were effective in managing her symptoms.      Objective:    Physical Exam VASCULAR: DP and PT pulse palpable. Foot is warm and well-perfused. Capillary fill time is brisk. DERMATOLOGIC: Normal skin turgor, texture, and temperature. No open lesions, rashes, or ulcerations. NEUROLOGIC: Normal sensation to light touch and pressure. No paresthesias on  examination. ORTHOPEDIC: Smooth pain-free range of motion of all examined joints. No ecchymosis or bruising. No gross deformity. Tenderness over peroneal tendons at peroneal tubercle. No retromalleolar pain. No pain with resisted plantar flexion or eversion. No pain in posterior Achilles tendon at insertion or midsubstance. No evidence of rupture.   No images are attached to the encounter.    Results RADIOLOGY Left foot X-ray: No acute fracture or stress fracture (03/03/2024)    Assessment:   1. Capsulitis of left foot      Plan:  Patient was evaluated and treated and all questions answered.  Assessment and Plan Assessment & Plan Peroneal tendonitis Chronic peroneal tendonitis with pain originating from the knee and radiating down the leg. Tenderness over the peroneal tendons at the peroneal tubercle. No retromalleolar pain or pain with resisted plantar flexion or eversion. No evidence of rupture. Radiographs show no acute or stress fracture. Pain may be exacerbated by repetitive motion from frequent sewing machine use. Previous treatment with steroids and meloxicam  was effective, and the same regimen is recommended. - Prescribe methylprednisolone  taper - Prescribe meloxicam  - Provide physical therapy exercises - Advise that healing may take up to two months - Instruct to notify if pain persists or worsens - Send prescriptions to preferred pharmacy  Possible peroneal nerve entrapment Possible peroneal nerve entrapment due to radiating pain from the knee down the leg. Differential diagnosis includes nerve entrapment or nerve pain originating from the leg. If pain persists or worsens, further evaluation by neurosurgery may be necessary. Potential nerve entrapment of the peroneal nerve could cause pain to radiate from the  foot upwards. - Instruct to notify if radiating pain persists or worsens - Consider referral to neurosurgery if symptoms do not improve - Document potential nerve  entrapment for future reference      No follow-ups on file.

## 2024-04-11 ENCOUNTER — Encounter: Payer: Self-pay | Admitting: Internal Medicine

## 2024-04-11 ENCOUNTER — Ambulatory Visit: Payer: Self-pay | Attending: Internal Medicine | Admitting: Internal Medicine

## 2024-04-11 DIAGNOSIS — I10 Essential (primary) hypertension: Secondary | ICD-10-CM

## 2024-04-11 DIAGNOSIS — E782 Mixed hyperlipidemia: Secondary | ICD-10-CM

## 2024-04-11 MED ORDER — LISINOPRIL 20 MG PO TABS: 20.0000 mg | ORAL_TABLET | Freq: Every day | ORAL | 1 refills | Status: DC

## 2024-04-11 MED ORDER — HYDROCHLOROTHIAZIDE 25 MG PO TABS: 25.0000 mg | ORAL_TABLET | Freq: Every day | ORAL | 1 refills | Status: DC

## 2024-04-11 NOTE — Progress Notes (Signed)
 Patient ID: Crystal Whitaker, female   DOB: Oct 24, 1965, 58 y.o.   MRN: [id number] Virtual Visit via Video Note  I connected with Crystal Whitaker on 04/11/2024 at 5:13 PM by a video enabled telemedicine application and verified that I am speaking with the correct person using two identifiers.  Location: Patient: home Provider: Office   I discussed the limitations of evaluation and management by telemedicine and the availability of in person appointments. The patient expressed understanding and agreed to proceed.  History of Present Illness: Patient with history of HTN, lower back pain syndrome,  HL  I offered interpreter but pt stated she did not need one.  She spoke english  Discussed the use of AI scribe software for clinical note transcription with the patient, who gave verbal consent to proceed.  History of Present Illness Crystal Whitaker is a 58 year old female with hypertension who presents for a follow-up visit.  HTN/HL:  She takes lisinopril  20 mg daily, hydrochlorothiazide  25 mg daily, and a potassium supplement, adhering to her medication regimen and limiting salt intake. No CP/SOB. She experiences occasional mild leg swelling. She checks her blood pressure intermittently, with the last check being last week, though she does not recall the specific reading. She is on rosuvastatin  for cholesterol management and tolerates it well. Needs RF on BP meds.     Outpatient Encounter Medications as of 04/11/2024  Medication Sig   acetaminophen  (TYLENOL ) 500 MG tablet Take 2 tablets (1,000 mg total) by mouth every 6 (six) hours as needed for moderate pain.   diclofenac  Sodium (VOLTAREN ) 1 % GEL Apply 2 g topically 4 (four) times daily.   hydrochlorothiazide  (HYDRODIURIL ) 25 MG tablet Take 1 tablet (25 mg total) by mouth daily.   lisinopril  (ZESTRIL ) 20 MG tablet Take 1 tablet (20 mg total) by mouth daily.   meloxicam  (MOBIC ) 15 MG tablet Take 1 tablet (15 mg total) by mouth daily.   potassium  chloride (KLOR-CON ) 10 MEQ tablet TAKE 2 TABLETS(20 MEQ) BY MOUTH DAILY   rosuvastatin  (CRESTOR ) 20 MG tablet TAKE 1 TABLET(20 MG) BY MOUTH DAILY FOR CHOLESTEROL   simethicone  (GAS-X) 80 MG chewable tablet Chew 1 tablet (80 mg total) by mouth every 6 (six) hours as needed for flatulence.   triamcinolone  cream (KENALOG ) 0.1 % Apply 1 application topically 2 (two) times daily. To area of skin rash   [DISCONTINUED] hydrochlorothiazide  (HYDRODIURIL ) 25 MG tablet Take 1 tablet (25 mg total) by mouth daily.   [DISCONTINUED] lisinopril  (ZESTRIL ) 20 MG tablet Take 1 tablet (20 mg total) by mouth daily.   [DISCONTINUED] methylPREDNISolone  (MEDROL  DOSEPAK) 4 MG TBPK tablet 6 day dose pack - take as directed   No facility-administered encounter medications on file as of 04/11/2024.      Observations/Objective: Pt was unable to get her camera to turn on   Assessment and Plan: 1. Essential hypertension Pt to continue current meds and low salt diet Advised of blood pressure goal being 130/80 or lower. - hydrochlorothiazide  (HYDRODIURIL ) 25 MG tablet; Take 1 tablet (25 mg total) by mouth daily.  Dispense: 90 tablet; Refill: 1 - lisinopril  (ZESTRIL ) 20 MG tablet; Take 1 tablet (20 mg total) by mouth daily.  Dispense: 90 tablet; Refill: 1  2. Mixed hyperlipidemia (Primary) Continue crestor  20 mg   Follow Up Instructions: 3 months.   I discussed the assessment and treatment plan with the patient. The patient was provided an opportunity to ask questions and all were answered. The patient agreed with the plan and  demonstrated an understanding of the instructions.   The patient was advised to call back or seek an in-person evaluation if the symptoms worsen or if the condition fails to improve as anticipated.  I spent 6 minutes dedicated to the care of this patient on the date of this encounter to include previsit review of chart including my most recent note, face-to-face time with patient discussing  diagnosis and management and post visit entering of orders.  This note has been created with Education officer, environmental. Any transcriptional errors are unintentional.  Barnie Louder, MD

## 2024-04-12 ENCOUNTER — Telehealth: Payer: Self-pay | Admitting: Internal Medicine

## 2024-04-12 NOTE — Telephone Encounter (Signed)
 Called patient, patient did not answer. Left a detailed voicemail for patient to call back to schedule a 3 month follow-up appointment.

## 2024-04-12 NOTE — Telephone Encounter (Signed)
-----   Message from Barnie Louder sent at 04/11/2024  5:27 PM EDT ----- Regarding: appt Needs f/u in 3 mths in person.

## 2024-06-13 ENCOUNTER — Ambulatory Visit (INDEPENDENT_AMBULATORY_CARE_PROVIDER_SITE_OTHER): Admitting: Podiatry

## 2024-06-13 VITALS — Ht 61.0 in | Wt 129.0 lb

## 2024-06-13 DIAGNOSIS — M7672 Peroneal tendinitis, left leg: Secondary | ICD-10-CM | POA: Diagnosis not present

## 2024-06-13 DIAGNOSIS — M25561 Pain in right knee: Secondary | ICD-10-CM | POA: Diagnosis not present

## 2024-06-13 DIAGNOSIS — M25562 Pain in left knee: Secondary | ICD-10-CM | POA: Diagnosis not present

## 2024-06-13 DIAGNOSIS — G8929 Other chronic pain: Secondary | ICD-10-CM | POA: Diagnosis not present

## 2024-06-13 NOTE — Progress Notes (Addendum)
  Subjective:  Patient ID: Crystal Whitaker, female    DOB: [birth year],  MRN: [id number]  Chief Complaint  Patient presents with   Foot Pain    Rm 10 Patient is here as a follow-up on left foot pain. Patient states in the ankle and radiates throughout the left foot.    Discussed the use of AI scribe software for clinical note transcription with the patient, who gave verbal consent to proceed.  History of Present Illness A 58 year old female who presents with left lateral ankle pain and radiating knee pain.  She experiences persistent pain in the left lateral ankle, specifically over the peroneal tendons. The pain was initially evaluated in May by myself of this year and is described as sometimes being severe, improving temporarily for about two hours after treatment but then returning. X-rays taken during the initial visit in May did not show any fracture or enthesopathy.  She also experiences radiating pain from her knees.  She states she has not seen anyone for this.  At the last visit with me I prescribed her methylprednisolone  taper which she took and completed and meloxicam  to take daily.  She is still taking meloxicam  regularly says it helps about 2 hours and then wears off.  I also prescribed her a home physical therapy plan with stretching and range of motion and strengthening exercises of the ankle joint, peroneal tendons, and Achilles. She has done these twice daily for about 8 weeks total and stopped a few weeks ago after they were ineffective.      Objective:    Physical Exam VASCULAR: DP and PT pulse palpable. Foot is warm and well-perfused. Capillary fill time is brisk. DERMATOLOGIC: Normal skin turgor, texture, and temperature. No open lesions, rashes, or ulcerations. NEUROLOGIC: Normal sensation to light touch and pressure. No paresthesias. ORTHOPEDIC: Tenderness of the peroneal tendon, sinus tarsi, and distal malleolus of the left foot. Smooth pain-free range of motion of  all examined joints. No ecchymosis or bruising. No gross deformity.  No instability on exam   No images are attached to the encounter.    Results RADIOLOGY Left ankle X-ray: No fracture or enthesopathy or major degenerative changes in the area of concern (03/03/2024)   Assessment:   1. Peroneal tendinitis of left lower extremity   2. Chronic pain of both knees      Plan:  Patient was evaluated and treated and all questions answered.  Assessment and Plan Assessment & Plan Left lateral ankle pain with possible peroneal tendinopathy Persistent left lateral ankle pain since May, with tenderness over the peroneal tendons. Previous X-rays showed no fracture or enthesopathy. Meloxicam  and home physical therapy have been ineffective in reducing her treatments. MRI recommended to further evaluate the peroneal tendons due to lack of improvement. - Order MRI of the left ankle to evaluate peroneal tendons.  Suspect possible tearing. - Provide phone number for MRI scheduling - Review MRI results in follow-up visit to determine further treatment options such as injection, formal physical therapy, or boot immobilization -Continue meloxicam  as needed  Bilateral knee pain Bilateral knee pain with radiating pain affecting the ankle. Referral to orthopedic surgery recommended to assess for any underlying issues contributing to the pain. - Refer to orthopedic surgery for evaluation of bilateral knee pain      Return for after MRI to review.

## 2024-06-13 NOTE — Patient Instructions (Signed)
 Call [facility] to schedule your MRI at the below locations.  Please allow at least 1 business day after your visit to process the referral.  It may take longer depending on approval from insurance.  Please let me know if you have issues or problems scheduling the MRI   [facility] [phone] [address] [place], [place] [address]  [facility] [phone] [place], [place] [address]    Peroneal Tendinopathy Rehab Ask your health care provider which exercises are safe for you. Do exercises exactly as told by your health care provider and adjust them as directed. It is normal to feel mild stretching, pulling, tightness, or discomfort as you do these exercises. Stop right away if you feel sudden pain or your pain gets worse. Do not begin these exercises until told by your health care provider. Stretching and range-of-motion exercises These exercises warm up your muscles and joints and improve the movement and flexibility of your ankle. These exercises also help to relieve pain and stiffness. Gastroc and soleus stretch, standing  This is an exercise in which you stand on a step and use your body weight to stretch your calf muscles. To do this exercise: Stand on the edge of a step on the ball of your left / right foot. The ball of your foot is on the walking surface, right under your toes. Keep your other foot firmly on the same step. Hold on to the wall, a railing, or a chair for balance. Slowly lift your other foot, allowing your body weight to press your left / right heel down over the edge of the step. You should feel a stretch in your left / right calf (gastrocnemius and soleus). Hold this position for 15 seconds. Return both feet to the step. Repeat this exercise with a slight bend in your left / right knee. Repeat 5 times with your left / right knee straight and 5 times with your left / right knee bent. Complete  this exercise 2 times a day. Strengthening exercises These exercises build strength and endurance in your foot and ankle. Endurance is the ability to use your muscles for a long time, even after they get tired. Ankle dorsiflexion with band   Secure a rubber exercise band or tube to an object, such as a table leg, that will not move when the band is pulled. Secure the other end of the band around your left / right foot. Sit on the floor, facing the object with your left / right leg extended. The band or tube should be slightly tense when your foot is relaxed. Slowly flex your left / right ankle and toes to bring your foot toward you (dorsiflexion). Hold this position for 15 seconds. Let the band or tube slowly pull your foot back to the starting position. Repeat 5 times. Complete this exercise 2 times a day. Ankle eversion Sit on the floor with your legs straight out in front of you. Loop a rubber exercise band or tube around the ball of your left / right foot. The ball of your foot is on the walking surface, right under your toes. Hold the ends of the band in your hands, or secure the band to a stable object. The band or tube should be slightly tense when your foot is relaxed. Slowly push your foot outward, away from your other leg (eversion). Hold this position for 15 seconds. Slowly return your foot to the starting position. Repeat  5 times. Complete this exercise 2 times a day. Plantar flexion, standing  This exercise is sometimes called standing heel raise. Stand with your feet shoulder-width apart. Place your hands on a wall or table to steady yourself as needed, but try not to use it for support. Keep your weight spread evenly over the width of your feet while you slowly rise up on your toes (plantar flexion). If told by your health care provider: Shift your weight toward your left / right leg until you feel challenged. Stand on your left / right leg only. Hold this position for 15  seconds. Repeat 2 times. Complete this exercise 2 times a day. Single leg stand Without shoes, stand near a railing or in a doorway. You may hold on to the railing or door frame as needed. Stand on your left / right foot. Keep your big toe down on the floor and try to keep your arch lifted. Do not roll to the outside of your foot. If this exercise is too easy, you can try it with your eyes closed or while standing on a pillow. Hold this position for 15 seconds. Repeat 5 times. Complete this exercise 2 times a day. This information is not intended to replace advice given to you by your health care provider. Make sure you discuss any questions you have with your health care provider. Document Revised: 01/25/2019 Document Reviewed: 01/25/2019 Elsevier Patient Education  2020 [employer].

## 2024-06-22 ENCOUNTER — Other Ambulatory Visit: Payer: Self-pay

## 2024-06-22 DIAGNOSIS — M7672 Peroneal tendinitis, left leg: Secondary | ICD-10-CM

## 2024-06-22 NOTE — Addendum Note (Signed)
 Addended by: Amandalee Lacap on: 06/22/2024 08:16 AM   Modules accepted: Orders

## 2024-06-23 ENCOUNTER — Inpatient Hospital Stay: Admission: RE | Admit: 2024-06-23 | Payer: Self-pay | Source: Ambulatory Visit

## 2024-06-24 ENCOUNTER — Telehealth: Payer: Self-pay

## 2024-06-24 NOTE — Telephone Encounter (Signed)
 MRI PA was denied

## 2024-07-05 ENCOUNTER — Telehealth: Payer: Self-pay | Admitting: Podiatry

## 2024-07-05 DIAGNOSIS — M7672 Peroneal tendinitis, left leg: Secondary | ICD-10-CM

## 2024-07-05 NOTE — Telephone Encounter (Signed)
 Patient called and would like to change the location of the MRI. She would like it sent to [facility]. She says that she called and they accept her insurance.

## 2024-08-01 ENCOUNTER — Encounter: Payer: Self-pay | Admitting: Podiatry

## 2024-08-03 LAB — AMB RESULTS CONSOLE CBG: Glucose: 108

## 2024-08-03 NOTE — Progress Notes (Signed)
Pt has PCP. Pt declined SDOH.

## 2024-08-18 ENCOUNTER — Ambulatory Visit (INDEPENDENT_AMBULATORY_CARE_PROVIDER_SITE_OTHER): Admitting: Podiatry

## 2024-08-18 VITALS — Ht 61.0 in | Wt 129.0 lb

## 2024-08-18 DIAGNOSIS — G5790 Unspecified mononeuropathy of unspecified lower limb: Secondary | ICD-10-CM | POA: Diagnosis not present

## 2024-08-18 NOTE — Progress Notes (Signed)
  Subjective:  Patient ID: Crystal Whitaker, female    DOB: [birth year],  MRN: [id number]  Chief Complaint  Patient presents with   Foot Problem    Rm 2 Patient is here to f/u on MRI results.    Discussed the use of AI scribe software for clinical note transcription with the patient, who gave verbal consent to proceed.  History of Present Illness A 58 year old female who presents with left lateral ankle pain and radiating knee pain.  She returned for follow-up after completing the MRI      Objective:    Physical Exam VASCULAR: DP and PT pulse palpable. Foot is warm and well-perfused. Capillary fill time is brisk. DERMATOLOGIC: Normal skin turgor, texture, and temperature. No open lesions, rashes, or ulcerations. NEUROLOGIC: Normal sensation to light touch and pressure. No paresthesias. ORTHOPEDIC: Today no pain to palpation of the peroneal tendon sinus tarsi or distal malleolus on either foot. Smooth pain-free range of motion of all examined joints. No ecchymosis or bruising. No gross deformity.  No instability on exam   No images are attached to the encounter.    Results RADIOLOGY Left ankle X-ray: No fracture or enthesopathy or major degenerative changes in the area of concern (03/03/2024)  MRI left ankle completed 07/26/2024 showed no pathology tendons are normal in appearance no edema   Assessment:   1. Neuritis of foot, unspecified laterality      Plan:  Patient was evaluated and treated and all questions answered.  Assessment and Plan Assessment & Plan She returns for follow-up we reviewed the results of her MRI which not show any pathology.  Her symptoms that wax and wane and change I suspect likely is a neurologic pathology either radiculopathy or peripheral neuropathy.  I recommended referral to neurology.  I have no musculoskeletal explanation for the foot and ankle pain at this time.      Return if symptoms worsen or fail to improve.

## 2024-08-19 ENCOUNTER — Ambulatory Visit: Payer: Self-pay

## 2024-08-19 NOTE — Telephone Encounter (Signed)
 Call to patient Earlier today to advise ED unable to reach message left on VM. Patient return call and was advised by NT to go to ED. I agree with disposition per TE patient is patient is refusing ED. Call to patient to reinforce what has been advised by NT unable to reach VM Left.

## 2024-08-19 NOTE — Telephone Encounter (Signed)
 FYI Only or Action Required?: FYI only for provider: ED advised.  Patient was last seen in primary care on 04/11/2024 by Vicci Barnie NOVAK, MD.  Called Nurse Triage reporting Chest Pain.  Symptoms began several weeks ago.  Interventions attempted: Nothing.  Symptoms are: gradually worsening.  Triage Disposition: Go to ED Now (Notify PCP)  Patient/caregiver understands and will follow disposition?: No, wishes to speak with PCP  Copied from CRM #[id number]. Topic: Clinical - Red Word Triage >> Aug 19, 2024  2:21 PM Charlet HERO wrote: Red Word that prompted transfer to Nurse Triage: Patient is calling about pain in her chest close to her heart,also have pain in feet. Dr. Vicci  Reason for Disposition  [1] Chest pain (or angina) comes and goes AND [2] is happening more often (increasing in frequency) or getting worse (increasing in severity)  (Exception: Chest pains that last only a few seconds.)  Answer Assessment - Initial Assessment Questions Pt reports daily episodes of 7/10 CP for past 2 weeks. Denies CP currently. Advised ED, pt declines asking to schedule appt with pcp. Called [facility] to notify of refusal and spoke with RN Cassandra to speak with pt. Attempted to connect pt but they had hung up. Called pt back x1 and LVM.   1. LOCATION: Where does it hurt?       Chest  2. RADIATION: Does the pain go anywhere else? (e.g., into neck, jaw, arms, back)     Also has pain in feet. Denies radiation to jaw or arm  3. ONSET: When did the chest pain begin? (Minutes, hours or days)      2 weeks ago  4. PATTERN: Does the pain come and go, or has it been constant since it started?  Does it get worse with exertion?      Comes and goes, mostly during the morning and daytime  5. DURATION: How long does it last (e.g., seconds, minutes, hours)     Lasts 1-2 minutes about 1x/day  6. SEVERITY: How bad is the pain?  (e.g., Scale 1-10; mild, moderate, or severe)     7/10, sharp  7.  CARDIAC RISK FACTORS: Do you have any history of heart problems or risk factors for heart disease? (e.g., angina, prior heart attack; diabetes, high blood pressure, high cholesterol, smoker, or strong family history of heart disease)     Htn  8. PULMONARY RISK FACTORS: Do you have any history of lung disease?  (e.g., blood clots in lung, asthma, emphysema, birth control pills)     No  9. CAUSE: What do you think is causing the chest pain?     Unsure  10. OTHER SYMPTOMS: Do you have any other symptoms? (e.g., dizziness, nausea, vomiting, sweating, fever, difficulty breathing, cough)       Denies SOB, nausea. Reports headache and fever occasionally. Reports feeling fatigued and lazy  Protocols used: Chest Pain-A-AH

## 2024-08-19 NOTE — Telephone Encounter (Signed)
 FYI Only or Action Required?: Action required by provider: request for appointment.  Patient was last seen in primary care on 04/11/2024 by Vicci Barnie NOVAK, MD.  Called Nurse Triage reporting Hand Pain.  Symptoms began about a month ago.  Interventions attempted: Rest, hydration, or home remedies.  Symptoms are: unchanged.  Triage Disposition: See PCP Within 2 Weeks  Patient/caregiver understands and will follow disposition?: No, wishes to speak with PCP  Copied from CRM #[id number]. Topic: Clinical - Red Word Triage >> Aug 19, 2024 11:54 AM Winona R wrote: Pain in feet and hands, they become stiff sometimes. Would like to schedule an appointment Reason for Disposition  Hand pain is a chronic symptom (recurrent or ongoing AND present > 4 weeks)  Answer Assessment - Initial Assessment Questions Patient refused a medical interpreter. Patient is able to answer questions with some difficulty. Patient reports pain to both hands that have been going on for a month. Patient is asking to be seen in the office for an appointment.    1. ONSET: When did the pain start?     1 month ago 2. LOCATION: Where is the pain located?     Pain is located in fingers 3. PAIN: How bad is the pain? (Scale 1-10; or mild, moderate, severe)     7 out of 10 4. WORK OR EXERCISE: Has there been any recent work or exercise that involved this part (i.e., hand or wrist) of the body?     no 5. CAUSE: What do you think is causing the pain?     unsure 6. AGGRAVATING FACTORS: What makes the pain worse? (e.g., using computer)     Patient reports using cold water can make the pain worse or trying to grab something with her hands.  7. OTHER SYMPTOMS: Do you have any other symptoms? (e.g., fever, neck pain, numbness or tingling, rash, swelling)     no  Protocols used: Hand Pain-A-AH

## 2024-08-19 NOTE — Telephone Encounter (Signed)
 Call to patient with Interperter [phone] no answer VM left

## 2024-08-26 ENCOUNTER — Ambulatory Visit: Payer: Self-pay

## 2024-08-26 NOTE — Telephone Encounter (Signed)
Please call patient and get her scheduled

## 2024-08-26 NOTE — Telephone Encounter (Signed)
 FYI Only or Action Required?: Action required by provider: request for appointment.  Patient was last seen in primary care on 04/11/2024 by Vicci Barnie NOVAK, MD.  Called Nurse Triage reporting Chest Pain.  Symptoms began unsure.  Interventions attempted: Nothing.  Symptoms are: unchanged.  Triage Disposition: See Physician Within 24 Hours  Patient/caregiver understands and will follow disposition?: No, wishes to speak with PCP    Copied from CRM #[id number]. Topic: Clinical - Red Word Triage >> Aug 26, 2024  2:29 PM Mia F wrote: Red Word that prompted transfer to Nurse Triage: Pt says she has had high blood pressure but unable to say what the readings were. She says she is having headaches and chest pains. No other symptoms. Reason for Disposition  [1] Chest pain lasts > 5 minutes AND [2] occurred > 3 days ago (72 hours) AND [3] NO chest pain or cardiac symptoms now  Answer Assessment - Initial Assessment Questions Interpreter used. Pt states that the high Bp has been ongoing. She did not check it today but yesterday was 170/80. She also mentioned to PAS chest pain and headache. She states the headaches are 1-2 times a week. Nt constant, no trigger or schedule found. Mild and sometimes go away quickly. She also states she has chest pain and when she has the chest pain she also has left arm pain. RN advised at that point for patient to go to the ER. She states she isn't having any symptoms currently, no chest pain, no hand pain, no headache. She just wants to set up an appt to discuss. Interpreter rechecked with patient twice that she is not currently having any chest pain. Rn looked at schedule and nothing until December. RN advised if chest pain returns even slightly for patient to go to the ER for evaluation. Sending to Dr. Ferdie team for assistance in trying to get her scheduled sooner to discuss high BP.   1. LOCATION: Where does it hurt?       Chest pain 2. RADIATION: Does the  pain go anywhere else? (e.g., into neck, jaw, arms, back)     Left hand 3. ONSET: When did the chest pain begin? (Minutes, hours or days)      New symptoms couldn't really say when it started but did say non currently 4. PATTERN: Does the pain come and go, or has it been constant since it started?  Does it get worse with exertion?      intermittent 5. DURATION: How long does it last (e.g., seconds, minutes, hours)     unknown 6. SEVERITY: How bad is the pain?  (e.g., Scale 1-10; mild, moderate, or severe)     States the hand pain is worse than the chest pain 7. CARDIAC RISK FACTORS: Do you have any history of heart problems or risk factors for heart disease? (e.g., angina, prior heart attack; diabetes, high blood pressure, high cholesterol, smoker, or strong family history of heart disease)     HTN   10. OTHER SYMPTOMS: Do you have any other symptoms? (e.g., dizziness, nausea, vomiting, sweating, fever, difficulty breathing, cough)       denies  Protocols used: Chest Pain-A-AH

## 2024-09-12 NOTE — Progress Notes (Addendum)
 The patient attended a screening event on 08/03/2024 where her BP screening results was 146/81, fasting blood glucose 108. At the event the patient noted she has [employer] and does not smoke. Patient declined having any SDOH insecurities at the screening event. Pt listed pcp as Dr. Barnie KATHEE Louder MD.   Per chart review pt has a pcp and the last office visit was 11/16/2023 for essential hypertension. The pt BP was 123/76 on 11/16/2023. According to chart pt is currently on rosuvastatin  20 mg every day for the past 3 months and pt is currently on lisinopril  and hydrochlorothiazide  to manage blood pressure. Chart review also indicated that pt is being seen by podiatry.  No additional [employer] team support indicated at this time.

## 2024-11-13 ENCOUNTER — Encounter: Payer: Self-pay | Admitting: Internal Medicine

## 2024-11-18 ENCOUNTER — Other Ambulatory Visit: Payer: Self-pay | Admitting: Internal Medicine

## 2024-11-18 DIAGNOSIS — I10 Essential (primary) hypertension: Secondary | ICD-10-CM

## 2024-11-21 ENCOUNTER — Other Ambulatory Visit: Payer: Self-pay | Admitting: Internal Medicine

## 2024-11-21 DIAGNOSIS — I10 Essential (primary) hypertension: Secondary | ICD-10-CM

## 2024-12-16 ENCOUNTER — Ambulatory Visit: Payer: Self-pay | Admitting: Internal Medicine
